# Patient Record
Sex: Male | Born: 1945 | Race: Black or African American | Hispanic: No | Smoking: Former smoker
Health system: Southern US, Community
[De-identification: ages and names within clinical notes are randomized; demographics above are authoritative.]

## PROBLEM LIST (undated history)

## (undated) DIAGNOSIS — G709 Myoneural disorder, unspecified: Secondary | ICD-10-CM

## (undated) DIAGNOSIS — E7800 Pure hypercholesterolemia, unspecified: Secondary | ICD-10-CM

## (undated) DIAGNOSIS — M1990 Unspecified osteoarthritis, unspecified site: Secondary | ICD-10-CM

## (undated) DIAGNOSIS — E119 Type 2 diabetes mellitus without complications: Secondary | ICD-10-CM

## (undated) DIAGNOSIS — I10 Essential (primary) hypertension: Secondary | ICD-10-CM

## (undated) HISTORY — PX: INGUINAL HERNIA REPAIR: SUR1180

## (undated) HISTORY — PX: JOINT REPLACEMENT: SHX530

---

## 1969-03-19 HISTORY — PX: EXCISIONAL HEMORRHOIDECTOMY: SHX1541

## 1997-07-19 HISTORY — PX: TOTAL HIP ARTHROPLASTY: SHX124

## 2000-05-25 ENCOUNTER — Emergency Department (HOSPITAL_COMMUNITY): Admission: EM | Admit: 2000-05-25 | Discharge: 2000-05-25 | Payer: Self-pay | Admitting: Emergency Medicine

## 2000-06-06 ENCOUNTER — Emergency Department (HOSPITAL_COMMUNITY): Admission: EM | Admit: 2000-06-06 | Discharge: 2000-06-06 | Payer: Self-pay | Admitting: Emergency Medicine

## 2002-12-18 ENCOUNTER — Encounter: Admission: RE | Admit: 2002-12-18 | Discharge: 2003-03-18 | Payer: Self-pay | Admitting: Family Medicine

## 2006-03-01 ENCOUNTER — Inpatient Hospital Stay (HOSPITAL_COMMUNITY): Admission: RE | Admit: 2006-03-01 | Discharge: 2006-03-03 | Payer: Self-pay | Admitting: Neurosurgery

## 2006-05-23 ENCOUNTER — Encounter: Admission: RE | Admit: 2006-05-23 | Discharge: 2006-05-23 | Payer: Self-pay | Admitting: Orthopedic Surgery

## 2006-07-19 HISTORY — PX: ANTERIOR CERVICAL DECOMP/DISCECTOMY FUSION: SHX1161

## 2011-11-15 ENCOUNTER — Other Ambulatory Visit: Payer: Self-pay | Admitting: Neurosurgery

## 2011-11-15 DIAGNOSIS — M5000 Cervical disc disorder with myelopathy, unspecified cervical region: Secondary | ICD-10-CM

## 2011-11-18 ENCOUNTER — Ambulatory Visit
Admission: RE | Admit: 2011-11-18 | Discharge: 2011-11-18 | Disposition: A | Discharge status: Home / Self Care | Payer: Medicare Other | Source: Ambulatory Visit | Attending: Neurosurgery | Admitting: Neurosurgery

## 2011-11-18 DIAGNOSIS — M5000 Cervical disc disorder with myelopathy, unspecified cervical region: Secondary | ICD-10-CM

## 2011-11-24 ENCOUNTER — Other Ambulatory Visit: Payer: Self-pay | Admitting: Neurosurgery

## 2011-11-24 DIAGNOSIS — M5000 Cervical disc disorder with myelopathy, unspecified cervical region: Secondary | ICD-10-CM

## 2011-11-25 ENCOUNTER — Other Ambulatory Visit: Payer: Self-pay | Admitting: Neurosurgery

## 2011-11-25 DIAGNOSIS — M5000 Cervical disc disorder with myelopathy, unspecified cervical region: Secondary | ICD-10-CM

## 2011-11-29 ENCOUNTER — Ambulatory Visit
Admission: RE | Admit: 2011-11-29 | Discharge: 2011-11-29 | Disposition: A | Discharge status: Home / Self Care | Payer: Medicare Other | Source: Ambulatory Visit | Attending: Neurosurgery | Admitting: Neurosurgery

## 2011-11-29 DIAGNOSIS — M5000 Cervical disc disorder with myelopathy, unspecified cervical region: Secondary | ICD-10-CM

## 2011-12-06 ENCOUNTER — Other Ambulatory Visit: Payer: Self-pay | Admitting: Neurosurgery

## 2011-12-21 ENCOUNTER — Encounter (HOSPITAL_COMMUNITY): Payer: Self-pay | Admitting: *Deleted

## 2011-12-21 ENCOUNTER — Inpatient Hospital Stay (HOSPITAL_COMMUNITY): Admission: RE | Admit: 2011-12-21 | Payer: Medicare Other | Source: Ambulatory Visit

## 2011-12-21 ENCOUNTER — Encounter (HOSPITAL_COMMUNITY): Payer: Self-pay | Admitting: Pharmacy Technician

## 2011-12-21 NOTE — Pre-Procedure Instructions (Signed)
20 TULLIO CHAUSSE  12/21/2011   Your procedure is scheduled on:  Tuesday, June 18th. :30 Report to [facility] at 5:30 AM.  Call this number if you have problems the morning of surgery: [phone]   Remember:   Do not eat food:After Midnight.  May have clear liquids: up to 4 Hours before arrival. (1:30am)  Clear liquids include soda, tea, black coffee, apple or grape juice, broth.   Take these medicines the morning of surgery with A SIP OF WATER:None   Do not wear jewelry, make-up or nail polish.  Do not wear lotions, powders, or perfumes. You may wear deodorant.  Do not shave 48 hours prior to surgery. Men may shave face and neck.  Do not bring valuables to the hospital.  Contacts, dentures or bridgework may not be worn into surgery.  Leave suitcase in the car. After surgery it may be brought to your room.  For patients admitted to the hospital, checkout time is 11:00 AM the day of discharge.   Patients discharged the day of surgery will not be allowed to drive home.  Name and phone number of your driver: NA  Special Instructions: CHG Shower Use Special Wash: 1/2 bottle night before surgery and 1/2 bottle morning of surgery.   Please read over the following fact sheets that you were given: Pain Booklet, Coughing and Deep Breathing, Blood Transfusion Information, MRSA Information and Surgical Site Infection Prevention

## 2011-12-28 ENCOUNTER — Ambulatory Visit (HOSPITAL_COMMUNITY)
Admission: RE | Admit: 2011-12-28 | Discharge: 2011-12-28 | Disposition: A | Discharge status: Home / Self Care | Payer: Medicare Other | Source: Ambulatory Visit | Attending: Neurosurgery | Admitting: Neurosurgery

## 2011-12-28 ENCOUNTER — Encounter (HOSPITAL_COMMUNITY)
Admission: RE | Admit: 2011-12-28 | Discharge: 2011-12-28 | Disposition: A | Discharge status: Home / Self Care | Payer: Medicare Other | Source: Ambulatory Visit | Attending: Neurosurgery | Admitting: Neurosurgery

## 2011-12-28 DIAGNOSIS — Z01818 Encounter for other preprocedural examination: Secondary | ICD-10-CM | POA: Insufficient documentation

## 2011-12-28 DIAGNOSIS — Z01812 Encounter for preprocedural laboratory examination: Secondary | ICD-10-CM | POA: Insufficient documentation

## 2011-12-28 LAB — TYPE AND SCREEN
ABO/RH(D): A POS
Antibody Screen: NEGATIVE

## 2011-12-28 LAB — SURGICAL PCR SCREEN: Staphylococcus aureus: NEGATIVE

## 2011-12-28 LAB — BASIC METABOLIC PANEL
BUN: 14 mg/dL (ref 6–23)
Calcium: 9.9 mg/dL (ref 8.4–10.5)
Chloride: 103 mEq/L (ref 96–112)
Creatinine, Ser: 0.71 mg/dL (ref 0.50–1.35)
GFR calc Af Amer: >90 mL/min (ref >90)
GFR calc non Af Amer: >90 mL/min (ref >90)

## 2011-12-28 LAB — CBC
HCT: 40.7 % (ref 39.0–52.0)
MCHC: 33.4 g/dL (ref 30.0–36.0)
Platelets: 262 10*3/uL (ref 150–400)
RDW: 14 % (ref 11.5–15.5)

## 2012-01-04 ENCOUNTER — Inpatient Hospital Stay (HOSPITAL_COMMUNITY): Payer: Medicare Other

## 2012-01-04 ENCOUNTER — Inpatient Hospital Stay (HOSPITAL_COMMUNITY): Payer: Medicare Other | Admitting: Certified Registered"

## 2012-01-04 ENCOUNTER — Encounter (HOSPITAL_COMMUNITY)
Admission: RE | Disposition: A | Discharge status: Rehabilitation Facility | Payer: Self-pay | Source: Ambulatory Visit | Attending: Neurosurgery

## 2012-01-04 ENCOUNTER — Inpatient Hospital Stay: Admit: 2012-01-04 | Payer: Self-pay | Admitting: Neurosurgery

## 2012-01-04 ENCOUNTER — Encounter (HOSPITAL_COMMUNITY): Payer: Self-pay | Admitting: *Deleted

## 2012-01-04 ENCOUNTER — Encounter (HOSPITAL_COMMUNITY): Payer: Self-pay | Admitting: Certified Registered"

## 2012-01-04 ENCOUNTER — Inpatient Hospital Stay (HOSPITAL_COMMUNITY)
Admission: RE | Admit: 2012-01-04 | Discharge: 2012-01-12 | DRG: 471 | Disposition: A | Discharge status: Rehabilitation Facility | Payer: Medicare Other | Source: Ambulatory Visit | Attending: Neurosurgery | Admitting: Neurosurgery

## 2012-01-04 DIAGNOSIS — J95821 Acute postprocedural respiratory failure: Secondary | ICD-10-CM | POA: Diagnosis not present

## 2012-01-04 DIAGNOSIS — Z96649 Presence of unspecified artificial hip joint: Secondary | ICD-10-CM

## 2012-01-04 DIAGNOSIS — Z87891 Personal history of nicotine dependence: Secondary | ICD-10-CM

## 2012-01-04 DIAGNOSIS — Y9210 Unspecified residential institution as the place of occurrence of the external cause: Secondary | ICD-10-CM | POA: Diagnosis not present

## 2012-01-04 DIAGNOSIS — E875 Hyperkalemia: Secondary | ICD-10-CM

## 2012-01-04 DIAGNOSIS — Z981 Arthrodesis status: Secondary | ICD-10-CM

## 2012-01-04 DIAGNOSIS — E119 Type 2 diabetes mellitus without complications: Secondary | ICD-10-CM | POA: Diagnosis present

## 2012-01-04 DIAGNOSIS — R0902 Hypoxemia: Secondary | ICD-10-CM

## 2012-01-04 DIAGNOSIS — IMO0002 Reserved for concepts with insufficient information to code with codable children: Secondary | ICD-10-CM | POA: Diagnosis not present

## 2012-01-04 DIAGNOSIS — Y831 Surgical operation with implant of artificial internal device as the cause of abnormal reaction of the patient, or of later complication, without mention of misadventure at the time of the procedure: Secondary | ICD-10-CM | POA: Diagnosis not present

## 2012-01-04 DIAGNOSIS — E785 Hyperlipidemia, unspecified: Secondary | ICD-10-CM | POA: Diagnosis present

## 2012-01-04 DIAGNOSIS — I10 Essential (primary) hypertension: Secondary | ICD-10-CM | POA: Diagnosis present

## 2012-01-04 DIAGNOSIS — Z79899 Other long term (current) drug therapy: Secondary | ICD-10-CM

## 2012-01-04 DIAGNOSIS — Z23 Encounter for immunization: Secondary | ICD-10-CM

## 2012-01-04 DIAGNOSIS — R531 Weakness: Secondary | ICD-10-CM

## 2012-01-04 DIAGNOSIS — M4712 Other spondylosis with myelopathy, cervical region: Principal | ICD-10-CM | POA: Diagnosis present

## 2012-01-04 HISTORY — DX: Myoneural disorder, unspecified: G70.9

## 2012-01-04 HISTORY — DX: Pure hypercholesterolemia, unspecified: E78.00

## 2012-01-04 HISTORY — DX: Essential (primary) hypertension: I10

## 2012-01-04 HISTORY — PX: POSTERIOR FUSION CERVICAL SPINE: SUR628

## 2012-01-04 HISTORY — PX: POSTERIOR CERVICAL LAMINECTOMY: SHX2248

## 2012-01-04 HISTORY — DX: Unspecified osteoarthritis, unspecified site: M19.90

## 2012-01-04 HISTORY — DX: Type 2 diabetes mellitus without complications: E11.9

## 2012-01-04 LAB — GLUCOSE, CAPILLARY
Glucose-Capillary: 131 mg/dL — ABNORMAL HIGH (ref 70–99)
Glucose-Capillary: 137 mg/dL — ABNORMAL HIGH (ref 70–99)

## 2012-01-04 SURGERY — POSTERIOR CERVICAL LAMINECTOMY
Anesthesia: General | Site: Spine Cervical | Wound class: Clean

## 2012-01-04 MED ORDER — LACTATED RINGERS IV SOLN
INTRAVENOUS | Status: DC | PRN
  Administered 2012-01-04 (×4): via INTRAVENOUS

## 2012-01-04 MED ORDER — GLIPIZIDE 5 MG PO TABS
5.0000 mg | ORAL_TABLET | Freq: Two times a day (BID) | ORAL | Status: DC
  Administered 2012-01-04 – 2012-01-09 (×10): 5 mg via ORAL
  Filled 2012-01-04 (×12): qty 1

## 2012-01-04 MED ORDER — SODIUM CHLORIDE 0.9 % IJ SOLN
3.0000 mL | Freq: Two times a day (BID) | INTRAMUSCULAR | Status: DC
  Administered 2012-01-05: 10:00:00 via INTRAVENOUS
  Administered 2012-01-05 – 2012-01-09 (×8): 3 mL via INTRAVENOUS

## 2012-01-04 MED ORDER — SODIUM CHLORIDE 0.9 % IV SOLN
INTRAVENOUS | Status: AC
  Filled 2012-01-04: qty 500

## 2012-01-04 MED ORDER — HYDROMORPHONE HCL PF 1 MG/ML IJ SOLN: 0.2500 mg | INTRAMUSCULAR | Status: DC | PRN

## 2012-01-04 MED ORDER — PROMETHAZINE HCL 25 MG PO TABS: 12.5000 mg | ORAL_TABLET | ORAL | Status: DC | PRN

## 2012-01-04 MED ORDER — ONDANSETRON HCL 4 MG/2ML IJ SOLN
INTRAMUSCULAR | Status: DC | PRN
  Administered 2012-01-04: 4 mg via INTRAVENOUS

## 2012-01-04 MED ORDER — MAGNESIUM HYDROXIDE 400 MG/5ML PO SUSP
30.0000 mL | Freq: Every day | ORAL | Status: DC | PRN
  Administered 2012-01-07: 30 mL via ORAL
  Filled 2012-01-04: qty 30

## 2012-01-04 MED ORDER — METHOCARBAMOL 500 MG PO TABS
500.0000 mg | ORAL_TABLET | Freq: Four times a day (QID) | ORAL | Status: DC | PRN
  Administered 2012-01-06: 500 mg via ORAL
  Filled 2012-01-04 (×2): qty 1

## 2012-01-04 MED ORDER — HEMOSTATIC AGENTS (NO CHARGE) OPTIME
TOPICAL | Status: DC | PRN
  Administered 2012-01-04 (×2): 1 via TOPICAL

## 2012-01-04 MED ORDER — 0.9 % SODIUM CHLORIDE (POUR BTL) OPTIME
TOPICAL | Status: DC | PRN
  Administered 2012-01-04: 1000 mL

## 2012-01-04 MED ORDER — EPHEDRINE SULFATE 50 MG/ML IJ SOLN
INTRAMUSCULAR | Status: DC | PRN
  Administered 2012-01-04 (×3): 10 mg via INTRAVENOUS

## 2012-01-04 MED ORDER — POTASSIUM CHLORIDE IN NACL 20-0.45 MEQ/L-% IV SOLN
INTRAVENOUS | Status: DC
  Administered 2012-01-04 – 2012-01-05 (×2): via INTRAVENOUS
  Filled 2012-01-04 (×14): qty 1000

## 2012-01-04 MED ORDER — CYCLOBENZAPRINE HCL 10 MG PO TABS
10.0000 mg | ORAL_TABLET | Freq: Three times a day (TID) | ORAL | Status: DC | PRN
  Administered 2012-01-11 – 2012-01-12 (×3): 10 mg via ORAL
  Filled 2012-01-04 (×3): qty 1

## 2012-01-04 MED ORDER — SODIUM CHLORIDE 0.9 % IJ SOLN
3.0000 mL | INTRAMUSCULAR | Status: DC | PRN
  Administered 2012-01-09: 3 mL via INTRAVENOUS

## 2012-01-04 MED ORDER — MIDAZOLAM HCL 5 MG/5ML IJ SOLN
INTRAMUSCULAR | Status: DC | PRN
  Administered 2012-01-04: 2 mg via INTRAVENOUS

## 2012-01-04 MED ORDER — LIDOCAINE-EPINEPHRINE 1 %-1:100000 IJ SOLN
INTRAMUSCULAR | Status: DC | PRN
  Administered 2012-01-04: 20 mL

## 2012-01-04 MED ORDER — CEFAZOLIN SODIUM 1-5 GM-% IV SOLN
INTRAVENOUS | Status: AC
  Filled 2012-01-04: qty 50

## 2012-01-04 MED ORDER — SIMVASTATIN 20 MG PO TABS
20.0000 mg | ORAL_TABLET | Freq: Every day | ORAL | Status: DC
  Administered 2012-01-04 – 2012-01-11 (×7): 20 mg via ORAL
  Filled 2012-01-04 (×9): qty 1

## 2012-01-04 MED ORDER — THROMBIN 5000 UNITS EX KIT
PACK | CUTANEOUS | Status: DC | PRN
  Administered 2012-01-04 (×4): 5000 [IU] via TOPICAL

## 2012-01-04 MED ORDER — ONDANSETRON HCL 4 MG/2ML IJ SOLN: 4.0000 mg | Freq: Four times a day (QID) | INTRAMUSCULAR | Status: DC | PRN

## 2012-01-04 MED ORDER — BACITRACIN 50000 UNITS IM SOLR
INTRAMUSCULAR | Status: AC
  Filled 2012-01-04: qty 1

## 2012-01-04 MED ORDER — MORPHINE SULFATE (PF) 1 MG/ML IV SOLN
INTRAVENOUS | Status: DC
  Administered 2012-01-04: 14:00:00 via INTRAVENOUS
  Administered 2012-01-04: 7.5 mg via INTRAVENOUS
  Administered 2012-01-04: 1.7 mg via INTRAVENOUS
  Administered 2012-01-04: 3 mg via INTRAVENOUS
  Administered 2012-01-05: 7.5 mg via INTRAVENOUS

## 2012-01-04 MED ORDER — LIDOCAINE HCL (CARDIAC) 20 MG/ML IV SOLN
INTRAVENOUS | Status: DC | PRN
  Administered 2012-01-04: 80 mg via INTRAVENOUS

## 2012-01-04 MED ORDER — CEFAZOLIN SODIUM 1-5 GM-% IV SOLN
1.0000 g | Freq: Three times a day (TID) | INTRAVENOUS | Status: AC
  Administered 2012-01-04 – 2012-01-05 (×4): 1 g via INTRAVENOUS
  Filled 2012-01-04 (×4): qty 50

## 2012-01-04 MED ORDER — PHENYLEPHRINE HCL 10 MG/ML IJ SOLN
INTRAMUSCULAR | Status: DC | PRN
  Administered 2012-01-04 (×11): 80 ug via INTRAVENOUS

## 2012-01-04 MED ORDER — ROCURONIUM BROMIDE 100 MG/10ML IV SOLN
INTRAVENOUS | Status: DC | PRN
  Administered 2012-01-04: 50 mg via INTRAVENOUS
  Administered 2012-01-04: 10 mg via INTRAVENOUS

## 2012-01-04 MED ORDER — BISACODYL 10 MG RE SUPP: 10.0000 mg | Freq: Every day | RECTAL | Status: DC | PRN

## 2012-01-04 MED ORDER — SODIUM CHLORIDE 0.9 % IR SOLN
Status: DC | PRN
  Administered 2012-01-04: 10:00:00

## 2012-01-04 MED ORDER — INSULIN ASPART 100 UNIT/ML ~~LOC~~ SOLN
0.0000 [IU] | SUBCUTANEOUS | Status: DC
Start: 2012-01-04 — End: 2012-01-05
  Administered 2012-01-04 (×2): 2 [IU] via SUBCUTANEOUS
  Administered 2012-01-05 (×3): 3 [IU] via SUBCUTANEOUS
  Filled 2012-01-04: qty 3
  Filled 2012-01-04: qty 0.15

## 2012-01-04 MED ORDER — BACITRACIN ZINC 500 UNIT/GM EX OINT
TOPICAL_OINTMENT | CUTANEOUS | Status: DC | PRN
  Administered 2012-01-04: 1 via TOPICAL

## 2012-01-04 MED ORDER — LISINOPRIL 10 MG PO TABS
10.0000 mg | ORAL_TABLET | Freq: Every day | ORAL | Status: DC
  Administered 2012-01-04 – 2012-01-09 (×6): 10 mg via ORAL
  Filled 2012-01-04 (×6): qty 1

## 2012-01-04 MED ORDER — DIPHENHYDRAMINE HCL 12.5 MG/5ML PO ELIX: 12.5000 mg | ORAL_SOLUTION | Freq: Four times a day (QID) | ORAL | Status: DC | PRN

## 2012-01-04 MED ORDER — CEFAZOLIN SODIUM 1-5 GM-% IV SOLN
INTRAVENOUS | Status: DC | PRN
  Administered 2012-01-04 (×2): 1 g via INTRAVENOUS

## 2012-01-04 MED ORDER — DIAZEPAM 5 MG PO TABS
5.0000 mg | ORAL_TABLET | Freq: Four times a day (QID) | ORAL | Status: DC | PRN
  Administered 2012-01-05 – 2012-01-09 (×7): 5 mg via ORAL
  Filled 2012-01-04 (×7): qty 1

## 2012-01-04 MED ORDER — LIDOCAINE HCL 4 % MT SOLN
OROMUCOSAL | Status: DC | PRN
  Administered 2012-01-04: 4 mL via TOPICAL

## 2012-01-04 MED ORDER — METHOCARBAMOL 100 MG/ML IJ SOLN
500.0000 mg | Freq: Four times a day (QID) | INTRAVENOUS | Status: DC | PRN
  Filled 2012-01-04: qty 5

## 2012-01-04 MED ORDER — METFORMIN HCL 500 MG PO TABS
1000.0000 mg | ORAL_TABLET | Freq: Two times a day (BID) | ORAL | Status: DC
  Administered 2012-01-04 – 2012-01-09 (×10): 1000 mg via ORAL
  Filled 2012-01-04 (×12): qty 2

## 2012-01-04 MED ORDER — SUFENTANIL CITRATE 50 MCG/ML IV SOLN
INTRAVENOUS | Status: DC | PRN
  Administered 2012-01-04: 15 ug via INTRAVENOUS
  Administered 2012-01-04 (×2): 10 ug via INTRAVENOUS
  Administered 2012-01-04: 5 ug via INTRAVENOUS
  Administered 2012-01-04: 10 ug via INTRAVENOUS

## 2012-01-04 MED ORDER — NEOSTIGMINE METHYLSULFATE 1 MG/ML IJ SOLN
INTRAMUSCULAR | Status: DC | PRN
  Administered 2012-01-04: 5 mg via INTRAVENOUS

## 2012-01-04 MED ORDER — SODIUM CHLORIDE 0.9 % IV SOLN: 250.0000 mL | INTRAVENOUS | Status: DC

## 2012-01-04 MED ORDER — KETOROLAC TROMETHAMINE 30 MG/ML IJ SOLN
30.0000 mg | Freq: Four times a day (QID) | INTRAMUSCULAR | Status: AC
  Administered 2012-01-04 – 2012-01-05 (×4): 30 mg via INTRAVENOUS
  Filled 2012-01-04 (×3): qty 1

## 2012-01-04 MED ORDER — KETOROLAC TROMETHAMINE 30 MG/ML IJ SOLN
INTRAMUSCULAR | Status: AC
  Filled 2012-01-04: qty 1

## 2012-01-04 MED ORDER — ZOLPIDEM TARTRATE 5 MG PO TABS: 5.0000 mg | ORAL_TABLET | Freq: Every evening | ORAL | Status: DC | PRN

## 2012-01-04 MED ORDER — MORPHINE SULFATE (PF) 1 MG/ML IV SOLN
INTRAVENOUS | Status: AC
  Filled 2012-01-04: qty 25

## 2012-01-04 MED ORDER — NALOXONE HCL 0.4 MG/ML IJ SOLN: 0.4000 mg | INTRAMUSCULAR | Status: DC | PRN

## 2012-01-04 MED ORDER — GLYCOPYRROLATE 0.2 MG/ML IJ SOLN
INTRAMUSCULAR | Status: DC | PRN
  Administered 2012-01-04: .8 mg via INTRAVENOUS

## 2012-01-04 MED ORDER — ACETAMINOPHEN 325 MG PO TABS: 650.0000 mg | ORAL_TABLET | ORAL | Status: DC | PRN

## 2012-01-04 MED ORDER — PROPOFOL 10 MG/ML IV EMUL
INTRAVENOUS | Status: DC | PRN
  Administered 2012-01-04: 160 mg via INTRAVENOUS
  Administered 2012-01-04: 40 mg via INTRAVENOUS

## 2012-01-04 MED ORDER — ONDANSETRON HCL 4 MG/2ML IJ SOLN: 4.0000 mg | INTRAMUSCULAR | Status: DC | PRN

## 2012-01-04 MED ORDER — DOCUSATE SODIUM 100 MG PO CAPS
100.0000 mg | ORAL_CAPSULE | Freq: Two times a day (BID) | ORAL | Status: DC
  Administered 2012-01-04 – 2012-01-12 (×13): 100 mg via ORAL
  Filled 2012-01-04 (×14): qty 1

## 2012-01-04 MED ORDER — PROMETHAZINE HCL 25 MG/ML IJ SOLN
12.5000 mg | INTRAMUSCULAR | Status: DC | PRN
  Filled 2012-01-04: qty 1

## 2012-01-04 MED ORDER — ACETAMINOPHEN 650 MG RE SUPP: 650.0000 mg | RECTAL | Status: DC | PRN

## 2012-01-04 MED ORDER — SODIUM CHLORIDE 0.9 % IJ SOLN: 9.0000 mL | INTRAMUSCULAR | Status: DC | PRN

## 2012-01-04 MED ORDER — DIPHENHYDRAMINE HCL 50 MG/ML IJ SOLN: 12.5000 mg | Freq: Four times a day (QID) | INTRAMUSCULAR | Status: DC | PRN

## 2012-01-04 SURGICAL SUPPLY — 79 items
150 MM ROD TEMPLATE ×1 IMPLANT
APL SKNCLS STERI-STRIP NONHPOA (GAUZE/BANDAGES/DRESSINGS) ×1
BAG DECANTER FOR FLEXI CONT (MISCELLANEOUS) ×2 IMPLANT
BENZOIN TINCTURE PRP APPL 2/3 (GAUZE/BANDAGES/DRESSINGS) ×2 IMPLANT
BIT DRILL FIX DEPTH 18MM (DRILL) IMPLANT
BIT DRILL NEURO 2X3.1 SFT TUCH (MISCELLANEOUS) ×1 IMPLANT
BRUSH SCRUB EZ 1% IODOPHOR (MISCELLANEOUS) ×1 IMPLANT
BUR ROUND FLUTED 4 SOFT TCH (BURR) ×1 IMPLANT
CANISTER SUCTION 2500CC (MISCELLANEOUS) ×2 IMPLANT
CLOTH BEACON ORANGE TIMEOUT ST (SAFETY) ×2 IMPLANT
CONT SPEC 4OZ CLIKSEAL STRL BL (MISCELLANEOUS) ×3 IMPLANT
DRAIN SNY WOU 7FLT (WOUND CARE) IMPLANT
DRAPE C-ARM 42X72 X-RAY (DRAPES) ×2 IMPLANT
DRAPE C-ARMOR (DRAPES) ×1 IMPLANT
DRAPE LAPAROTOMY 100X72 PEDS (DRAPES) ×2 IMPLANT
DRAPE MICROSCOPE LEICA (MISCELLANEOUS) IMPLANT
DRAPE ORTHO SPLIT 77X108 STRL (DRAPES)
DRAPE POUCH INSTRU U-SHP 10X18 (DRAPES) ×2 IMPLANT
DRAPE SURG ORHT 6 SPLT 77X108 (DRAPES) IMPLANT
DRESSING TELFA 8X3 (GAUZE/BANDAGES/DRESSINGS) ×1 IMPLANT
DRILL 12MM (DRILL) ×1 IMPLANT
DRILL 16MM (DRILL) ×1 IMPLANT
DRILL FIX DEPTH 18MM (DRILL) ×2
DRILL NEURO 2X3.1 SOFT TOUCH (MISCELLANEOUS) ×2
DURAPREP 26ML APPLICATOR (WOUND CARE) ×1 IMPLANT
DURAPREP 6ML APPLICATOR 50/CS (WOUND CARE) ×1 IMPLANT
ELECT REM PT RETURN 9FT ADLT (ELECTROSURGICAL) ×2
ELECTRODE REM PT RTRN 9FT ADLT (ELECTROSURGICAL) ×1 IMPLANT
EVACUATOR SILICONE 100CC (DRAIN) IMPLANT
GAUZE SPONGE 4X4 16PLY XRAY LF (GAUZE/BANDAGES/DRESSINGS) IMPLANT
GLOVE ECLIPSE 7.5 STRL STRAW (GLOVE) ×4 IMPLANT
GLOVE ECLIPSE 8.5 STRL (GLOVE) ×1 IMPLANT
GLOVE EXAM NITRILE LRG STRL (GLOVE) IMPLANT
GLOVE EXAM NITRILE MD LF STRL (GLOVE) ×1 IMPLANT
GLOVE EXAM NITRILE XL STR (GLOVE) IMPLANT
GLOVE EXAM NITRILE XS STR PU (GLOVE) IMPLANT
GLOVE INDICATOR 7.0 STRL GRN (GLOVE) ×3 IMPLANT
GLOVE INDICATOR 7.5 STRL GRN (GLOVE) ×3 IMPLANT
GLOVE SURG SS PI 6.5 STRL IVOR (GLOVE) ×4 IMPLANT
GLOVE SURG SS PI 7.5 STRL IVOR (GLOVE) ×2 IMPLANT
GOWN BRE IMP SLV AUR LG STRL (GOWN DISPOSABLE) ×3 IMPLANT
GOWN BRE IMP SLV AUR XL STRL (GOWN DISPOSABLE) ×4 IMPLANT
GOWN STRL REIN 2XL LVL4 (GOWN DISPOSABLE) ×2 IMPLANT
KIT BASIN OR (CUSTOM PROCEDURE TRAY) ×2 IMPLANT
KIT ROOM TURNOVER OR (KITS) ×2 IMPLANT
NDL HYPO 18GX1.5 BLUNT FILL (NEEDLE) IMPLANT
NDL SPNL 22GX3.5 QUINCKE BK (NEEDLE) ×1 IMPLANT
NEEDLE HYPO 18GX1.5 BLUNT FILL (NEEDLE) IMPLANT
NEEDLE HYPO 22GX1.5 SAFETY (NEEDLE) ×3 IMPLANT
NEEDLE SPNL 22GX3.5 QUINCKE BK (NEEDLE) ×2 IMPLANT
NS IRRIG 1000ML POUR BTL (IV SOLUTION) ×2 IMPLANT
PACK LAMINECTOMY NEURO (CUSTOM PROCEDURE TRAY) ×2 IMPLANT
PAD ARMBOARD 7.5X6 YLW CONV (MISCELLANEOUS) ×6 IMPLANT
PATTIES SURGICAL .25X.25 (GAUZE/BANDAGES/DRESSINGS) IMPLANT
PATTIES SURGICAL .75X.75 (GAUZE/BANDAGES/DRESSINGS) ×1 IMPLANT
PIN MAYFIELD SKULL DISP (PIN) ×1 IMPLANT
ROD 120MM (Rod) ×4 IMPLANT
ROD SPNL 120X3.3XNS LF CVD (Rod) IMPLANT
RUBBERBAND STERILE (MISCELLANEOUS) IMPLANT
SCREW POLYAXIAL 12MM (Screw) ×7 IMPLANT
SCREW POLYAXIAL 3.5X16MM (Screw) ×1 IMPLANT
SCREW SET (Screw) ×10 IMPLANT
SENSORCAINE 0.25% ×1 IMPLANT
SPONGE GAUZE 4X4 12PLY (GAUZE/BANDAGES/DRESSINGS) ×1 IMPLANT
STRIP CLOSURE SKIN 1/2X4 (GAUZE/BANDAGES/DRESSINGS) ×1 IMPLANT
SUT NURALON 4 0 TR CR/8 (SUTURE) IMPLANT
SUT VIC AB 0 CT1 18XCR BRD8 (SUTURE) ×1 IMPLANT
SUT VIC AB 0 CT1 8-18 (SUTURE) ×4
SUT VIC AB 2-0 CP2 18 (SUTURE) ×2 IMPLANT
SUT VIC AB 3-0 SH 8-18 (SUTURE) ×3 IMPLANT
SYR 20ML ECCENTRIC (SYRINGE) ×2 IMPLANT
SYR 3ML LL SCALE MARK (SYRINGE) IMPLANT
TAP 3.0 ×1 IMPLANT
TAPE CLOTH SURG 4X10 WHT LF (GAUZE/BANDAGES/DRESSINGS) ×1 IMPLANT
THORACIC TAPS 3.0 MM ×1 IMPLANT
TOWEL OR 17X24 6PK STRL BLUE (TOWEL DISPOSABLE) ×2 IMPLANT
TOWEL OR 17X26 10 PK STRL BLUE (TOWEL DISPOSABLE) ×2 IMPLANT
TRAY FOLEY CATH 14FRSI W/METER (CATHETERS) ×1 IMPLANT
WATER STERILE IRR 1000ML POUR (IV SOLUTION) ×2 IMPLANT

## 2012-01-04 NOTE — Progress Notes (Signed)
Patient alert and oriented. PCA in place with CO2 and pulse ox monitor. Ted hose bilaterally with SCDs. Urinary frequency: urine clear yellow, denies painful urination. Call to physician by day shift regarding bleeding from neck incision. Dry red blood noted upon initial assessment. Neck brace remains in place.

## 2012-01-04 NOTE — H&P (Signed)
See H& P.

## 2012-01-04 NOTE — Preoperative (Signed)
Beta Blockers   Reason not to administer Beta Blockers:Not Applicable 

## 2012-01-04 NOTE — Progress Notes (Signed)
Have noticed active bleeding from the surgical site, the pillow cover and bed sheet at his back was socked with blood. Informed Dr.Hirsch and reinforced the dressing by maintaining all aseptic technique. Provide psychological support for the patient, he is relaxed. No other concerns.

## 2012-01-04 NOTE — Anesthesia Procedure Notes (Signed)
Procedure Name: Intubation Date/Time: 01/04/2012 8:30 AM Performed by: Glendora Score A Pre-anesthesia Checklist: Patient identified, Emergency Drugs available, Suction available and Patient being monitored Patient Re-evaluated:Patient Re-evaluated prior to inductionOxygen Delivery Method: Circle system utilized Preoxygenation: Pre-oxygenation with 100% oxygen Intubation Type: IV induction Ventilation: Mask ventilation without difficulty and Oral airway inserted - appropriate to patient size Laryngoscope Size: Hyacinth Meeker and 2 Grade View: Grade I Tube type: Oral Tube size: 7.5 mm Number of attempts: 1 Airway Equipment and Method: Stylet and LTA kit utilized Placement Confirmation: ETT inserted through vocal cords under direct vision,  positive ETCO2 and breath sounds checked- equal and bilateral Secured at: 23 cm Tube secured with: Tape Dental Injury: Teeth and Oropharynx as per pre-operative assessment

## 2012-01-04 NOTE — Interval H&P Note (Signed)
History and Physical Interval Note:  01/04/2012 7:49 AM  [name]  has presented today for surgery, with the diagnosis of Cervical hnp with myelopathy  The various methods of treatment have been discussed with the patient and family. After consideration of risks, benefits and other options for treatment, the patient has consented to  Procedure(s) (LRB): POSTERIOR CERVICAL LAMINECTOMY (N/A) as a surgical intervention .  The patient's history has been reviewed, patient examined, no change in status, stable for surgery.  I have reviewed the patients' chart and labs.  Questions were answered to the patient's satisfaction.     [name] R

## 2012-01-04 NOTE — Anesthesia Preprocedure Evaluation (Addendum)
Anesthesia Evaluation  Patient identified by MRN, date of birth, ID band Patient awake    Reviewed: Allergy & Precautions, H&P , NPO status , Patient's Chart, lab work & pertinent test results, reviewed documented beta blocker date and time   Airway Mallampati: I TM Distance: >3 FB Neck ROM: Full    Dental  (+) Edentulous Upper and Partial Lower   Pulmonary neg pulmonary ROS,  breath sounds clear to auscultation        Cardiovascular hypertension, Pt. on medications Rhythm:Regular Rate:Normal     Neuro/Psych    GI/Hepatic Neg liver ROS,   Endo/Other  Diabetes mellitus-, Well Controlled, Type 2, Oral Hypoglycemic Agents  Renal/GU negative Renal ROS     Musculoskeletal   Abdominal   Peds  Hematology negative hematology ROS (+)   Anesthesia Other Findings   Reproductive/Obstetrics                          Anesthesia Physical Anesthesia Plan  ASA: II  Anesthesia Plan: General   Post-op Pain Management:    Induction: Intravenous  Airway Management Planned: Oral ETT  Additional Equipment:   Intra-op Plan:   Post-operative Plan: Extubation in OR  Informed Consent: I have reviewed the patients History and Physical, chart, labs and discussed the procedure including the risks, benefits and alternatives for the proposed anesthesia with the patient or authorized representative who has indicated his/her understanding and acceptance.   Dental advisory given  Plan Discussed with: CRNA, Anesthesiologist and Surgeon  Anesthesia Plan Comments:         Anesthesia Quick Evaluation

## 2012-01-04 NOTE — Transfer of Care (Signed)
Immediate Anesthesia Transfer of Care Note  Patient: [name]  Procedure(s) Performed: Procedure(s) (LRB): POSTERIOR CERVICAL LAMINECTOMY (N/A)  Patient Location: PACU  Anesthesia Type: General  Level of Consciousness: awake, alert  and patient cooperative  Airway & Oxygen Therapy: Patient Spontanous Breathing and Patient connected to face mask oxygen  Post-op Assessment: Report given to PACU RN  Post vital signs: Reviewed and stable  Complications: No apparent anesthesia complications

## 2012-01-04 NOTE — Progress Notes (Signed)
Call to Dr. Jordan Likes due to copious bleeding from incision site even after reinforcement of dressing. Per his order to take down dressing and reapply a pressure dressing. CBC ordered. Will continue to monitor. [name]

## 2012-01-04 NOTE — Progress Notes (Signed)
Orders not signed by Dr. Blanche East, paged him.

## 2012-01-04 NOTE — Op Note (Signed)
01/04/2012  1:25 PM  PATIENT:  [name]  66 y.o. male  PRE-OPERATIVE DIAGNOSIS:  Cervical stenosis with myelopathy  POST-OPERATIVE DIAGNOSIS:  Cervical stenosis with Myelopathy, Cervical Stenosis  PROCEDURE:  Procedure(s): POSTERIOR CERVICAL LAMINECTOMY C2-T1,inclusive (6 levels) , posterior fusion, C2-T1 (6 levels), segmented screw instumentation, autograft  SURGEON:  Surgeon(s): Clydene Fake, MD Temple Pacini, MD-assist   ANESTHESIA:   general  EBL:  Total I/O In: 3000 [I.V.:3000] Out: 1150 [Urine:700; Blood:450]  BLOOD ADMINISTERED:none  DRAINS: none   SPECIMEN:  No Specimen  DICTATION: Patient with neck pain and progressive arm weakness and progressive myelopathy MRI and CT done 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   Patient brought into operating room general anesthesia induced patient placed in [place] head pins has not prone position with all pressure points padded. The fluoroscopy unit and physician patient prepped draped in a sterile fashion. Slight incision was injected with 20 cc 1% lidocaine with epinephrine an incision was then made in the midline cervical spine from C1 to top T2 incision taken down to the fascia subperiosteal dissection done over the spinous process and lamina we could see C2 through no discharge through T1. Soaking tractors were placed.  Using a Doppler marked amount screws were placed in the C3-7 bilaterally skipping one level and each side but alternating that and we used the awl to orders per 4 we then drilled to drill hole and upward outward to angle the capital then placed lateral mass screw. A partial laminectomy was then done with [place] rongeurs high-speed drill and  Kerrison punches removing the bottom half of the C2 spinous process lamina we then to the top half of the T1 spinous process lamina dissected out laterally and actually did decompression of the central canal spinal cord. Using fluoroscopy and intraoperative landmarks and going for a right tennis CT of pedicle screws and found decorticated the high-speed drill and then and there was used to drill into the pedicle towards the vertebral body using fluoroscopy as a guide we checked the hole with good bony circumference was going down. And a 60 mm screws placed in the C2 pedicle probe the pedicle was explore the right side and we the rods and the right side a good stabilization and we did not place a screw into the left C2 pedicle we then placed screws bilaterally into the T1 the pedicle using fluoroscopy imaging and 2 and intraocular marks finder the pedicle drilled on the pedicle into the vertebral bodies this-screws bilaterally. Roger then sized and placed in the screw heads from C2-T1 on the right C3-T1 on the left locking nuts were placed and these were final tightened. We did stabilization spine. High-speed drill was used to decorticate the lateral masses of C2-T1 on graft bone all the bone removed during the laminectomy which was cleaned from soft tissue chart the small pieces and this bone the autograft bone was to placed posterior laterally for posterior fusion C2-T1. Retractors removed we did hemostasis fascia closed with interrupted sutures subcutaneous tissue closed with 02 over 0 Vicryl interrupted sutures skin closed with staplers dressing was placed patient was placed back in spine position were removed from head pins transferred recovery PLAN OF CARE: Admit to inpatient   PATIENT DISPOSITION:  PACU - hemodynamically stable.

## 2012-01-04 NOTE — Anesthesia Postprocedure Evaluation (Signed)
  Anesthesia Post-op Note  Patient: [name]  Procedure(s) Performed: Procedure(s) (LRB): POSTERIOR CERVICAL LAMINECTOMY (N/A)  Patient Location: PACU  Anesthesia Type: General  Level of Consciousness: awake  Airway and Oxygen Therapy: Patient Spontanous Breathing  Post-op Pain: mild  Post-op Assessment: Post-op Vital signs reviewed  Post-op Vital Signs: Reviewed  Complications: No apparent anesthesia complications

## 2012-01-05 ENCOUNTER — Encounter (HOSPITAL_COMMUNITY): Payer: Self-pay | Admitting: Neurosurgery

## 2012-01-05 LAB — GLUCOSE, CAPILLARY
Glucose-Capillary: 90 mg/dL (ref 70–99)
Glucose-Capillary: 152 mg/dL — ABNORMAL HIGH (ref 70–99)
Glucose-Capillary: 173 mg/dL — ABNORMAL HIGH (ref 70–99)
Glucose-Capillary: 103 mg/dL — ABNORMAL HIGH (ref 70–99)

## 2012-01-05 LAB — CBC
HCT: 36.4 % — ABNORMAL LOW (ref 39.0–52.0)
Hemoglobin: 12.1 g/dL — ABNORMAL LOW (ref 13.0–17.0)
MCH: 29.8 pg (ref 26.0–34.0)
MCHC: 33.2 g/dL (ref 30.0–36.0)
MCV: 89.7 fL (ref 78.0–100.0)
Platelets: 227 10*3/uL (ref 150–400)
RBC: 4.06 MIL/uL — ABNORMAL LOW (ref 4.22–5.81)
RDW: 14.1 % (ref 11.5–15.5)
WBC: 8.2 10*3/uL (ref 4.0–10.5)

## 2012-01-05 MED ORDER — MORPHINE SULFATE 2 MG/ML IJ SOLN
2.0000 mg | INTRAMUSCULAR | Status: DC | PRN
  Administered 2012-01-05 (×2): 4 mg via INTRAVENOUS
  Filled 2012-01-05 (×2): qty 2

## 2012-01-05 MED ORDER — OXYCODONE-ACETAMINOPHEN 5-325 MG PO TABS
1.0000 | ORAL_TABLET | ORAL | Status: DC | PRN
  Administered 2012-01-06 – 2012-01-09 (×14): 2 via ORAL
  Filled 2012-01-05 (×14): qty 2

## 2012-01-05 MED ORDER — HYDROCODONE-ACETAMINOPHEN 10-325 MG PO TABS
1.0000 | ORAL_TABLET | ORAL | Status: DC | PRN
  Administered 2012-01-05 – 2012-01-06 (×5): 1 via ORAL
  Filled 2012-01-05 (×5): qty 1

## 2012-01-05 MED ORDER — PNEUMOCOCCAL VAC POLYVALENT 25 MCG/0.5ML IJ INJ
0.5000 mL | INJECTION | INTRAMUSCULAR | Status: AC
  Administered 2012-01-05: 0.5 mL via INTRAMUSCULAR
  Filled 2012-01-05: qty 0.5

## 2012-01-05 MED ORDER — INSULIN ASPART 100 UNIT/ML ~~LOC~~ SOLN
0.0000 [IU] | Freq: Three times a day (TID) | SUBCUTANEOUS | Status: DC
  Administered 2012-01-06: 3 [IU] via SUBCUTANEOUS
  Administered 2012-01-06: 1 [IU] via SUBCUTANEOUS
  Administered 2012-01-06: 3 [IU] via SUBCUTANEOUS
  Administered 2012-01-07: 13:00:00 via SUBCUTANEOUS
  Administered 2012-01-08: 2 [IU] via SUBCUTANEOUS
  Administered 2012-01-08: 3 [IU] via SUBCUTANEOUS
  Administered 2012-01-08: 2 [IU] via SUBCUTANEOUS
  Administered 2012-01-09: 3 [IU] via SUBCUTANEOUS
  Administered 2012-01-09: 2 [IU] via SUBCUTANEOUS

## 2012-01-05 MED FILL — Insulin Aspart Inj 100 Unit/ML: SUBCUTANEOUS | Qty: 0.03 | Status: AC

## 2012-01-05 MED FILL — Bupivacaine HCl Inj 0.25%: INTRAMUSCULAR | Qty: 50 | Status: AC

## 2012-01-05 NOTE — Care Management Note (Signed)
    Page 1 of 1   01/12/2012     3:37:50 PM   CARE MANAGEMENT NOTE 01/12/2012  Patient:  [name], [name]   Account Number:  [id number]  Date Initiated:  01/05/2012  Documentation initiated by:  Onnie Boer  Subjective/Objective Assessment:   PT WAS ADMITTED FOR SURGERY     Action/Plan:   PROGRESSION OF CARE AND DISCHARGE PLANNING  PT recommending inpatient rehab   Anticipated DC Date:  01/12/2012   Anticipated DC Plan:  IP REHAB FACILITY      DC Planning Services  CM consult      PAC Choice  IP REHAB   Choice offered to / List presented to:             Status of service:  Completed, signed off Medicare Important Message given?   (If response is "NO", the following Medicare IM given date fields will be blank) Date Medicare IM given:   Date Additional Medicare IM given:    Discharge Disposition:  IP REHAB FACILITY  Per UR Regulation:  Reviewed for med. necessity/level of care/duration of stay  If discussed at Long Length of Stay Meetings, dates discussed:    Comments:  01/12/12 Patient discharged to inpatient rehab. Jacquelynn Cree RN, [facility]  01/10/2012 Carlyle Lipa, RN BSN [facility] CCM [id number]--Pt remained on vent postop until this am when he was able to extubate. Anticipate acute rehab to eval pt to determine level of d/c needs.  01/05/12 Onnie Boer, RN, BSN 1007 PT WAS ADMITTED FOR A FUSION.  PTA PT WAS AT HOME ALONE IN A SR. APARTMENT.  WILL F/U ON DC NEEDS AND PT/OT EVAL.

## 2012-01-05 NOTE — Progress Notes (Signed)
Patient [name], 66 year old African American male is enjoying a slow but steady from various physical challenges.  But patient feels encouraged by his progress.  Patient expressed appreciation for Chaplain's provision of pastoral presence, prayer, and conversation.  I will follow-up as needed.

## 2012-01-05 NOTE — Evaluation (Signed)
Physical Therapy Evaluation Patient Details Name: [name] MRN: [id number] DOB: 1946-01-02 Today's Date: 01/05/2012 Time: 8657-8469 PT Time Calculation (min): 37 min  PT Assessment / Plan / Recommendation Clinical Impression  Pt is 66 y/o male admitted for s/p posterior cervical laminectomy c2-T1.  Pt will benefit from acute PT services to improve overall mobiliy, gait pattern, transfers and overall balance.    PT Assessment  Patient needs continued PT services    Follow Up Recommendations  Skilled nursing facility    Barriers to Discharge        lEquipment Recommendations  Defer to next venue    Recommendations for Other Services     Frequency Min 5X/week    Precautions / Restrictions Precautions Precautions: Cervical Precaution Comments: Educated pt on cervical precautions. Required Braces or Orthoses: Cervical Brace Cervical Brace:  (Aspen collar) Restrictions Weight Bearing Restrictions: No   Pertinent Vitals/Pain 7-8/10 neck pain and between shoulder blade      Mobility  Bed Mobility Bed Mobility: Rolling Left;Left Sidelying to Sit;Sitting - Scoot to Edge of Bed Rolling Left: 3: Mod assist;With rail Left Sidelying to Sit: 3: Mod assist;HOB elevated;With rails Sitting - Scoot to Edge of Bed: 2: Max assist;With rail Details for Bed Mobility Assistance: Assist due to decreased bil. UE strength and cervical pain. Transfers Sit to Stand: 1: +2 Total assist;From bed;With upper extremity assist Sit to Stand: Patient Percentage: 60% Stand to Sit: 3: Mod assist;To chair/3-in-1;With armrests;With upper extremity assist Details for Transfer Assistance: Assist to initiate task. Verbal and tactile cueing for sequencing and safe technique. Ambulation/Gait Ambulation/Gait Assistance: 1: +2 Total assist Ambulation/Gait: Patient Percentage: 70% Ambulation Distance (Feet): 10 Feet Assistive device: Rolling walker Ambulation/Gait Assistance Details: +2 (A) to maintain  balance and manage lines.  Max cues for RW placement and step sequence.  (A) to maneuver RW. Gait Pattern: Step-to pattern;Decreased stance time - right;Decreased step length - left;Shuffle;Trunk flexed;Decreased trunk rotation    Exercises     PT Diagnosis: Difficulty walking;Generalized weakness;Acute pain;Abnormality of gait  PT Problem List: Decreased strength;Decreased range of motion;Decreased activity tolerance;Decreased balance;Decreased mobility;Decreased knowledge of use of DME;Pain;Impaired sensation PT Treatment Interventions: DME instruction;Gait training;Stair training;Functional mobility training;Therapeutic activities;Therapeutic exercise;Balance training;Patient/family education   PT Goals Acute Rehab PT Goals PT Goal Formulation: With patient Time For Goal Achievement: 01/19/12 Potential to Achieve Goals: Good Pt will Roll Supine to Left Side: with modified independence PT Goal: Rolling Supine to Left Side - Progress: Goal set today Pt will go Supine/Side to Sit: with modified independence PT Goal: Supine/Side to Sit - Progress: Goal set today Pt will go Sit to Supine/Side: with min assist PT Goal: Sit to Supine/Side - Progress: Goal set today Pt will go Sit to Stand: with min assist PT Goal: Sit to Stand - Progress: Goal set today Pt will go Stand to Sit: with min assist PT Goal: Stand to Sit - Progress: Goal set today Pt will Stand: with supervision;3 - 5 min PT Goal: Stand - Progress: Goal set today Pt will Ambulate: 16 - 50 feet;with min assist;with rolling walker PT Goal: Ambulate - Progress: Goal set today  Visit Information  Last PT Received On: 01/05/12 Assistance Needed: +2 PT/OT Co-Evaluation/Treatment: Yes    Subjective Data  Subjective: "I really need rehab before I go home."   Patient Stated Goal: To go to rehab before going home   Prior Functioning  Home Living Lives With: Alone Available Help at Discharge: Family (son and daughter able to  check  on pt in evenings) Type of Home: Apartment Home Access: Elevator Home Layout: One level Bathroom Shower/Tub: Forensic scientist: Standard Bathroom Accessibility: Yes How Accessible: Accessible via walker Home Adaptive Equipment: Straight cane Prior Function Level of Independence: Independent Driving: Yes Vocation: Unemployed Communication Communication: No difficulties Dominant Hand: Right    Cognition  Overall Cognitive Status: Appears within functional limits for tasks assessed/performed Arousal/Alertness: Awake/alert Orientation Level: Appears intact for tasks assessed Behavior During Session: [facility] for tasks performed    Extremity/Trunk Assessment Right Upper Extremity Assessment RUE ROM/Strength/Tone: Unable to fully assess;Deficits;Due to pain RUE ROM/Strength/Tone Deficits: Grip 3+/5. Elbow flexion 3+/5, elbow extension 3/5.  Shoulder flexion/abduction 3-/5 (unable to fully assess due to pain with shoulder ROM) RUE Sensation: Deficits RUE Sensation Deficits: Some tingling at rest but shooting pain with activity. RUE Coordination: Deficits RUE Coordination Deficits: Increased effort and time. Painful. Left Upper Extremity Assessment LUE ROM/Strength/Tone: Deficits;Unable to fully assess;Due to pain LUE ROM/Strength/Tone Deficits: 3/5 throughout. Shoulder ROM limited by pain. LUE Sensation: Deficits LUE Sensation Deficits: Numbness and tingling, worse than RUE. LUE Coordination: Deficits LUE Coordination Deficits: Increased effort and time. Painful. Right Lower Extremity Assessment RLE ROM/Strength/Tone: Unable to fully assess;Due to pain (pain due to arthritis) RLE ROM/Strength/Tone Deficits: At least 3/5 gross with functional mobility Left Lower Extremity Assessment LLE ROM/Strength/Tone: Deficits LLE ROM/Strength/Tone Deficits: 4+/5 quad; 4/5 hamstring; 3+/5 hip flexors   Balance Balance Balance Assessed: Yes Static Sitting Balance Static  Sitting - Balance Support: Feet supported Static Sitting - Level of Assistance: 5: Stand by assistance  End of Session PT - End of Session Equipment Utilized During Treatment: Gait belt;Cervical collar Activity Tolerance: Patient limited by fatigue;Patient limited by pain Patient left: in chair;with call bell/phone within reach Nurse Communication: Mobility status   Kelie Gainey 01/05/2012, 2:18 PM Jake Shark, PT DPT [phone]

## 2012-01-05 NOTE — Progress Notes (Signed)
Doing well. C/o appropriate incisional soreness.  No Numbness, tingling, weakness No Nausea /vomiting Not OOB yet   Temp:  [97.5 F (36.4 C)-99 F (37.2 C)] 99 F (37.2 C) (06/19 0600) Pulse Rate:  [58-74] 67  (06/19 0600) Resp:  [17-32] 20  (06/19 0600) BP: (121-161)/(77-87) 133/77 mmHg (06/19 0600) SpO2:  [95 %-100 %] 98 % (06/19 0600) FiO2 (%):  [98 %] 98 % (06/19 0513) Weight:  [83.915 kg (185 lb)] 83.915 kg (185 lb) (06/19 0700) Good strength and sensation Incision mod drainage last night -   Plan: Increase activity - dressing changes prn - change to po meds

## 2012-01-05 NOTE — Progress Notes (Signed)
UR COMPLETED  

## 2012-01-05 NOTE — Progress Notes (Signed)
Occupational Therapy Evaluation Patient Details Name: [name] MRN: [id number] DOB: May 08, 1946 Today's Date: 01/05/2012 Time: 8469-6295 OT Time Calculation (min): 38 min  OT Assessment / Plan / Recommendation Clinical Impression  Pt s/p posterior cervical laminectomy C2-T1 thus affecting PLOF.  Pt will benefit from acute OT services to address below problem list in prep for d/c to ST SNF before eventual return home.    OT Assessment  Patient needs continued OT Services    Follow Up Recommendations  Skilled nursing facility    Barriers to Discharge Decreased caregiver support    Equipment Recommendations  Defer to next venue    Recommendations for Other Services    Frequency  Min 2X/week    Precautions / Restrictions Precautions Precautions: Cervical Precaution Comments: Educated pt on cervical precautions. Required Braces or Orthoses: Cervical Brace Cervical Brace:  (Aspen collar) Restrictions Weight Bearing Restrictions: No   Pertinent Vitals/Pain See vitals    ADL  Eating/Feeding: Performed;Set up Where Assessed - Eating/Feeding: Chair Grooming: Performed;Wash/dry hands;Set up Where Assessed - Grooming: Unsupported sitting Upper Body Bathing: Simulated;Moderate assistance Where Assessed - Upper Body Bathing: Unsupported sitting Lower Body Bathing: Simulated;Maximal assistance Where Assessed - Lower Body Bathing: Supported sit to stand Upper Body Dressing: Performed;Moderate assistance Where Assessed - Upper Body Dressing: Unsupported sitting Lower Body Dressing: Performed;Maximal assistance Where Assessed - Lower Body Dressing: Sopported sit to stand Toilet Transfer: Simulated;+2 Total assistance Toilet Transfer: Patient Percentage: 60% Toilet Transfer Method: Sit to Barista: Other (comment) (EOB to recliner) Toileting - Clothing Manipulation and Hygiene: Performed;Minimal assistance Where Assessed - Engineer, mining  and Hygiene: Other (comment) (sitting EOB to use urinal) Equipment Used: Gait belt;Rolling walker (Aspen collar) Transfers/Ambulation Related to ADLs: Mod assist with RW for side steps to right side for balance and to maneuver RW. ADL Comments: Pt frequently drops items due to decreased  UE sensation and strength.     OT Diagnosis: Generalized weakness;Acute pain  OT Problem List: Decreased strength;Decreased range of motion;Decreased activity tolerance;Impaired balance (sitting and/or standing);Decreased coordination;Decreased knowledge of precautions;Decreased knowledge of use of DME or AE;Impaired UE functional use;Impaired sensation;Pain OT Treatment Interventions: Self-care/ADL training;DME and/or AE instruction;Therapeutic activities;Therapeutic exercise;Patient/family education;Balance training   OT Goals Acute Rehab OT Goals OT Goal Formulation: With patient Time For Goal Achievement: 01/12/12 Potential to Achieve Goals: Good ADL Goals Pt Will Perform Upper Body Bathing: with supervision;Sitting, edge of bed;Sitting, chair ADL Goal: Upper Body Bathing - Progress: Goal set today Pt Will Perform Lower Body Bathing: with min assist;Sit to stand from chair;Sit to stand from bed ADL Goal: Lower Body Bathing - Progress: Goal set today Pt Will Transfer to Toilet: with min assist;Ambulation;with DME;Comfort height toilet ADL Goal: Toilet Transfer - Progress: Goal set today Arm Goals Additional Arm Goal #1: Pt will independently perform bil UE HEP to increase strength and ROM in prep for ADLs. Arm Goal: Additional Goal #1 - Progress: Goal set today  Visit Information  Last OT Received On: 01/05/12 Assistance Needed: +2    Subjective Data  Subjective: I need rehab before I go home. Patient Stated Goal: Be able to cook again.   Prior Functioning  Home Living Lives With: Alone Available Help at Discharge: Family (son and daughter able to check on pt in evenings) Type of Home:  Apartment Home Access: Elevator Home Layout: One level Bathroom Shower/Tub: Tub/shower unit;Curtain Firefighter: Standard Bathroom Accessibility: Yes How Accessible: Accessible via walker Home Adaptive Equipment: Straight cane Prior Function Level of Independence:  Independent Driving: Yes Vocation: Unemployed Communication Communication: No difficulties Dominant Hand: Right    Cognition  Overall Cognitive Status: Appears within functional limits for tasks assessed/performed Arousal/Alertness: Awake/alert Orientation Level: Appears intact for tasks assessed Behavior During Session: [facility] for tasks performed    Extremity/Trunk Assessment Right Upper Extremity Assessment RUE ROM/Strength/Tone: Unable to fully assess;Deficits;Due to pain RUE ROM/Strength/Tone Deficits: Grip 3+/5. Elbow flexion 3+/5, elbow extension 3/5.  Shoulder flexion/abduction 3-/5 (unable to fully assess due to pain with shoulder ROM) RUE Sensation: Deficits RUE Sensation Deficits: Some tingling at rest but shooting pain with activity. RUE Coordination: Deficits RUE Coordination Deficits: Increased effort and time. Painful. Left Upper Extremity Assessment LUE ROM/Strength/Tone: Deficits;Unable to fully assess;Due to pain LUE ROM/Strength/Tone Deficits: 3/5 throughout. Shoulder ROM limited by pain. LUE Sensation: Deficits LUE Sensation Deficits: Numbness and tingling, worse than RUE. LUE Coordination: Deficits LUE Coordination Deficits: Increased effort and time. Painful.   Mobility Bed Mobility Bed Mobility: Rolling Left;Left Sidelying to Sit;Sitting - Scoot to Edge of Bed Rolling Left: 3: Mod assist;With rail Left Sidelying to Sit: 3: Mod assist;HOB elevated;With rails Sitting - Scoot to Edge of Bed: 2: Max assist;With rail Details for Bed Mobility Assistance: Assist due to decreased bil. UE strength and cervical pain. Transfers Transfers: Sit to Stand;Stand to Sit Sit to Stand: 1: +2 Total assist;From  bed;With upper extremity assist Sit to Stand: Patient Percentage: 60% Stand to Sit: 3: Mod assist;To chair/3-in-1;With armrests;With upper extremity assist Details for Transfer Assistance: Assist to initiate task. Verbal and tactile cueing for sequencing and safe technique.   Exercise    Balance    End of Session OT - End of Session Equipment Utilized During Treatment: Gait belt (Aspen collar) Activity Tolerance: Patient limited by pain;Patient limited by fatigue Patient left: in chair;with call bell/phone within reach Nurse Communication: Mobility status  01/05/2012 [name] OTR/L Pager [phone] Office [phone]  [name] 01/05/2012, 2:04 PM

## 2012-01-06 LAB — GLUCOSE, CAPILLARY: Glucose-Capillary: 193 mg/dL — ABNORMAL HIGH (ref 70–99)

## 2012-01-06 MED FILL — Insulin Aspart Inj 100 Unit/ML: SUBCUTANEOUS | Qty: 0.03 | Status: AC

## 2012-01-06 NOTE — Progress Notes (Signed)
Occupational Therapy Treatment Patient Details Name: [name] MRN: [id number] DOB: Jul 21, 1945 Today's Date: 01/06/2012 Time: 4098-1191 OT Time Calculation (min): 32 min  OT Assessment / Plan / Recommendation Comments on Treatment Session Pt with increased functional mobility.   RN made aware of significant drainage during session and present at bedside end of session.  Pt would greatly benefit from ST SNF.    Follow Up Recommendations       Barriers to Discharge       Equipment Recommendations  Defer to next venue    Recommendations for Other Services    Frequency     Plan      Precautions / Restrictions Precautions Precautions: Cervical Precaution Comments: Educated pt on cervical precautions. Required Braces or Orthoses: Cervical Brace Restrictions Weight Bearing Restrictions: No   Pertinent Vitals/Pain See vitals    ADL  Grooming: Performed;Teeth care;Set up Where Assessed - Grooming: Supported standing Lower Body Dressing: Performed;Minimal assistance Where Assessed - Lower Body Dressing: Sopported sit to stand Toilet Transfer: Simulated;+2 Total assistance Toilet Transfer: Patient Percentage: 60% Toilet Transfer Method:  (ambulating) Acupuncturist: Other (comment) (recliner) Equipment Used: Rolling walker;Gait belt (aspen collar; R knee brace for comfort) Transfers/Ambulation Related to ADLs: min assist with RW.  Pt veers to right side due to decreased LUE strength to control RW.  Assist to position LUE on RW. ADL Comments: Pt attempting to use LUE to open grooming items but then switching to right hand due to left hand fatigue.  Pt reports decreased pain in RUE and decreased numbness/tingling in bil. UEs.  LUE continues to "flop" when performing gross motor movements.    OT Diagnosis:    OT Problem List:   OT Treatment Interventions:     OT Goals ADL Goals Pt Will Perform Lower Body Dressing: with supervision;Sit to stand from chair;Sit to  stand from bed ADL Goal: Lower Body Dressing - Progress: Goal set today Pt Will Transfer to Toilet: with min assist;Ambulation;with DME;Comfort height toilet ADL Goal: Toilet Transfer - Progress: Progressing toward goals  Visit Information  Last OT Received On: 01/06/12 Assistance Needed: +2    Subjective Data      Prior Functioning       Cognition  Overall Cognitive Status: Appears within functional limits for tasks assessed/performed Arousal/Alertness: Awake/alert Orientation Level: Appears intact for tasks assessed Behavior During Session: [facility] for tasks performed    Mobility Transfers Sit to Stand: 1: +2 Total assist;From bed;With upper extremity assist Sit to Stand: Patient Percentage: 60% Stand to Sit: 2: Max assist Details for Transfer Assistance: (A) to initiate transfer with max cues for hand and LE placement.  (A) to slowly descend to recliner.  PT tends to "flop" into chair.     Exercises    Balance Balance Balance Assessed: Yes Static Sitting Balance Static Sitting - Balance Support: Feet supported Static Sitting - Level of Assistance: 5: Stand by assistance Static Standing Balance Static Standing - Balance Support: Left upper extremity supported Static Standing - Level of Assistance: 5: Stand by assistance;4: Min assist Static Standing - Comment/# of Minutes: ~5 minutes to perform ADL task.  Pt with intermittent min (A) to maintain balance due to posterior lean.  End of Session OT - End of Session Equipment Utilized During Treatment: Gait belt;Other (comment) (aspen collar) Activity Tolerance: Patient tolerated treatment well Patient left: in chair;with call bell/phone within reach;with nursing in room Nurse Communication: Mobility status;Other (comment) (drainage)  01/06/2012 [name] OTR/L Pager [phone] Office  [phone]  [name] 01/06/2012, 8:55 AM

## 2012-01-06 NOTE — Clinical Social Work Placement (Addendum)
    Clinical Social Work Department CLINICAL SOCIAL WORK PLACEMENT NOTE 01/06/2012  Patient:  [name], [name]  Account Number:  [id number] Admit date:  01/04/2012  Clinical Social Worker:  Peggyann Shoals  Date/time:  01/06/2012 10:54 AM  Clinical Social Work is seeking post-discharge placement for this patient at the following level of care:   SKILLED NURSING   (*CSW will update this form in Epic as items are completed)   01/06/2012  Patient/family provided with [facility] Department of Clinical Social Work's list of facilities offering this level of care within the geographic area requested by the patient (or if unable, by the patient's family).  01/06/2012  Patient/family informed of their freedom to choose among providers that offer the needed level of care, that participate in Medicare, Medicaid or managed care program needed by the patient, have an available bed and are willing to accept the patient.  01/06/2012  Patient/family informed of [facility]' ownership interest in [facility], as well as of the fact that they are under no obligation to receive care at this facility.  PASARR submitted to EDS on 01/06/2012 PASARR number received from EDS on 01/06/2012  FL2 transmitted to all facilities in geographic area requested by pt/family on  01/06/2012 FL2 transmitted to all facilities within larger geographic area on   Patient informed that his/her managed care company has contracts with or will negotiate with  certain facilities, including the following:     Patient/family informed of bed offers received: 01/07/2012  Patient chooses bed at [facility] Physician recommends and patient chooses bed at  [facility]  Patient to be transferred to [facility] on  01/02/2012 Patient to be transferred to facility by n/a  The following physician request were entered in Epic:   Additional Comments: Initial plan was for SNF, however it changed to CIR.  Dede Query, MSW, Theresia Majors [phone]

## 2012-01-06 NOTE — Progress Notes (Signed)
Doing well. C/o appropriate incisional soreness.  No Numbness, tingling, weakness No Nausea /vomiting Amb/ voiding well  Temp:  [98.7 F (37.1 C)-102.7 F (39.3 C)] 98.7 F (37.1 C) (06/20 0600) Pulse Rate:  [84-112] 84  (06/20 0600) Resp:  [18-20] 20  (06/20 0600) BP: (109-144)/(69-85) 113/69 mmHg (06/20 0600) SpO2:  [91 %-96 %] 92 % (06/20 0600) Good strength and sensation Incision mod drainage  Plan: Increase activity - await SNF placement

## 2012-01-06 NOTE — Progress Notes (Signed)
Physical Therapy Treatment Patient Details Name: [name] MRN: [id number] DOB: April 03, 1946 Today's Date: 01/06/2012 Time: 7829-5621 PT Time Calculation (min): 31 min  PT Assessment / Plan / Recommendation Comments on Treatment Session  Pt able to increase ambulation and performed standing task ~ 5 minutes.  Noticeable amounts of drainage.  RN aware and in room at end of session with pt in chair.    Follow Up Recommendations  Skilled nursing facility    Barriers to Discharge        Equipment Recommendations  Defer to next venue    Recommendations for Other Services    Frequency Min 5X/week   Plan Discharge plan remains appropriate;Frequency remains appropriate    Precautions / Restrictions Precautions Precautions: Cervical Precaution Comments: Educated pt on cervical precautions. Required Braces or Orthoses: Cervical Brace Restrictions Weight Bearing Restrictions: No   Pertinent Vitals/Pain C/o neck soreness and pain but does not rate    Mobility  Transfers Transfers: Sit to Stand;Stand to Sit Sit to Stand: 1: +2 Total assist;From bed;With upper extremity assist Sit to Stand: Patient Percentage: 60% Stand to Sit: 2: Max assist Details for Transfer Assistance: (A) to initiate transfer with max cues for hand and LE placement.  (A) to slowly descend to recliner.  PT tends to "flop" into chair.   Ambulation/Gait Ambulation/Gait Assistance: 4: Min assist Ambulation Distance (Feet): 20 Feet Assistive device: Rolling walker Ambulation/Gait Assistance Details: (A) to maintain balance with max cues for RW placement, step sequence and upright posture.  (A) to manage RW due veering to the right sided. Gait Pattern: Step-to pattern;Decreased stance time - right;Decreased step length - left;Shuffle;Trunk flexed;Decreased trunk rotation    Exercises     PT Diagnosis:    PT Problem List:   PT Treatment Interventions:     PT Goals Acute Rehab PT Goals PT Goal Formulation: With  patient Time For Goal Achievement: 01/19/12 Potential to Achieve Goals: Good Pt will go Sit to Stand: with min assist PT Goal: Sit to Stand - Progress: Progressing toward goal Pt will go Stand to Sit: with min assist PT Goal: Stand to Sit - Progress: Progressing toward goal Pt will Stand: with supervision;3 - 5 min PT Goal: Stand - Progress: Progressing toward goal Pt will Ambulate: 16 - 50 feet;with min assist;with rolling walker PT Goal: Ambulate - Progress: Progressing toward goal  Visit Information  Last PT Received On: 01/06/12 Assistance Needed: +2 PT/OT Co-Evaluation/Treatment: Yes    Subjective Data  Subjective: "I feel 3x better today."   Cognition  Overall Cognitive Status: Appears within functional limits for tasks assessed/performed Arousal/Alertness: Awake/alert Orientation Level: Appears intact for tasks assessed Behavior During Session: [facility] for tasks performed    Balance  Balance Balance Assessed: Yes Static Sitting Balance Static Sitting - Balance Support: Feet supported Static Sitting - Level of Assistance: 5: Stand by assistance Static Standing Balance Static Standing - Balance Support: Left upper extremity supported Static Standing - Level of Assistance: 5: Stand by assistance;4: Min assist Static Standing - Comment/# of Minutes: ~5 minutes to perform ADL task.  Pt with intermittent min (A) to maintain balance due to posterior lean.  End of Session PT - End of Session Equipment Utilized During Treatment: Gait belt;Cervical collar Activity Tolerance: Patient limited by fatigue Patient left: in chair;with call bell/phone within reach Nurse Communication: Mobility status    Vladislav Axelson 01/06/2012, 8:42 AM Jake Shark, PT DPT [phone]

## 2012-01-06 NOTE — Clinical Social Work Psychosocial (Signed)
     Clinical Social Work Department BRIEF PSYCHOSOCIAL ASSESSMENT 01/06/2012  Patient:  [name], [name]     Account Number:  [id number]     Admit date:  01/04/2012  Clinical Social Worker:  Peggyann Shoals  Date/Time:  01/06/2012 10:46 AM  Referred by:  Physician  Date Referred:  01/05/2012 Referred for  SNF Placement   Other Referral:   Interview type:  Patient Other interview type:    PSYCHOSOCIAL DATA Living Status:  ALONE Admitted from facility:   Level of care:   Primary support name:  Joella Prince Primary support relationship to patient:  CHILD, ADULT Degree of support available:   Pt's son is very supportive. Contact info [phone]. Pt has strong family support.    CURRENT CONCERNS Current Concerns  Post-Acute Placement   Other Concerns:    SOCIAL WORK ASSESSMENT / PLAN CSW met with pt to address consult. PT/OT are recommending SNF at discharge. CSW introduced herself and explained role of social work. CSW also explained the process of SNF. Pt lives alone and shared that he will need rehab prior to returning home. Pt's son, dtr-in-law, and dtr are all very supportive.    CSW will initiate SNF search for [facility] and follow up with bed offers. CSW will continue to follow.   Assessment/plan status:  Psychosocial Support/Ongoing Assessment of Needs Other assessment/ plan:   Information/referral to community resources:   SNF list    PATIENTS/FAMILYS RESPONSE TO PLAN OF CARE: Pt is alert and oriented and very pleasant. Pt is agreeable to SNF placement.

## 2012-01-07 LAB — GLUCOSE, CAPILLARY
Glucose-Capillary: 121 mg/dL — ABNORMAL HIGH (ref 70–99)
Glucose-Capillary: 152 mg/dL — ABNORMAL HIGH (ref 70–99)
Glucose-Capillary: 99 mg/dL (ref 70–99)
Glucose-Capillary: 122 mg/dL — ABNORMAL HIGH (ref 70–99)

## 2012-01-07 NOTE — Progress Notes (Signed)
Physical Therapy Treatment Patient Details Name: [name] MRN: [id number] DOB: Mar 28, 1946 Today's Date: 01/07/2012 Time: 4098-1191 PT Time Calculation (min): 27 min  PT Assessment / Plan / Recommendation Comments on Treatment Session  Patient very motivated to progress with mobilty and use less assistance from others. Patient progressing with ambulation today. No excess drainage noted this session    Follow Up Recommendations  Skilled nursing facility    Barriers to Discharge        Equipment Recommendations  Defer to next venue    Recommendations for Other Services    Frequency Min 5X/week   Plan Discharge plan remains appropriate;Frequency remains appropriate    Precautions / Restrictions Precautions Precautions: Cervical Precaution Comments: Educated pt on cervical precautions. Required Braces or Orthoses: Cervical Brace Cervical Brace: Hard collar Restrictions Weight Bearing Restrictions: No   Pertinent Vitals/Pain 5/10 neck pain    Mobility  Bed Mobility Bed Mobility: Supine to Sit;Sitting - Scoot to Edge of Bed Supine to Sit: 5: Supervision;With rails Sitting - Scoot to Edge of Bed: 5: Supervision Details for Bed Mobility Assistance: Requires increased time to complete task and cues forpositioning Transfers Sit to Stand: 1: +2 Total assist;From bed;With upper extremity assist Sit to Stand: Patient Percentage: 70% Stand to Sit: With armrests;With upper extremity assist;To chair/3-in-1;3: Mod assist Details for Transfer Assistance: A to initiate stand and ensure LE balance as patient with slight buckle. Cues for safe technique with stand to sit and to control descent onto recliner.  Ambulation/Gait Ambulation/Gait Assistance: 4: Min assist Ambulation Distance (Feet): 50 Feet Assistive device: Rolling walker Ambulation/Gait Assistance Details: A withRW positioning and for balance. Cues for posture and LE placement. Due to left UE weakness patient tends to steer  RW towards R side Gait Pattern: Step-to pattern;Narrow base of support;Decreased step length - left;Decreased step length - right    Exercises     PT Diagnosis:    PT Problem List:   PT Treatment Interventions:     PT Goals Acute Rehab PT Goals PT Goal: Supine/Side to Sit - Progress: Progressing toward goal PT Goal: Sit to Stand - Progress: Progressing toward goal PT Goal: Stand to Sit - Progress: Progressing toward goal PT Goal: Ambulate - Progress: Progressing toward goal  Visit Information  Last PT Received On: 01/07/12 Assistance Needed: +2    Subjective Data      Cognition  Overall Cognitive Status: Appears within functional limits for tasks assessed/performed Arousal/Alertness: Awake/alert Orientation Level: Appears intact for tasks assessed Behavior During Session: [facility] for tasks performed    Balance     End of Session PT - End of Session Equipment Utilized During Treatment: Gait belt;Cervical collar Activity Tolerance: Patient tolerated treatment well Patient left: in chair Nurse Communication: Mobility status    Fredrich Birks 01/07/2012, 9:52 AM 01/07/2012 Fredrich Birks PTA [phone] pager [phone] office

## 2012-01-07 NOTE — Progress Notes (Signed)
Occupational Therapy Treatment Patient Details Name: [name] MRN: [id number] DOB: [birth year] Today's Date: 01/07/2012 Time: 7829-5621 OT Time Calculation (min): 22 min  OT Assessment / Plan / Recommendation Comments on Treatment Session Pt with increased activity tolerance during functional mobility.  Plan to address HEP during next session to increase bil UE strength.    Follow Up Recommendations  Skilled nursing facility    Barriers to Discharge       Equipment Recommendations  Defer to next venue    Recommendations for Other Services    Frequency Min 2X/week   Plan Discharge plan remains appropriate    Precautions / Restrictions Precautions Precautions: Cervical Precaution Comments: Educated pt on cervical precautions. Required Braces or Orthoses: Cervical Brace Cervical Brace:  (aspen collar) Restrictions Weight Bearing Restrictions: No   Pertinent Vitals/Pain See vitals    ADL  Toilet Transfer: Simulated;+2 Total assistance Toilet Transfer: Patient Percentage: 70% Toilet Transfer Method:  (ambulating) Acupuncturist:  Nurse, children's) Equipment Used: Gait belt;Rolling walker (aspen collar) Transfers/Ambulation Related to ADLs: min guard- min assist with RW during ambulationg.  Pt with more control of left hand placement on RW today but continues to experience difficulty using left hand to steer RW therefore frequently veers to right side. Right knee brace applied for comfort during functional mobility.  ADL Comments: Pt continues to experience difficulty with LUE gross/fine motor coordination.      OT Diagnosis:    OT Problem List:   OT Treatment Interventions:     OT Goals ADL Goals Pt Will Transfer to Toilet: with min assist;Ambulation;with DME;Comfort height toilet ADL Goal: Toilet Transfer - Progress: Progressing toward goals  Visit Information  Last OT Received On: 01/07/12 Assistance Needed: +2    Subjective Data      Prior Functioning      Cognition  Overall Cognitive Status: Appears within functional limits for tasks assessed/performed Arousal/Alertness: Awake/alert Orientation Level: Appears intact for tasks assessed Behavior During Session: [facility] for tasks performed    Mobility Bed Mobility Bed Mobility: Supine to Sit;Sitting - Scoot to Edge of Bed Supine to Sit: 5: Supervision;With rails;HOB elevated Sitting - Scoot to Edge of Bed: 5: Supervision Details for Bed Mobility Assistance: Required increased time to complete tasks due to bil UE weakness. Transfers Transfers: Stand to Sit;Sit to Stand Sit to Stand: 1: +2 Total assist;With upper extremity assist;From bed Sit to Stand: Patient Percentage: 70% Stand to Sit: 3: Mod assist;To chair/3-in-1;With upper extremity assist;With armrests Details for Transfer Assistance: Assist to initiate transfer.  Pt with left hand placed on RW for sit<>stand but unable to pull up using LUE due to weakness.     Exercises    Balance    End of Session OT - End of Session Equipment Utilized During Treatment: Gait belt;Cervical collar Activity Tolerance: Patient tolerated treatment well Patient left: in chair;with call bell/phone within reach Nurse Communication: Mobility status  01/07/2012 [name] OTR/L Pager [phone] Office [phone] [name] 01/07/2012, 9:42 AM

## 2012-01-07 NOTE — Progress Notes (Signed)
Doing well. C/o appropriate incisional soreness.  No Numbness, tingling, weakness No Nausea /vomiting Amb/ voiding well  Temp:  [98.2 F (36.8 C)-100.1 F (37.8 C)] 99.2 F (37.3 C) (06/21 0514) Pulse Rate:  [70-92] 70  (06/21 0514) Resp:  [18-20] 20  (06/21 0514) BP: (104-133)/(57-77) 126/77 mmHg (06/21 0514) SpO2:  [91 %-96 %] 91 % (06/21 0514) Good strength and sensation Incision less drainage  Plan: Increase activity - await placement

## 2012-01-07 NOTE — Progress Notes (Addendum)
Clinical Social Worker provided pt SNF bed offers at bedside. Pt reports that he plans to contact pt son and asked Clinical Social Worker to follow up in regard to decision. Clinical Social Worker to continue to follow.  Addendum 2:31pm: Clinical Child psychotherapist received phone call from pt son, Leone Putman who was present in pt room at this time. Clinical Social Worker met with pt and pt son in pt room. Clinical Social Worker clarified pt son questions and updated bed offers. Pt and pt son plan to speak about bed offers and make decision. Clinical Social Worker to follow up with pt and pt son in regard to decision for SNF.   Addendum 4:30 pm: Clinical Social Worker continuing to follow. Pt unable to be new admit to SNF over weekend. Clinical Social Worker to facilitate pt discharge needs on Monday.  Jacklynn Lewis, MSW, LCSWA  Clinical Social Work [phone]

## 2012-01-08 LAB — GLUCOSE, CAPILLARY
Glucose-Capillary: 130 mg/dL — ABNORMAL HIGH (ref 70–99)
Glucose-Capillary: 142 mg/dL — ABNORMAL HIGH (ref 70–99)
Glucose-Capillary: 166 mg/dL — ABNORMAL HIGH (ref 70–99)
Glucose-Capillary: 140 mg/dL — ABNORMAL HIGH (ref 70–99)

## 2012-01-08 NOTE — Progress Notes (Signed)
Patient ID: [name], male   DOB: [birth year], 66 y.o.   MRN: [id number] Subjective: Patient reports continued neck pain. LUE a little weaker than pre-op per pt.   Objective: Vital signs in last 24 hours: Temp:  [98.5 F (36.9 C)-100.5 F (38.1 C)] 99 F (37.2 C) (06/22 0528) Pulse Rate:  [77-93] 85  (06/22 0528) Resp:  [18-20] 18  (06/22 0528) BP: (120-157)/(68-88) 157/84 mmHg (06/22 0528) SpO2:  [92 %-96 %] 94 % (06/22 0528)  Intake/Output from previous day: 06/21 0701 - 06/22 0700 In: 240 [P.O.:240] Out: 1550 [Urine:1550] Intake/Output this shift:    Neurologic: Motor: Declined: L:UE with chronic atrophy L hand  Lab Results: Lab Results  Component Value Date   WBC 8.2 01/04/2012   HGB 12.1* 01/04/2012   HCT 36.4* 01/04/2012   MCV 89.7 01/04/2012   PLT 227 01/04/2012   No results found for this basename: INR, PROTIME   BMET Lab Results  Component Value Date   NA 139 12/28/2011   K 4.3 12/28/2011   CL 103 12/28/2011   CO2 26 12/28/2011   GLUCOSE 114* 12/28/2011   BUN 14 12/28/2011   CREATININE 0.71 12/28/2011   CALCIUM 9.9 12/28/2011    Studies/Results: No results found.  Assessment/Plan: Continue therapy. SNF   LOS: 4 days    [name] S 01/08/2012, 9:27 AM

## 2012-01-08 NOTE — Progress Notes (Signed)
Physical Therapy Treatment Patient Details Name: [name] MRN: [id number] DOB: 1946-06-28 Today's Date: 01/08/2012 Time: 1010-1038 PT Time Calculation (min): 28 min  PT Assessment / Plan / Recommendation Comments on Treatment Session  Significantly different ability today, unable to ambulate more than 2 feet whereas yesterday he ambulated 50 ft. More anxious today? Difficult to tell as this is the first I have seen him. RN aware    Follow Up Recommendations  Skilled nursing facility    Barriers to Discharge        Equipment Recommendations  Defer to next venue    Recommendations for Other Services    Frequency     Plan Discharge plan remains appropriate    Precautions / Restrictions Precautions Precautions: Fall;Cervical Cervical Brace: Hard collar       Mobility  Bed Mobility Supine to Sit: 3: Mod assist;HOB elevated Sitting - Scoot to Edge of Bed: 3: Mod assist Details for Bed Mobility Assistance: needing mod facilitation and sequencing cues today for bed mobility, stabilized LUE so that he was able to push through it during scooting (poor control of LUE especially at hand and wrist)  Transfers Sit to Stand: 1: +2 Total assist;From elevated surface;From bed;From chair/3-in-1;With upper extremity assist Sit to Stand: Patient Percentage: 70% Stand to Sit: 1: +2 Total assist;To chair/3-in-1 Stand to Sit: Patient Percentage: 30% Details for Transfer Assistance: facilitation and sequencing cues to bring trunk over BOS as well as follow through to stand and assist with controlling LUE onto RW to support himself; stands with left knee hyperextension and weight shifting left to unweight right knee (audible crepitus from arthritis), anxious about falling; needing to be assisted to control descent to chair Ambulation/Gait Ambulation/Gait Assistance: 1: +2 Total assist Ambulation/Gait: Patient Percentage: 40% Ambulation Distance (Feet): 2 Feet Assistive device: Rolling  walker Ambulation/Gait Assistance Details: very limited ambulation today with two attempts, pt very scared, hyperextending his left knee ( likely weakness), difficulty advancing either leg because of pain and fear of RLE buckling, with even the slightly bit of buckling the patient gets very nervous and loses his control needing to be physically assisted to the chair  crying out "Im falling"  Gait Pattern: Trunk flexed;Left genu recurvatum;Decreased weight shift to right    Exercises General Exercises - Lower Extremity Ankle Circles/Pumps: AROM;Both;10 reps;Seated Quad Sets: AROM;Left;10 reps;Seated Long Arc Quad: AROM;Left;10 reps;Seated Shoulder Exercises Shoulder Flexion: AAROM;Left;10 reps;Supine     PT Goals Acute Rehab PT Goals PT Goal: Supine/Side to Sit - Progress: Progressing toward goal PT Goal: Sit to Stand - Progress: Progressing toward goal PT Goal: Stand to Sit - Progress: Progressing toward goal PT Goal: Stand - Progress: Progressing toward goal PT Goal: Ambulate - Progress: Not progressing  Visit Information  Last PT Received On: 01/08/12 Assistance Needed: +2 (+3 might be helpful for chair follow)    Subjective Data  Subjective: If I could just get rid of this pain I could get moving better.    Cognition  Overall Cognitive Status: Impaired Area of Impairment: Safety/judgement;Awareness of errors;Problem solving Arousal/Alertness: Awake/alert Orientation Level: Appears intact for tasks assessed Behavior During Session: Anxious Safety/Judgement: Decreased safety judgement for tasks assessed Cognition - Other Comments: slow processing, someones slurring his words a bit, difficulty sequencing movement especially with weak LUE, very anxious, almost self limiting in a way    Balance  Static Standing Balance Static Standing - Balance Support: No upper extremity supported Static Standing - Level of Assistance: 5: Stand by assistance Static Standing -  Comment/# of  Minutes: standing to use urinal, no LOB noted  End of Session PT - End of Session Equipment Utilized During Treatment: Gait belt Activity Tolerance: Patient limited by fatigue;Other (comment) (anxiety) Patient left: in chair;with call bell/phone within reach Nurse Communication: Mobility status    [facility] HELEN 01/08/2012, 1:17 PM

## 2012-01-09 ENCOUNTER — Encounter (HOSPITAL_COMMUNITY)
Admission: RE | Disposition: A | Discharge status: Rehabilitation Facility | Payer: Self-pay | Source: Ambulatory Visit | Attending: Neurosurgery

## 2012-01-09 ENCOUNTER — Inpatient Hospital Stay (HOSPITAL_COMMUNITY): Payer: Medicare Other

## 2012-01-09 ENCOUNTER — Encounter (HOSPITAL_COMMUNITY): Payer: Self-pay | Admitting: Certified Registered Nurse Anesthetist

## 2012-01-09 ENCOUNTER — Inpatient Hospital Stay (HOSPITAL_COMMUNITY): Payer: Medicare Other | Admitting: Certified Registered Nurse Anesthetist

## 2012-01-09 DIAGNOSIS — J9600 Acute respiratory failure, unspecified whether with hypoxia or hypercapnia: Secondary | ICD-10-CM

## 2012-01-09 DIAGNOSIS — Z9911 Dependence on respirator [ventilator] status: Secondary | ICD-10-CM

## 2012-01-09 DIAGNOSIS — IMO0002 Reserved for concepts with insufficient information to code with codable children: Secondary | ICD-10-CM

## 2012-01-09 HISTORY — PX: POSTERIOR CERVICAL LAMINECTOMY: SHX2248

## 2012-01-09 LAB — BLOOD GAS, ARTERIAL
Bicarbonate: 29 mEq/L — ABNORMAL HIGH (ref 20.0–24.0)
Patient temperature: 98.6
TCO2: 30.4 mmol/L (ref 0–100)
pH, Arterial: 7.433 (ref 7.350–7.450)

## 2012-01-09 LAB — COMPREHENSIVE METABOLIC PANEL
ALT: 38 U/L (ref 0–53)
Albumin: 2.6 g/dL — ABNORMAL LOW (ref 3.5–5.2)
Alkaline Phosphatase: 64 U/L (ref 39–117)
Chloride: 92 mEq/L — ABNORMAL LOW (ref 96–112)
Glucose, Bld: 226 mg/dL — ABNORMAL HIGH (ref 70–99)
Potassium: 5.1 mEq/L (ref 3.5–5.1)
Sodium: 130 mEq/L — ABNORMAL LOW (ref 135–145)
Total Protein: 6.5 g/dL (ref 6.0–8.3)

## 2012-01-09 LAB — APTT: aPTT: 33 seconds (ref 24–37)

## 2012-01-09 LAB — DIFFERENTIAL
Basophils Relative: 0 % (ref 0–1)
Lymphocytes Relative: 4 % — ABNORMAL LOW (ref 12–46)
Lymphs Abs: 0.4 10*3/uL — ABNORMAL LOW (ref 0.7–4.0)
Monocytes Relative: 2 % — ABNORMAL LOW (ref 3–12)
Neutro Abs: 10.8 10*3/uL — ABNORMAL HIGH (ref 1.7–7.7)
Neutrophils Relative %: 94 % — ABNORMAL HIGH (ref 43–77)

## 2012-01-09 LAB — CBC
Hemoglobin: 11.3 g/dL — ABNORMAL LOW (ref 13.0–17.0)
RBC: 3.75 MIL/uL — ABNORMAL LOW (ref 4.22–5.81)
WBC: 11.5 10*3/uL — ABNORMAL HIGH (ref 4.0–10.5)

## 2012-01-09 LAB — GLUCOSE, CAPILLARY: Glucose-Capillary: 93 mg/dL (ref 70–99)

## 2012-01-09 LAB — PROTIME-INR: INR: 0.97 (ref 0.00–1.49)

## 2012-01-09 LAB — LIPASE, BLOOD: Lipase: 10 U/L — ABNORMAL LOW (ref 11–59)

## 2012-01-09 LAB — CARDIAC PANEL(CRET KIN+CKTOT+MB+TROPI)
Relative Index: 1.6 (ref 0.0–2.5)
Total CK: 212 U/L (ref 7–232)
Troponin I: <0.3 ng/mL (ref <0.30)

## 2012-01-09 LAB — PRO B NATRIURETIC PEPTIDE: Pro B Natriuretic peptide (BNP): 10.8 pg/mL (ref 0–125)

## 2012-01-09 SURGERY — POSTERIOR CERVICAL LAMINECTOMY
Anesthesia: General | Site: Neck | Wound class: Clean

## 2012-01-09 MED ORDER — NEOSTIGMINE METHYLSULFATE 1 MG/ML IJ SOLN
INTRAMUSCULAR | Status: DC | PRN
  Administered 2012-01-09: 4 mg via INTRAVENOUS

## 2012-01-09 MED ORDER — DEXTROSE 50 % IV SOLN: 25.0000 mL | INTRAVENOUS | Status: DC | PRN

## 2012-01-09 MED ORDER — CEFAZOLIN SODIUM 1-5 GM-% IV SOLN
INTRAVENOUS | Status: AC
  Filled 2012-01-09: qty 50

## 2012-01-09 MED ORDER — DEXTROSE-NACL 5-0.45 % IV SOLN: INTRAVENOUS | Status: DC

## 2012-01-09 MED ORDER — MIDAZOLAM HCL 5 MG/5ML IJ SOLN
INTRAMUSCULAR | Status: DC | PRN
  Administered 2012-01-09: 1 mg via INTRAVENOUS

## 2012-01-09 MED ORDER — BACITRACIN 50000 UNITS IM SOLR
INTRAMUSCULAR | Status: AC
  Filled 2012-01-09: qty 1

## 2012-01-09 MED ORDER — GLYCOPYRROLATE 0.2 MG/ML IJ SOLN
INTRAMUSCULAR | Status: DC | PRN
  Administered 2012-01-09: .6 mg via INTRAVENOUS

## 2012-01-09 MED ORDER — SODIUM CHLORIDE 0.9 % IV SOLN: 250.0000 mL | INTRAVENOUS | Status: DC | PRN

## 2012-01-09 MED ORDER — CHLORHEXIDINE GLUCONATE 0.12 % MT SOLN
15.0000 mL | Freq: Two times a day (BID) | OROMUCOSAL | Status: DC
  Administered 2012-01-09 – 2012-01-11 (×4): 15 mL via OROMUCOSAL
  Filled 2012-01-09 (×7): qty 15

## 2012-01-09 MED ORDER — MIDAZOLAM HCL 2 MG/2ML IJ SOLN
2.0000 mg | INTRAMUSCULAR | Status: DC | PRN
  Administered 2012-01-09: 2 mg via INTRAVENOUS
  Filled 2012-01-09: qty 2

## 2012-01-09 MED ORDER — CEFAZOLIN SODIUM 1-5 GM-% IV SOLN
INTRAVENOUS | Status: DC | PRN
  Administered 2012-01-09: 1 g via INTRAVENOUS

## 2012-01-09 MED ORDER — PROMETHAZINE HCL 25 MG/ML IJ SOLN
6.2500 mg | INTRAMUSCULAR | Status: DC | PRN
  Filled 2012-01-09: qty 1

## 2012-01-09 MED ORDER — HYDROMORPHONE HCL PF 1 MG/ML IJ SOLN: 0.2500 mg | INTRAMUSCULAR | Status: DC | PRN

## 2012-01-09 MED ORDER — FENTANYL CITRATE 0.05 MG/ML IJ SOLN
INTRAMUSCULAR | Status: DC | PRN
  Administered 2012-01-09 (×2): 50 ug via INTRAVENOUS

## 2012-01-09 MED ORDER — SODIUM CHLORIDE 0.9 % IV SOLN
INTRAVENOUS | Status: DC
  Filled 2012-01-09: qty 1

## 2012-01-09 MED ORDER — KETOROLAC TROMETHAMINE 30 MG/ML IJ SOLN: 15.0000 mg | Freq: Once | INTRAMUSCULAR | Status: DC | PRN

## 2012-01-09 MED ORDER — SODIUM CHLORIDE 0.9 % IV SOLN: INTRAVENOUS | Status: DC

## 2012-01-09 MED ORDER — MEPERIDINE HCL 25 MG/ML IJ SOLN: 6.2500 mg | INTRAMUSCULAR | Status: DC | PRN

## 2012-01-09 MED ORDER — FAMOTIDINE IN NACL 20-0.9 MG/50ML-% IV SOLN
20.0000 mg | Freq: Two times a day (BID) | INTRAVENOUS | Status: DC
  Filled 2012-01-09: qty 50

## 2012-01-09 MED ORDER — PANTOPRAZOLE SODIUM 40 MG IV SOLR
40.0000 mg | INTRAVENOUS | Status: DC
  Administered 2012-01-10: 40 mg via INTRAVENOUS
  Filled 2012-01-09 (×2): qty 40

## 2012-01-09 MED ORDER — ROCURONIUM BROMIDE 100 MG/10ML IV SOLN
INTRAVENOUS | Status: DC | PRN
  Administered 2012-01-09: 40 mg via INTRAVENOUS

## 2012-01-09 MED ORDER — SODIUM CHLORIDE 0.9 % IV SOLN
INTRAVENOUS | Status: AC
  Filled 2012-01-09: qty 500

## 2012-01-09 MED ORDER — DEXAMETHASONE SODIUM PHOSPHATE 4 MG/ML IJ SOLN
4.0000 mg | Freq: Four times a day (QID) | INTRAMUSCULAR | Status: DC
  Administered 2012-01-09 – 2012-01-11 (×6): 4 mg via INTRAVENOUS
  Filled 2012-01-09 (×10): qty 1

## 2012-01-09 MED ORDER — BIOTENE DRY MOUTH MT LIQD
15.0000 mL | Freq: Four times a day (QID) | OROMUCOSAL | Status: DC
  Administered 2012-01-10 – 2012-01-12 (×10): 15 mL via OROMUCOSAL

## 2012-01-09 MED ORDER — 0.9 % SODIUM CHLORIDE (POUR BTL) OPTIME
TOPICAL | Status: DC | PRN
  Administered 2012-01-09: 1000 mL

## 2012-01-09 MED ORDER — LACTATED RINGERS IV SOLN
INTRAVENOUS | Status: DC | PRN
  Administered 2012-01-09 (×2): via INTRAVENOUS

## 2012-01-09 MED ORDER — LACTATED RINGERS IV SOLN: INTRAVENOUS | Status: DC

## 2012-01-09 MED ORDER — SODIUM CHLORIDE 0.9 % IV SOLN
INTRAVENOUS | Status: DC
  Administered 2012-01-09: 75 mL/h via INTRAVENOUS

## 2012-01-09 MED ORDER — DEXAMETHASONE SODIUM PHOSPHATE 4 MG/ML IJ SOLN: 4.0000 mg | Freq: Four times a day (QID) | INTRAMUSCULAR | Status: DC

## 2012-01-09 MED ORDER — PROPOFOL 10 MG/ML IV EMUL
INTRAVENOUS | Status: DC | PRN
  Administered 2012-01-09: 50 mg via INTRAVENOUS
  Administered 2012-01-09: 150 mg via INTRAVENOUS

## 2012-01-09 MED ORDER — HEMOSTATIC AGENTS (NO CHARGE) OPTIME
TOPICAL | Status: DC | PRN
  Administered 2012-01-09: 1 via TOPICAL

## 2012-01-09 MED ORDER — SODIUM CHLORIDE 0.9 % IR SOLN
Status: DC | PRN
  Administered 2012-01-09: 18:00:00

## 2012-01-09 MED ORDER — LIDOCAINE HCL (CARDIAC) 20 MG/ML IV SOLN
INTRAVENOUS | Status: DC | PRN
  Administered 2012-01-09: 50 mg via INTRAVENOUS
  Administered 2012-01-09: 100 mg via INTRAVENOUS

## 2012-01-09 MED ORDER — INSULIN REGULAR BOLUS VIA INFUSION
0.0000 [IU] | Freq: Three times a day (TID) | INTRAVENOUS | Status: DC
  Filled 2012-01-09: qty 10

## 2012-01-09 MED ORDER — THROMBIN 5000 UNITS EX SOLR
CUTANEOUS | Status: DC | PRN
  Administered 2012-01-09 (×2): 5000 [IU] via TOPICAL

## 2012-01-09 MED ORDER — FENTANYL CITRATE 0.05 MG/ML IJ SOLN
50.0000 ug | INTRAMUSCULAR | Status: DC | PRN
  Administered 2012-01-09 – 2012-01-10 (×3): 100 ug via INTRAVENOUS
  Administered 2012-01-10: 50 ug via INTRAVENOUS
  Administered 2012-01-10: 100 ug via INTRAVENOUS
  Filled 2012-01-09 (×5): qty 2

## 2012-01-09 MED ORDER — ONDANSETRON HCL 4 MG/2ML IJ SOLN
INTRAMUSCULAR | Status: DC | PRN
  Administered 2012-01-09: 4 mg via INTRAVENOUS

## 2012-01-09 MED ORDER — EPHEDRINE SULFATE 50 MG/ML IJ SOLN
INTRAMUSCULAR | Status: DC | PRN
  Administered 2012-01-09 (×3): 5 mg via INTRAVENOUS

## 2012-01-09 MED ORDER — DEXAMETHASONE SODIUM PHOSPHATE 10 MG/ML IJ SOLN
INTRAMUSCULAR | Status: DC | PRN
  Administered 2012-01-09: 6 mg via INTRAVENOUS

## 2012-01-09 SURGICAL SUPPLY — 66 items
ADH SKN CLS APL DERMABOND .7 (GAUZE/BANDAGES/DRESSINGS)
APL SKNCLS STERI-STRIP NONHPOA (GAUZE/BANDAGES/DRESSINGS) ×1
BAG DECANTER FOR FLEXI CONT (MISCELLANEOUS) ×2 IMPLANT
BENZOIN TINCTURE PRP APPL 2/3 (GAUZE/BANDAGES/DRESSINGS) ×1 IMPLANT
BLADE SURG 11 STRL SS (BLADE) ×1 IMPLANT
BLADE SURG ROTATE 9660 (MISCELLANEOUS) IMPLANT
BLADE ULTRA TIP 2M (BLADE) IMPLANT
BRUSH SCRUB EZ PLAIN DRY (MISCELLANEOUS) ×3 IMPLANT
BUR MATCHSTICK NEURO 3.0 LAGG (BURR) ×1 IMPLANT
BUR NEURO DIAMOND 3X3.8 (BURR) IMPLANT
BURR NEURO DIAMOND 3X3.8 (BURR)
CANISTER SUCTION 2500CC (MISCELLANEOUS) ×2 IMPLANT
CLOTH BEACON ORANGE TIMEOUT ST (SAFETY) ×2 IMPLANT
CONT SPEC 4OZ CLIKSEAL STRL BL (MISCELLANEOUS) ×3 IMPLANT
COVER MAYO STAND STRL (DRAPES) IMPLANT
DERMABOND ADVANCED (GAUZE/BANDAGES/DRESSINGS)
DERMABOND ADVANCED .7 DNX12 (GAUZE/BANDAGES/DRESSINGS) ×1 IMPLANT
DRAIN JACKSON PRATT 10MM FLAT (MISCELLANEOUS) ×1 IMPLANT
DRAPE LAPAROTOMY 100X72 PEDS (DRAPES) ×2 IMPLANT
DRAPE MICROSCOPE LEICA (MISCELLANEOUS) ×1 IMPLANT
DRAPE POUCH INSTRU U-SHP 10X18 (DRAPES) ×2 IMPLANT
DRSG EMULSION OIL 3X3 NADH (GAUZE/BANDAGES/DRESSINGS) IMPLANT
ELECT REM PT RETURN 9FT ADLT (ELECTROSURGICAL) ×2
ELECTRODE REM PT RTRN 9FT ADLT (ELECTROSURGICAL) ×1 IMPLANT
EVACUATOR SILICONE 100CC (DRAIN) ×1 IMPLANT
GAUZE SPONGE 4X4 16PLY XRAY LF (GAUZE/BANDAGES/DRESSINGS) IMPLANT
GLOVE BIO SURGEON STRL SZ 6.5 (GLOVE) ×4 IMPLANT
GLOVE BIOGEL PI IND STRL 6.5 (GLOVE) IMPLANT
GLOVE BIOGEL PI IND STRL 8 (GLOVE) ×1 IMPLANT
GLOVE BIOGEL PI INDICATOR 6.5 (GLOVE) ×5
GLOVE BIOGEL PI INDICATOR 8 (GLOVE) ×1
GLOVE ECLIPSE 7.5 STRL STRAW (GLOVE) ×2 IMPLANT
GLOVE EXAM NITRILE LRG STRL (GLOVE) IMPLANT
GLOVE EXAM NITRILE MD LF STRL (GLOVE) ×2 IMPLANT
GLOVE EXAM NITRILE XL STR (GLOVE) IMPLANT
GLOVE EXAM NITRILE XS STR PU (GLOVE) IMPLANT
GOWN BRE IMP SLV AUR LG STRL (GOWN DISPOSABLE) ×3 IMPLANT
GOWN BRE IMP SLV AUR XL STRL (GOWN DISPOSABLE) ×1 IMPLANT
GOWN STRL REIN 2XL LVL4 (GOWN DISPOSABLE) IMPLANT
HEMOSTAT SURGICEL 2X14 (HEMOSTASIS) IMPLANT
KIT BASIN OR (CUSTOM PROCEDURE TRAY) ×2 IMPLANT
KIT ROOM TURNOVER OR (KITS) ×2 IMPLANT
NDL SPNL 22GX3.5 QUINCKE BK (NEEDLE) ×1 IMPLANT
NEEDLE SPNL 22GX3.5 QUINCKE BK (NEEDLE) IMPLANT
NS IRRIG 1000ML POUR BTL (IV SOLUTION) ×2 IMPLANT
PACK LAMINECTOMY NEURO (CUSTOM PROCEDURE TRAY) ×2 IMPLANT
PAD ARMBOARD 7.5X6 YLW CONV (MISCELLANEOUS) ×5 IMPLANT
PATTIES SURGICAL .25X.25 (GAUZE/BANDAGES/DRESSINGS) IMPLANT
RUBBERBAND STERILE (MISCELLANEOUS) ×2 IMPLANT
SPONGE GAUZE 4X4 12PLY (GAUZE/BANDAGES/DRESSINGS) ×1 IMPLANT
SPONGE LAP 4X18 X RAY DECT (DISPOSABLE) IMPLANT
SPONGE SURGIFOAM ABS GEL SZ50 (HEMOSTASIS) ×2 IMPLANT
STAPLER SKIN PROX WIDE 3.9 (STAPLE) ×2 IMPLANT
STRIP CLOSURE SKIN 1/4X4 (GAUZE/BANDAGES/DRESSINGS) ×1 IMPLANT
SUT ETHILON 3 0 FSL (SUTURE) ×1 IMPLANT
SUT VIC AB 0 CT1 18XCR BRD8 (SUTURE) ×1 IMPLANT
SUT VIC AB 0 CT1 8-18 (SUTURE) ×4
SUT VIC AB 2-0 CP2 18 (SUTURE) ×3 IMPLANT
SUT VIC AB 3-0 SH 8-18 (SUTURE) ×2 IMPLANT
SYR 20ML ECCENTRIC (SYRINGE) ×2 IMPLANT
TAPE CLOTH SURG 4X10 WHT LF (GAUZE/BANDAGES/DRESSINGS) ×1 IMPLANT
TOWEL OR 17X24 6PK STRL BLUE (TOWEL DISPOSABLE) ×2 IMPLANT
TOWEL OR 17X26 10 PK STRL BLUE (TOWEL DISPOSABLE) ×2 IMPLANT
TRAY FOLEY CATH 14FRSI W/METER (CATHETERS) ×1 IMPLANT
UNDERPAD 30X30 INCONTINENT (UNDERPADS AND DIAPERS) IMPLANT
WATER STERILE IRR 1000ML POUR (IV SOLUTION) ×2 IMPLANT

## 2012-01-09 NOTE — Anesthesia Postprocedure Evaluation (Signed)
  Anesthesia Post-op Note  Patient: [name]  Procedure(s) Performed: Procedure(s) (LRB): POSTERIOR CERVICAL LAMINECTOMY (N/A)  Patient Location: PACU and NICU  Anesthesia Type: General  Level of Consciousness: sedated  Airway and Oxygen Therapy: Patient remains intubated per anesthesia plan  Post-op Pain: mild  Post-op Assessment: Post-op Vital signs reviewed and Patient's Cardiovascular Status Stable  Post-op Vital Signs: stable  Complications: No apparent anesthesia complications

## 2012-01-09 NOTE — Op Note (Signed)
01/04/2012 - 01/09/2012  7:04 PM  PATIENT:  [name]  66 y.o. male  PRE-OPERATIVE DIAGNOSIS:  Post cervical laminectomy wound fluid collection: Hematoma versus CSF collection versus? with spinal cord compression  POST-OPERATIVE DIAGNOSIS:  Post cervical laminectomy wound and epidural hematoma with spinal cord compression  PROCEDURE:  Procedure(s): POSTERIOR CERVICAL LAMINECTOMY: Exploration of posterior cervical wound and evacuation of wound and epidural hematoma  SURGEON:  Surgeon(s): Hewitt Shorts, MD  ANESTHESIA:   general  EBL:  minimal  COUNT:  Correct per nursing staff  DRAINS: Posterior cervical wound and epidural 10 mm wide flat Jackson-Pratt  SPECIMEN: Hematoma  DICTATION: Patient was brought to the operating room, placed under general endotracheal anesthesia. The 3 pin Mayfield head holder was applied, and the patient was turned to a prone position. Staples along the posterior cervical incision were removed, and then the skin was prepped with Betadine soap and solution (occipital, posterior cervical, upper back). Operative field was draped in a sterile fashion. We opened the midportion of the previous incision, and immediately encountered hematoma. This is aspirated away, and then the fascial sutures were incised, and a self-retaining retractor placed. A large-volume of hematoma drained under moderate pressure. We then exposed down to the dura. There was a portion of solid hematoma just inferior to the C2 lamina, and a larger portion over the lower exposed epidural space. Both these areas of solid hematoma were gently removed, and portions were sent to pathology as specimen. The wound and epidural space were irrigated extensively, first with saline solution, and then with bacitracin solution. Portions of the solid hematoma were adherent to the dural surface, and therefore we removed the significant mass of hematoma, but left bits stuck to the dura. In the end good  decompression of the thecal sac and epidural space was achieved. We then placed a 10 mm wide flat Jackson-Pratt drain in the epidural space and wound, it was brought out through a separate stab incision. It was sutured to the skin with a 3-0 nylon suture. The wound itself was closed in multiple layers. Paraspinal muscles were approximate interrupted undyed 0 Vicryl sutures, deep fascia was closed with interrupted undyed 0 Vicryl sutures. Scarpa's fascia closed with interrupted inverted 2-0 undyed Vicryl sutures. Subcutaneous and subcuticular closed interrupted inverted 2-0 undyed Vicryl sutures. Skin edges of the portion of the incision that had been reopened were closed with surgical staples. The portion of the incision that was not reopened, head benzoin and then Steri-Strips applied. The wound as well as the drain were dressed with sterile gauze and 4 inch Hypafix. Following surgery the patient was turned back to a supine position, the 3 pin Mayfield head holder was removed, and the patient is to be reversed and the anesthetic, extubated, and transferred to the intensive care unit for further care.  PLAN OF CARE: Admit to inpatient   PATIENT DISPOSITION:  ICU - extubated and stable.   Delay start of Pharmacological VTE agent (>24hrs) due to surgical blood loss or risk of bleeding:  yes

## 2012-01-09 NOTE — Progress Notes (Signed)
Permit signed for exploration of posterior wound. Plans to do this evening.

## 2012-01-09 NOTE — Progress Notes (Signed)
Patient ID: [name], male   DOB: [birth year], 66 y.o.   MRN: [id number] I am still concerned about his feeling that he is neurologically worse postoperatively than he was preoperatively. He feels like his left hand is worse, and he had a lot of trouble ambulating with therapy yesterday. States he was ambulating better before the surgery and using a cane. I really have no baseline here, as I never met him before the surgery and this is the first I've seen of him after the surgery, but I feel like an MRI of the cervical spine is appropriate. Continue therapy for now. A finding to be quite weak in his left hand, he does have chronic atrophy there just initially some of the weakness is long-standing.

## 2012-01-09 NOTE — Progress Notes (Signed)
Postoperative Visit Subjective: Patient emerged from anesthesia slowly, initially not following any commands or showing any movement of extremities. However he has gradually become more alert, following commands more consistently, and recovering movement of all 4 extremities. He was transferred to the intensive care unit intubated, placed on a ventilator, with the intention of allowing the anesthetic and muscle relaxants to continue to dissipate. Once he hasn't adequate tidal volumes, he will be able to be extubated.  Objective: Vital signs in last 24 hours: Filed Vitals:   01/08/12 2130 01/09/12 0200 01/09/12 0604 01/09/12 1000  BP: 119/80 104/70 108/67 122/83  Pulse: 92 84 85 81  Temp: 100.1 F (37.8 C) 98.7 F (37.1 C) 98.3 F (36.8 C) 98.8 F (37.1 C)  TempSrc: Oral   Oral  Resp: 18 16 16 18   Height:      Weight:      SpO2: 93% 95% 95% 95%    Intake/Output from previous day: 06/22 0701 - 06/23 0700 In: -  Out: 470 [Urine:470] Intake/Output this shift: Total I/O In: 300 [I.V.:300] Out: 50 [Urine:50]  Physical Exam:  Patient opening eyes spontaneously and to voice. Following commands. Moving all 4 extremities weakly, right greater than left. He is able to flex the biceps bilaterally and grip with the right hand. He is able to flex the hips, so as to raise the knees from the bed, and dorsiflex and plantarflex bilaterally. However he remains quadriparetic.  Studies/Results: Mr Cervical Spine Wo Contrast  01/09/2012  *RADIOLOGY REPORT*  Clinical Data: Postop cervical decompression and posterior fusion. Increasing weakness.  MRI CERVICAL SPINE WITHOUT CONTRAST  Technique:  Multiplanar and multiecho pulse sequences of the cervical spine, to include the craniocervical junction and cervicothoracic junction, were obtained according to standard protocol without intravenous contrast.  Comparison: MRI 11/18/2011  Findings: Preexisting ACDF with plate at A5-4.  There is diffuse cervical  spondylosis.  There has been recent total laminectomy of C3 through C7.  Posterior screw and rod fusion extends from C2-C7 bilaterally.  There is a large fluid collection in the surgical bed extending from C1-C7 and extending down to the dura.     There is marked compression of the cord due to the   fluid collection. There is cord compression extending from C3 through C6.  There is some blood within the fluid but most of  the fluid is   simple fluid indicative of CSF leak.  There was hyperintensity in the cord at C4-5 on the preop study, this remains present.  Cord signal is difficult to evaluate as the cord  is markedly compressed and very thin.  There may be some edema in the cord at the C2 level.  There is multilevel spondylosis throughout the cervical spine which is advanced.  This is present from C2 through T1.  IMPRESSION: Postop laminectomy C3-C7.  Posterior hardware fusion C2-C7.  Large fluid collection is present from C1-C7  compatible with CSF leak containing a small amount of blood.  This is compressing the cord from C3-C7, with severe spinal stenosis.  There is some probable cord edema at the C2 level.  I discussed the findings by telephone with Dr. Yetta Barre on 01/09/2012 at 1430 hours.  Original Report Authenticated By: Camelia Phenes, M.D.    Assessment/Plan: Dr. Jacklynn Bue, the anesthesiologist for his surgery, noted that prior to surgery he could not inspire sufficiently to cough. I suspect that he had some mild respiratory insufficiency due quadriparesis and cervical spinal cord dysfunction prior to surgery, and  that with the anesthetics and muscle relaxants, his respiratory effort is insufficient at this time to extubate and have him successfully breathing on his own.  We therefore elected to transfer the patient to the intensive care unit intubated and he was placed in a ventilator. I've consulted the critical care medicine service to assist is with his medical and ventilator care. I spoke with  Dr. Marchelle Gearing who will assess the patient and assist [place] with his management.  I spoke with the patient's family including his son, daughter, daughter-in-law, and, and other family members. I explained to them the intraoperative findings and his postoperative condition, and our plans for treatment and care including continuing him intubated and supporting him with mechanical ventilation. There questions regarding these matters were answered for them.  Hewitt Shorts, MD 01/09/2012, 8:00 PM

## 2012-01-09 NOTE — Progress Notes (Signed)
NPO, Dr Newell Coral in to see.

## 2012-01-09 NOTE — Anesthesia Preprocedure Evaluation (Addendum)
Anesthesia Evaluation  Patient identified by MRN, date of birth, ID band Patient awake    Reviewed: Allergy & Precautions, H&P , NPO status , Patient's Chart, lab work & pertinent test results, reviewed documented beta blocker date and time   History of Anesthesia Complications Negative for: history of anesthetic complications  Airway Mallampati: II TM Distance: >3 FB Neck ROM: Limited    Dental  (+) Edentulous Upper, Partial Lower and Dental Advisory Given   Pulmonary neg pulmonary ROS,  breath sounds clear to auscultation        Cardiovascular hypertension, Rhythm:Regular Rate:Normal     Neuro/Psych Progressive extremity weakness post-op C2-T1 posterior cervical fusion  Neuromuscular disease    GI/Hepatic   Endo/Other  Diabetes mellitus-  Renal/GU      Musculoskeletal   Abdominal   Peds  Hematology   Anesthesia Other Findings   Reproductive/Obstetrics                         Anesthesia Physical Anesthesia Plan  ASA: II and Emergent  Anesthesia Plan: General   Post-op Pain Management:    Induction: Intravenous  Airway Management Planned: Oral ETT  Additional Equipment:   Intra-op Plan:   Post-operative Plan: Extubation in OR  Informed Consent: I have reviewed the patients History and Physical, chart, labs and discussed the procedure including the risks, benefits and alternatives for the proposed anesthesia with the patient or authorized representative who has indicated his/her understanding and acceptance.     Plan Discussed with: CRNA and Surgeon  Anesthesia Plan Comments:         Anesthesia Quick Evaluation

## 2012-01-09 NOTE — Consult Note (Signed)
Name: [name] MRN: [id number] DOB: 1945-12-16    LOS: 5  PULMONARY / CRITICAL CARE MEDICINE CONSULT NOTE  HPI:  66 year old male patient of Dr. Phoebe Perch with PMH relevant for HTN, DM and dyslipidemia. Admitted on 6/18 for elective posterior C2-T1 laminectomy and fusion for cervical stenosis with myelopathy. Over the last few hours the patient developed progressive weakness and this prompted to order an MRI of the neck. The study showed a large fluid collection in the surgical plane compressing the spinal cord. He was taken emergently to the OR for surgical drainage and decompression. The patient underwent surgery without apparent complications and was transferred to the neurosurgical ICU intubated. At the time of my exam the patient is intubated, awake, alert, able to follow commands. Critical care consult called for Mechanical ventilation management.   Past Medical History  Diagnosis Date  . Neuromuscular disorder   . Hypertension   . High cholesterol   . Type II diabetes mellitus   . Arthritis     "right knee; left hip"   Past Surgical History  Procedure Date  . Anterior cervical decomp/discectomy fusion 2008  . Posterior fusion cervical spine 01/04/12  . Joint replacement     Left Hip Replacement  . Total hip arthroplasty 1999    left  . Excisional hemorrhoidectomy 1970's  . Inguinal hernia repair ~ 1975    left  . Posterior cervical laminectomy 01/04/2012    Procedure: POSTERIOR CERVICAL LAMINECTOMY;  Surgeon: Clydene Fake, MD;  Location: [facility];  Service: Neurosurgery;  Laterality: N/A;  Cervical Two to Thoracic One2  Decompressive Laminectomy, Fusion Cervical Two to Thoracic One with Instrumentation   Prior to Admission medications   Medication Sig Start Date End Date Taking? Authorizing Provider  diclofenac (VOLTAREN) 75 MG EC tablet Take 75 mg by mouth 2 (two) times daily.   Yes Historical Provider, MD  glipiZIDE (GLUCOTROL) 5 MG tablet Take 5 mg by mouth 2 (two)  times daily before a meal.   Yes Historical Provider, MD  lisinopril (PRINIVIL,ZESTRIL) 10 MG tablet Take 10 mg by mouth daily.   Yes Historical Provider, MD  metFORMIN (GLUCOPHAGE) 1000 MG tablet Take 1,000 mg by mouth 2 (two) times daily with a meal.   Yes Historical Provider, MD  pravastatin (PRAVACHOL) 40 MG tablet Take 40 mg by mouth daily.   Yes Historical Provider, MD   Allergies No Known Allergies  Family History Family History  Problem Relation Age of Onset  . Anesthesia problems Neg Hx    Social History  reports that he quit smoking about 23 years ago. His smoking use included Cigarettes. He has a 35 pack-year smoking history. He has never used smokeless tobacco. He reports that he drinks alcohol. He reports that he does not use illicit drugs.  Review Of Systems:  Unable to provide.   Vital Signs: Temp:  [98.3 F (36.8 C)-100.2 F (37.9 C)] 100.2 F (37.9 C) (06/23 2000) Pulse Rate:  [81-103] 91  (06/23 2100) Resp:  [15-18] 15  (06/23 2100) BP: (104-126)/(67-93) 124/82 mmHg (06/23 2123) SpO2:  [95 %-100 %] 100 % (06/23 2100) FiO2 (%):  [60 %-100 %] 60 % (06/23 2147) Weight:  [162 lb 0.6 oz (73.5 kg)] 162 lb 0.6 oz (73.5 kg) (06/23 2000)  Physical Examination: General:  Intubated, mechanically ventilated, no acute distress Neuro:  Awake, following commands, synchronous. Left hemiparesis. Breathing spontaneously. Good cough reflex HEENT:  PERRLA, pink conjunctivae, moist membranes Neck:  Rigid collar  in place  Cardiovascular:  RRR, no M/R/G Lungs:  Coarse breath sounds bilaterally. No wheezing or crackles. Abdomen:  Soft, nontender, nondistended, bowel sounds present Musculoskeletal:  No edema, cyanosis or clubbing. Skin:  No rash    ASSESSMENT AND PLAN: 66 year old male patient of Dr. Phoebe Perch with PMH relevant for HTN, DM and dyslipidemia. Admitted on 6/18 for elective posterior C2-T1 laminectomy and fusion for cervical stenosis with myelopathy. Had progressive  weakness and an MRI of the neck showed a fluid collection at the surgical site compressing the spinar cord. He was taken emergently to surgical evacuation and decompression. Now back to the ICU intubated on mechanical ventilation. Awake, alert, breathing spontaneously. Following commands. Left hemiparesis on exam. Consult called for ventilatory management.   PULMONARY  Lab 01/09/12 2120  PHART 7.433  PCO2ART 44.2  PO2ART 193.0*  HCO3 29.0*  O2SAT 99.2   CXR:  No acute infiltrates ETT:  Adequate position  A:  1) Acute respiratory failure secondary to cervical spinal cord compression by epidural hematoma  P: 1) Mechanical ventilation   Ventilator Settings: Vent Mode:  [-] PRVC FiO2 (%):  [60 %-100 %] 60 % Set Rate:  [15 bmp] 15 bmp Vt Set:  [600 mL] 600 mL PEEP:  [5 cmH20] 5 cmH20 Plateau Pressure:  [16 cmH20] 16 cmH20 2) VAP prevention order set 3) SBT in am  2) Sedation and pain control - Fentanyl PRN - Versed PRN  3) GI prophylaxis - IV protonix  4) DVT prophylaxis - Intermittent compression.  5) DM2 - ICU hyperglycemia protocol    The patient is critically ill with multiple organ systems failure and requires high complexity decision making for assessment and support, frequent evaluation and titration of therapies, application of advanced monitoring technologies and extensive interpretation of multiple databases.   Critical Care Time devoted to patient care services described in this note is: 21 Minutes  Overton Mam, M.D. Pulmonary and Critical Care Medicine [facility] Pager: [phone]  01/09/2012, 10:37 PM

## 2012-01-09 NOTE — Anesthesia Procedure Notes (Signed)
Procedure Name: Intubation Date/Time: 01/09/2012 5:50 PM Performed by: Julianne Rice Z Pre-anesthesia Checklist: Patient identified, Timeout performed, Emergency Drugs available, Suction available and Patient being monitored Patient Re-evaluated:Patient Re-evaluated prior to inductionOxygen Delivery Method: Circle system utilized Preoxygenation: Pre-oxygenation with 100% oxygen Intubation Type: IV induction and Cricoid Pressure applied Ventilation: Mask ventilation without difficulty Grade View: Grade I Tube type: Oral Tube size: 7.5 mm Number of attempts: 1 Airway Equipment and Method: Stylet and Video-laryngoscopy Placement Confirmation: ETT inserted through vocal cords under direct vision,  breath sounds checked- equal and bilateral and positive ETCO2 Secured at: 23 cm Tube secured with: Tape Dental Injury: Teeth and Oropharynx as per pre-operative assessment  Difficulty Due To: Difficulty was unanticipated Comments: Easily intubated with Glidescope as planned.

## 2012-01-09 NOTE — Preoperative (Signed)
Beta Blockers   Reason not to administer Beta Blockers:Not Applicable 

## 2012-01-09 NOTE — Transfer of Care (Signed)
Immediate Anesthesia Transfer of Care Note  Patient: [name]  Procedure(s) Performed: Procedure(s) (LRB): POSTERIOR CERVICAL LAMINECTOMY (N/A)  Patient Location: NICU  Anesthesia Type: General  Level of Consciousness: awake  Airway & Oxygen Therapy: Patient placed on Ventilator (see vital sign flow sheet for setting) and Pt. with TV 150; awake following directions  but clinically weak . Plan overnight post-op ventilation per Dr. Jule Ser;  Post-op Assessment: Post -op Vital signs reviewed and stable and *Left side remains much weaker than right but all 4 extremities weak.**  Post vital signs: Reviewed and stable  Complications: No apparent anesthesia complications

## 2012-01-09 NOTE — Progress Notes (Signed)
eLink Physician-Brief Progress Note Patient Name: DAVIT VASSAR DOB: September 30, 1945 MRN: [id number]  Date of Service  01/09/2012   HPI/Events of Note     eICU Interventions  Protonix for SUP   Intervention Category Intermediate Interventions: Best-practice therapies (e.g. DVT, beta blocker, etc.)  Lori-Ann Lindfors V. 01/09/2012, 11:56 PM

## 2012-01-09 NOTE — Progress Notes (Signed)
Filed Vitals:   01/08/12 2130 01/09/12 0200 01/09/12 0604 01/09/12 1000  BP: 119/80 104/70 108/67 122/83  Pulse: 92 84 85 81  Temp: 100.1 F (37.8 C) 98.7 F (37.1 C) 98.3 F (36.8 C) 98.8 F (37.1 C)  TempSrc: Oral   Oral  Resp: 18 16 16 18   Height:      Weight:      SpO2: 93% 95% 95% 95%    Asked to see patient by Dr. Marikay Alar, whom I took over for at 1500. He had been concerned about this patient because of increased weakness, and obtained an MRI scan of the cervical spine, and was called shortly before I took over, because of a large fluid collection in the surgical plane, compressing the spinal cord.  I came in to see the patient, I reviewed a photocopy of the office neurosurgical consultation from 11/12/2011, spoke with the patient about his condition prior to surgery and his condition and progress since surgery, and examined him. The patient explains that he feels that he is weaker as compared to prior surgery. He explained that he was able to walk into the hospital with the use of a single-point cane, and although he did walk about 50 feet with a rolling walker 2 days ago, he feels that he is weaker now. He also feels that he has less use of his upper extremities. He denies any headache.  We examined his incision (taking down the dressing), the incision itself is healing nicely, there is moderate swelling in the posterior soft tissues of the neck, but no evidence of bruising, erythema, or drainage. However the patient notes that He did have a moderate amount of drainage from the wound over the first few days following surgery.  Motor examination at this time shows the deltoids are 4 minus bilaterally, the left biceps is 4 minus, the right it is 4-4+, the left triceps and intrinsics are 0-1, the right triceps and intrinsics are 4 minus, the grip on the left is 2 and on the right is 4 minus, the left iliopsoas is 4 minus, the right iliopsoas is 4+, the quadriceps and dorsiflexor and  plantar flexor are 5 bilaterally.   Plan: I spoke with the patient at length at his bedside, and subsequently spoke with the patient's son, Joelyn Oms, by cell phone, about his condition, the MRI findings, and a recommendation for exploration of the wound and evacuation of the fluid collection. I've explained that the fluid collection may be a hematoma or a CSF collection or some other type of fluid collection. I further explained that without surgical decompression I do not feel that he is going to recover motor function, but there are certainly risks as well with surgery. Between the light of his recent surgery less than a week ago and his underlying diabetes. These risks include risks of infection, which are increased due to his diabetes as well as recent surgery, the risk of bleeding and possible need for transfusion, the risk of increased neurologic dysfunction including complete paralysis of his upper and lower extremities, possible reaccumulation of this fluid collection and possibly for further surgery, and anesthetic risks of myocardial function, stroke, pneumonia, and death. I've explained that postoperatively will most likely want to monitor him in the intensive care unit, and that we may well lead drains in the surgical plane to evacuate any reaccumulation of the fluid collection.  Understanding all this the patient does want to go ahead with surgery, and we have contacted the  operating room to schedule surgery for late this afternoon/early this evening, the patient's and son's questions were answered for them.  Hewitt Shorts, MD 01/09/2012, 4:11 PM

## 2012-01-09 NOTE — Progress Notes (Signed)
To OR in bed.

## 2012-01-10 DIAGNOSIS — J9589 Other postprocedural complications and disorders of respiratory system, not elsewhere classified: Secondary | ICD-10-CM

## 2012-01-10 DIAGNOSIS — J95821 Acute postprocedural respiratory failure: Secondary | ICD-10-CM | POA: Diagnosis not present

## 2012-01-10 DIAGNOSIS — R0902 Hypoxemia: Secondary | ICD-10-CM

## 2012-01-10 LAB — CORTISOL: Cortisol, Plasma: 12 ug/dL

## 2012-01-10 LAB — GLUCOSE, CAPILLARY
Glucose-Capillary: 229 mg/dL — ABNORMAL HIGH (ref 70–99)
Glucose-Capillary: 223 mg/dL — ABNORMAL HIGH (ref 70–99)

## 2012-01-10 MED ORDER — INSULIN ASPART 100 UNIT/ML ~~LOC~~ SOLN
0.0000 [IU] | SUBCUTANEOUS | Status: DC
  Administered 2012-01-10 (×3): 3 [IU] via SUBCUTANEOUS

## 2012-01-10 MED ORDER — FENTANYL CITRATE 0.05 MG/ML IJ SOLN
12.5000 ug | INTRAMUSCULAR | Status: DC | PRN
  Administered 2012-01-10 – 2012-01-11 (×5): 25 ug via INTRAVENOUS
  Filled 2012-01-10 (×5): qty 2

## 2012-01-10 MED ORDER — INSULIN ASPART 100 UNIT/ML ~~LOC~~ SOLN
0.0000 [IU] | SUBCUTANEOUS | Status: DC
  Administered 2012-01-10: 3 [IU] via SUBCUTANEOUS
  Administered 2012-01-10 (×2): 5 [IU] via SUBCUTANEOUS
  Administered 2012-01-10: 11 [IU] via SUBCUTANEOUS
  Administered 2012-01-11: 3 [IU] via SUBCUTANEOUS
  Administered 2012-01-11: 8 [IU] via SUBCUTANEOUS
  Administered 2012-01-11: 3 [IU] via SUBCUTANEOUS
  Administered 2012-01-11: 8 [IU] via SUBCUTANEOUS
  Administered 2012-01-11: 11 [IU] via SUBCUTANEOUS
  Administered 2012-01-12: 3 [IU] via SUBCUTANEOUS
  Administered 2012-01-12: 5 [IU] via SUBCUTANEOUS
  Administered 2012-01-12 (×2): 2 [IU] via SUBCUTANEOUS

## 2012-01-10 MED ORDER — DEXTROSE-NACL 5-0.9 % IV SOLN
INTRAVENOUS | Status: DC
  Administered 2012-01-10: 09:00:00 via INTRAVENOUS

## 2012-01-10 MED ORDER — DEXTROSE 10 % IV SOLN: INTRAVENOUS | Status: DC | PRN

## 2012-01-10 NOTE — Progress Notes (Signed)
UR completed 

## 2012-01-10 NOTE — Progress Notes (Signed)
INITIAL ADULT NUTRITION ASSESSMENT Date: 01/10/2012   Time: 10:57 AM  Reason for Assessment: Consult  ASSESSMENT: Male 66 y.o.  Dx: cervical laminectomy wound and epidural hematoma with spinal cord compression  Hx:  Past Medical History  Diagnosis Date  . Neuromuscular disorder   . Hypertension   . High cholesterol   . Type II diabetes mellitus   . Arthritis     "right knee; left hip"    Related Meds:     . antiseptic oral rinse  15 mL Mouth Rinse QID  . bacitracin      . ceFAZolin      . chlorhexidine  15 mL Mouth Rinse BID  . dexamethasone  4 mg Intravenous Q6H  . docusate sodium  100 mg Oral BID  . insulin aspart  0-15 Units Subcutaneous Q4H  . simvastatin  20 mg Oral q1800  . sodium chloride      . DISCONTD: dexamethasone  4 mg Intravenous Q6H  . DISCONTD: famotidine (PEPCID) IV  20 mg Intravenous Q12H  . DISCONTD: glipiZIDE  5 mg Oral BID AC  . DISCONTD: insulin aspart  0-15 Units Subcutaneous TID WC  . DISCONTD: insulin aspart  0-3 Units Subcutaneous Q4H  . DISCONTD: insulin regular  0-10 Units Intravenous TID WC  . DISCONTD: lisinopril  10 mg Oral Daily  . DISCONTD: metFORMIN  1,000 mg Oral BID WC  . DISCONTD: pantoprazole (PROTONIX) IV  40 mg Intravenous Q24H  . DISCONTD: sodium chloride  3 mL Intravenous Q12H    Ht: 6' (182.9 cm)  Wt: 162 lb 0.6 oz (73.5 kg)  Ideal Wt: 81 kg % Ideal Wt: 91%  Usual Wt: 185 lb % Usual Wt: 87%  Body mass index is 21.98 kg/(m^2).  Food/Nutrition Related Hx: no triggers per admission nutrition screen  Labs:  CMP     Component Value Date/Time   NA 130* 01/09/2012 2112   K 5.1 01/09/2012 2112   CL 92* 01/09/2012 2112   CO2 28 01/09/2012 2112   GLUCOSE 226* 01/09/2012 2112   BUN 17 01/09/2012 2112   CREATININE 0.60 01/09/2012 2112   CALCIUM 9.5 01/09/2012 2112   PROT 6.5 01/09/2012 2112   ALBUMIN 2.6* 01/09/2012 2112   AST 40* 01/09/2012 2112   ALT 38 01/09/2012 2112   ALKPHOS 64 01/09/2012 2112   BILITOT 0.3 01/09/2012  2112   GFRNONAA >90 01/09/2012 2112   GFRAA >90 01/09/2012 2112     Intake/Output Summary (Last 24 hours) at 01/10/12 1100 Last data filed at 01/10/12 1000  Gross per 24 hour  Intake   2725 ml  Output   3310 ml  Net   -585 ml    CBG (last 3)   Basename 01/09/12 2150 01/09/12 1620 01/09/12 1123  GLUCAP 204* 93 195*    Diet Order: NPO  Supplements/Tube Feeding: N/A  IVF:    dextrose 5 % and 0.9% NaCl Last Rate: 50 mL/hr at 01/10/12 1000  DISCONTD: 0.45 % NaCl with KCl 20 mEq / L Last Rate: 100 mL/hr at 01/05/12 1610  DISCONTD: sodium chloride   DISCONTD: sodium chloride   DISCONTD: sodium chloride Last Rate: 75 mL/hr (01/09/12 2339)  DISCONTD: dextrose   DISCONTD: dextrose 5 % and 0.45% NaCl   DISCONTD: insulin (NOVOLIN-R) infusion   DISCONTD: lactated ringers     Estimated Nutritional Needs:   Kcal: 1900-2100 Protein: 90-100 gm Fluid: 1.8-2.0  Patient s/p posterior cervical laminectomy 6/18; exploration of posterior cervical wound and evacuation of wound  and epidural hematoma 6/23; extubated post-op at 09:15 this AM; RD consulted for assessment and recommendations for tube feeding; prior to surgery patient was consuming 75-100% on a Carbohydrate Modified Calorie diet (on 3000); patient reports weight stability & good appetite prior to admission; patient with surgical incisions to neck; per RN, likely will have swallow evaluation & hopefully PO diet can be advanced soon -- RD to follow for nutrition care plan.  NUTRITION DIAGNOSIS: -Inadequate oral intake (NI-2.1).  Status: Ongoing  RELATED TO: inability to eat, s/p extubation  AS EVIDENCE BY: NPO status  MONITORING/EVALUATION(Goals): Goal: Oral intake vs nutrition support to meet >90% of estimated nutrition needs Monitor: PO diet advancement, nutrition support initiation, weight, labs, I/O's  EDUCATION NEEDS: -No education needs identified at this time  INTERVENTION:  Advance diet as medically  appropriate  RD to follow for nutrition care plan, add interventions/make recommendations accordingly  Dietitian #: [phone]  DOCUMENTATION CODES Per approved criteria  -Not Applicable    Alger Memos 01/10/2012, 10:57 AM

## 2012-01-10 NOTE — Progress Notes (Signed)
6/24- 0910- Pt suctioned and extubated to 4lpm nasal cannula.  Pt sats 100% with rr20.  Pt vocalizing s/p extubation. Pt tolerated procedure well. RN at bedside for procedure.  Vent on stand-by at bedside.  Will wean oxygen as tolerated.   S Seanmichael Salmons rrt, rcp

## 2012-01-10 NOTE — Consult Note (Signed)
Name: [name] MRN: [id number] DOB: 1946/03/10    LOS: 6  PULMONARY / CRITICAL CARE MEDICINE PROGRESS NOTE  HPI:  66 year old male patient of Dr. Phoebe Perch with PMH relevant for HTN, DM and dyslipidemia. Admitted on 6/18 for elective posterior C2-T1 laminectomy and fusion for cervical stenosis with myelopathy. Over the last few hours the patient developed progressive weakness and this prompted to order an MRI of the neck. The study showed a large fluid collection in the surgical plane compressing the spinal cord. He was taken emergently to the OR for surgical drainage and decompression. The patient underwent surgery without apparent complications and was transferred to the neurosurgical ICU intubated. At the time of my exam the patient is intubated, awake, alert, able to follow commands. Critical care consult called for Mechanical ventilation management.     SUBJ: RASS 0. Passed SBT. Extubated and looks good   Vital Signs: Temp:  [97.8 F (36.6 C)-100.2 F (37.9 C)] 98.1 F (36.7 C) (06/24 1600) Pulse Rate:  [64-103] 65  (06/24 1800) Resp:  [13-20] 15  (06/24 1800) BP: (80-149)/(58-93) 113/68 mmHg (06/24 1800) SpO2:  [89 %-100 %] 100 % (06/24 1800) FiO2 (%):  [36 %-100 %] 36 % (06/24 0914) Weight:  [73.5 kg (162 lb 0.6 oz)] 73.5 kg (162 lb 0.6 oz) (06/24 0500)  Physical Examination: General: no acute distress Neuro:  Awake, following commands. Left hemiparesis - improved. HEENT:  PERRLA, pink conjunctivae, moist membranes Neck:  Rigid collar in place  Cardiovascular:  RRR, no M/R/G Lungs:  Coarse breath sounds bilaterally. No wheezing or crackles. Abdomen:  Soft, nontender, nondistended, bowel sounds present Musculoskeletal:  No edema, cyanosis or clubbing. Skin:  No rash    ASSESSMENT AND PLAN: 1) VDRF after repeat C-spine surgery for post operative hematoma  - Extubated and looks good. - Routine post extubation care   2) DM2 -Cont ICU hyperglycemia  protocol    Discussed with Dr Isa Rankin, MD ; [facility] service Mobile [phone].  After 5:30 PM or weekends, call [phone]

## 2012-01-10 NOTE — Evaluation (Signed)
Physical Therapy Evaluation Patient Details Name: [name] MRN: [id number] DOB: 26-Apr-1946 Today's Date: 01/10/2012 Time: 8469-6295 PT Time Calculation (min): 53 min  PT Assessment / Plan / Recommendation Clinical Impression  Pt is 66 y/o male admitted for s/p posterior cervical laminectomy c2-T1.  Pt with progressive bilateral weakness and CT noted fluid compressing on spinal cord.  Removal of fluid 01/09/12.  Pt with noticeable strength differences therefore reevaluation performed.    PT Assessment  Patient needs continued PT services    Follow Up Recommendations  Inpatient Rehab    Barriers to Discharge        lEquipment Recommendations  Defer to next venue    Recommendations for Other Services Rehab consult   Frequency Min 5X/week    Precautions / Restrictions Precautions Precautions: Fall;Cervical Precaution Comments: Educated pt on cervical precautions. Required Braces or Orthoses: Cervical Brace Cervical Brace: Hard collar Restrictions Weight Bearing Restrictions: No   Pertinent Vitals/Pain No c/o pain      Mobility  Bed Mobility Bed Mobility: Supine to Sit;Sitting - Scoot to Edge of Bed Supine to Sit: 3: Mod assist;HOB elevated Sitting - Scoot to Edge of Bed: 2: Max assist Details for Bed Mobility Assistance: Requires assistance to lift shoulders off bed.  Assist to reciprocally scoot hips to EOB.  Pt. attempts to scoot, but unable without assist Transfers Sit to Stand: 1: +2 Total assist;From elevated surface;From bed;From chair/3-in-1;With upper extremity assist Sit to Stand: Patient Percentage: 40% Stand to Sit: 1: +2 Total assist;To chair/3-in-1 Stand to Sit: Patient Percentage: 50% Details for Transfer Assistance: pt. with posterior bias. Pt. initially requires assistance to advance Rt. LE, but after repetition, pt. able to advance without assistance    Exercises     PT Diagnosis: Difficulty walking;Generalized weakness;Acute pain;Abnormality of  gait  PT Problem List: Decreased strength;Decreased range of motion;Decreased activity tolerance;Decreased balance;Decreased mobility;Decreased knowledge of use of DME;Pain;Impaired sensation PT Treatment Interventions: DME instruction;Gait training;Stair training;Functional mobility training;Therapeutic activities;Therapeutic exercise;Balance training;Patient/family education   PT Goals Acute Rehab PT Goals PT Goal Formulation: With patient Time For Goal Achievement: 01/24/12 Potential to Achieve Goals: Good Pt will Roll Supine to Left Side: with modified independence PT Goal: Rolling Supine to Left Side - Progress: Goal set today Pt will go Supine/Side to Sit: with modified independence PT Goal: Supine/Side to Sit - Progress: Goal set today Pt will go Sit to Supine/Side: with min assist PT Goal: Sit to Supine/Side - Progress: Goal set today Pt will go Sit to Stand: with min assist PT Goal: Sit to Stand - Progress: Goal set today Pt will go Stand to Sit: with min assist PT Goal: Stand to Sit - Progress: Goal set today Pt will Stand: 3 - 5 min;with min assist PT Goal: Stand - Progress: Goal set today Pt will Ambulate: 16 - 50 feet;with rolling walker;with +2 total assist (70%) PT Goal: Ambulate - Progress: Goal set today  Visit Information  Last PT Received On: 01/10/12 Assistance Needed: +2 PT/OT Co-Evaluation/Treatment: Yes    Subjective Data  Subjective: "I would like more intense rehab if possible." Patient Stated Goal: To be independent and be able to spend to time with my grand babies."   Prior Functioning  Home Living Lives With: Alone Available Help at Discharge: Family;Available PRN/intermittently Type of Home: Apartment Home Access: Elevator Home Layout: One level Bathroom Shower/Tub: Forensic scientist: Standard Bathroom Accessibility: Yes How Accessible: Accessible via walker Home Adaptive Equipment: Straight cane Prior Function Level of  Independence:  Independent Able to Take Stairs?: Yes Driving: Yes Vocation: Unemployed Communication Communication: No difficulties Dominant Hand: Right    Cognition  Overall Cognitive Status: Appears within functional limits for tasks assessed/performed Arousal/Alertness: Awake/alert Orientation Level: Appears intact for tasks assessed Behavior During Session:  [facility] for tasks performed    Extremity/Trunk Assessment Right Upper Extremity Assessment RUE ROM/Strength/Tone: Deficits RUE ROM/Strength/Tone Deficits: Rt. shoulder 3/5; elbow flex/ext 4/5; forearm 4/5; wrist 4/5; digits 4-/5 grip.  Unable to oppose digit f RUE Sensation: Deficits RUE Sensation Deficits: Pt. reports diminshed light touch fingertips RUE Coordination: Deficits RUE Coordination Deficits: Difficulty manipulating small options Left Upper Extremity Assessment LUE ROM/Strength/Tone: Deficits LUE ROM/Strength/Tone Deficits: shoulder 2/5; elbow flex/ext 3-5; sup/pron 3/5; wrist flex 2+/5; wrist ext 1/5; finger flex 2-/5; finger ext 1/5 LUE Sensation: Deficits LUE Sensation Deficits: diminished light touch LUE Coordination: Deficits LUE Coordination Deficits: unable to use Lt. UE functionally Right Lower Extremity Assessment RLE ROM/Strength/Tone: Deficits RLE ROM/Strength/Tone Deficits: 4+/5 quad, DF RLE Sensation: History of peripheral neuropathy (per pt) RLE Coordination: WFL - gross/fine motor Left Lower Extremity Assessment LLE ROM/Strength/Tone: Deficits LLE ROM/Strength/Tone Deficits: 3+/5 quad, 3+/5 hamstring, 3/5 hip flexors, 4/5 DF LLE Sensation: History of peripheral neuropathy (per pt) LLE Coordination: Deficits Trunk Assessment Trunk Assessment: Other exceptions Trunk Exceptions: pt. extends posteriorly   Balance Balance Balance Assessed: Yes Static Sitting Balance Static Sitting - Balance Support: Feet supported Static Sitting - Level of Assistance: 4: Min assist Static Sitting - Comment/# of  Minutes: 5 Static Standing Balance Static Standing - Balance Support: Right upper extremity supported  End of Session     Ninoska Goswick 01/10/2012, 3:11 PM Jake Shark, PT DPT [phone]

## 2012-01-10 NOTE — Progress Notes (Signed)
PT Cancellation Note  Treatment cancelled today due to medical issues with patient which prohibited therapy.  Pt currently intubated and plans to extubate later today.  Will attempt to see this PM if time allows and pt appropriate.  Thanks!!!  Edon Hoadley 01/10/2012, 8:55 AM Jake Shark, PT DPT [phone]

## 2012-01-10 NOTE — Progress Notes (Signed)
Subjective: Patient intubated - nods apprropiately  Objective: Vital signs in last 24 hours: Temp:  [97.8 F (36.6 C)-100.2 F (37.9 C)] 99.2 F (37.3 C) (06/24 0800) Pulse Rate:  [68-103] 79  (06/24 0814) Resp:  [13-18] 14  (06/24 0814) BP: (80-149)/(58-93) 107/66 mmHg (06/24 0814) SpO2:  [95 %-100 %] 100 % (06/24 0814) FiO2 (%):  [36 %-100 %] 36 % (06/24 0914) Weight:  [73.5 kg (162 lb 0.6 oz)] 73.5 kg (162 lb 0.6 oz) (06/24 0500)  Intake/Output from previous day: 06/23 0701 - 06/24 0700 In: 2525 [I.V.:2525] Out: 2620 [Urine:2470; Drains:120; Blood:30] Intake/Output this shift: Total I/O In: 75 [I.V.:75] Out: 250 [Urine:250]  A,A nods appropriately to questions - moves all 4 , slightly weaker in Right UE compared to Friday, incision - c/d/i  Lab Results:  [facility] 01/09/12 2112  WBC 11.5*  HGB 11.3*  HCT 32.9*  PLT 344   BMET  Basename 01/09/12 2112  NA 130*  K 5.1  CL 92*  CO2 28  GLUCOSE 226*  BUN 17  CREATININE 0.60  CALCIUM 9.5    Studies/Results: Mr Cervical Spine Wo Contrast  01/09/2012  *RADIOLOGY REPORT*  Clinical Data: Postop cervical decompression and posterior fusion. Increasing weakness.  MRI CERVICAL SPINE WITHOUT CONTRAST  Technique:  Multiplanar and multiecho pulse sequences of the cervical spine, to include the craniocervical junction and cervicothoracic junction, were obtained according to standard protocol without intravenous contrast.  Comparison: MRI 11/18/2011  Findings: Preexisting ACDF with plate at H0-8.  There is diffuse cervical spondylosis.  There has been recent total laminectomy of C3 through C7.  Posterior screw and rod fusion extends from C2-C7 bilaterally.  There is a large fluid collection in the surgical bed extending from C1-C7 and extending down to the dura.     There is marked compression of the cord due to the   fluid collection. There is cord compression extending from C3 through C6.  There is some blood within the fluid but  most of  the fluid is   simple fluid indicative of CSF leak.  There was hyperintensity in the cord at C4-5 on the preop study, this remains present.  Cord signal is difficult to evaluate as the cord  is markedly compressed and very thin.  There may be some edema in the cord at the C2 level.  There is multilevel spondylosis throughout the cervical spine which is advanced.  This is present from C2 through T1.  IMPRESSION: Postop laminectomy C3-C7.  Posterior hardware fusion C2-C7.  Large fluid collection is present from C1-C7  compatible with CSF leak containing a small amount of blood.  This is compressing the cord from C3-C7, with severe spinal stenosis.  There is some probable cord edema at the C2 level.  I discussed the findings by telephone with Dr. Yetta Barre on 01/09/2012 at 1430 hours.  Original Report Authenticated By: Camelia Phenes, M.D.   Dg Chest Port 1 View  01/09/2012  *RADIOLOGY REPORT*  Clinical Data: 66 year old male respiratory failure, intubated.  PORTABLE CHEST - 1 VIEW  Comparison: 12/28/2011.  Findings: AP portable semi upright view 2112 hours.  Endotracheal tube tip projects over the tracheal air column between level of clavicles and carina, about 5 cm proximal to the carina.  Lower cervical anterior and posterior fusion hardware partially visible with overlying skin staples.  The patient is rotated to the right. Lower lung volumes.  Grossly stable mediastinal contours.  No pneumothorax, pulmonary edema, pleural effusion or consolidation identified.  Gas in the gastric fundus.  IMPRESSION: 1.  Endotracheal tube in good position, tip between level of clavicles and carina. 2.  Rotated.  Lower lung volumes. 3.  Interval postoperative changes to the cervical spine.  Original Report Authenticated By: Harley Hallmark, M.D.    Assessment/Plan: Appreciate Crit care input - will extubate pt today - resume tx's and increase activity - will consult rehab  LOS: 6 days     Glenroy Crossen R,  MD 01/10/2012, 9:15 AM

## 2012-01-10 NOTE — Evaluation (Signed)
Occupational Therapy Re -Evaluation Patient Details Name: [name] MRN: [id number] DOB: 01-05-1946 Today's Date: 01/10/2012 Time: 3086-5784 OT Time Calculation (min): 47 min  OT Assessment / Plan / Recommendation Clinical Impression  This 66 y.o. male admitted initially for cervical laminectomy C2-T1.  Pt. developed hemotoma with spinal cord compression.  Underwent evacuation 01/09/12.  Pt. extubated 01/10/12 a.m.   Pt. demonstrates bil. UE weakness Lt. greater than Rt; impaired sensation; impaired LE strength, impaired balance.  Feel pt. will benefit from OT to maximize safety and independence with BADLs to allow him to return to supervision level after CIR.   Feel pt. would benefit from CIR.  Pt. reports son and dtr can assist at discharge if needed    OT Assessment  Patient needs continued OT Services    Follow Up Recommendations  Inpatient Rehab;Supervision/Assistance - 24 hour    Barriers to Discharge Decreased caregiver support    Equipment Recommendations  Defer to next venue    Recommendations for Other Services Rehab consult  Frequency  Min 2X/week    Precautions / Restrictions Precautions Precautions: Fall;Cervical Precaution Comments: Educated pt on cervical precautions. Required Braces or Orthoses: Cervical Brace Cervical Brace: Hard collar Restrictions Weight Bearing Restrictions: No       ADL  Eating/Feeding: Simulated;Set up Where Assessed - Eating/Feeding: Chair Grooming: Simulated;Wash/dry hands;Wash/dry face;Teeth care;Brushing hair;Minimal assistance Where Assessed - Grooming: Supported sitting Upper Body Bathing: Simulated;Moderate assistance Where Assessed - Upper Body Bathing: Supported sitting Lower Body Bathing: Simulated;Maximal assistance Where Assessed - Lower Body Bathing: Supported sit to stand Upper Body Dressing: Simulated;Maximal assistance Where Assessed - Upper Body Dressing: Supported sitting Lower Body Dressing: Simulated;+1 Total  assistance Where Assessed - Lower Body Dressing: Sopported sit to stand Toilet Transfer: Simulated;+2 Total assistance Toilet Transfer: Patient Percentage: 40% Statistician Method: Sit to Barista: Materials engineer and Hygiene: Simulated;+1 Total assistance Where Assessed - Glass blower/designer Manipulation and Hygiene: Standing Transfers/Ambulation Related to ADLs: Pt. moved sit to stand with total A +2 (pt. ~40%) and pivoted to recliner with total A +2 (pt. ~50%) ADL Comments: Pt. requires min A for EOB sitting.  Is very motivated    OT Diagnosis: Generalized weakness;Acute pain;Paresis  OT Problem List: Decreased strength;Decreased range of motion;Decreased activity tolerance;Impaired balance (sitting and/or standing);Decreased coordination;Decreased knowledge of use of DME or AE;Decreased knowledge of precautions;Impaired UE functional use;Pain OT Treatment Interventions: Self-care/ADL training;Therapeutic exercise;Neuromuscular education;DME and/or AE instruction;Manual therapy;Splinting;Therapeutic activities;Patient/family education;Balance training   OT Goals Acute Rehab OT Goals OT Goal Formulation: With patient Time For Goal Achievement: 01/24/12 Potential to Achieve Goals: Good ADL Goals Pt Will Perform Eating: with modified independence;Supported;Sitting, chair ADL Goal: Eating - Progress: Goal set today Pt Will Perform Grooming: with min assist;Standing at sink ADL Goal: Grooming - Progress: Goal set today Pt Will Perform Upper Body Bathing: with supervision;Sitting, chair ADL Goal: Upper Body Bathing - Progress: Goal set today Pt Will Perform Lower Body Bathing: with min assist;Sit to stand from chair ADL Goal: Lower Body Bathing - Progress: Goal set today Pt Will Perform Upper Body Dressing: with min assist;Sitting, chair ADL Goal: Upper Body Dressing - Progress: Goal set today Pt Will Perform Lower Body Dressing:  with mod assist;Sit to stand from chair ADL Goal: Lower Body Dressing - Progress: Goal set today Pt Will Transfer to Toilet: with min assist;Ambulation;3-in-1 ADL Goal: Toilet Transfer - Progress: Goal set today Additional ADL Goal #1: Pt. will demonstrate at least 1/2 grade strength improvement  Lt. UE in prep for use during functional activities ADL Goal: Additional Goal #1 - Progress: Goal set today Arm Goals Additional Arm Goal #1: Pt will independently perform bil UE HEP to increase strength and ROM in prep for ADLs. Arm Goal: Additional Goal #1 - Progress: Goal set today  Visit Information  Last OT Received On: 01/10/12 Assistance Needed: +2 PT/OT Co-Evaluation/Treatment: Yes    Subjective Data  Subjective: "I want to be independent" Patient Stated Goal: To reagain 100% of function.  If not 100%, then 85%   Prior Functioning  Home Living Lives With: Alone Available Help at Discharge: Family;Available PRN/intermittently Type of Home: Apartment Home Access: Elevator Home Layout: One level Bathroom Shower/Tub: Forensic scientist: Standard Bathroom Accessibility: Yes How Accessible: Accessible via walker Home Adaptive Equipment: Straight cane Prior Function Level of Independence: Independent Able to Take Stairs?: Yes Driving: Yes Vocation: Unemployed Communication Communication: No difficulties Dominant Hand: Right    Cognition  Overall Cognitive Status: Appears within functional limits for tasks assessed/performed Arousal/Alertness: Awake/alert Orientation Level: Appears intact for tasks assessed Behavior During Session: [facility] for tasks performed    Extremity/Trunk Assessment Right Upper Extremity Assessment RUE ROM/Strength/Tone: Deficits RUE ROM/Strength/Tone Deficits: Rt. shoulder 3/5; elbow flex/ext 4/5; forearm 4/5; wrist 4/5; digits 4-/5 grip.  Unable to oppose digit f RUE Sensation: Deficits RUE Sensation Deficits: Pt. reports diminshed  light touch fingertips RUE Coordination: Deficits RUE Coordination Deficits: Difficulty manipulating small options Left Upper Extremity Assessment LUE ROM/Strength/Tone: Deficits LUE ROM/Strength/Tone Deficits: shoulder 2/5; elbow flex/ext 3-5; sup/pron 3/5; wrist flex 2+/5; wrist ext 1/5; finger flex 2-/5; finger ext 1/5 LUE Sensation: Deficits LUE Sensation Deficits: diminished light touch LUE Coordination: Deficits LUE Coordination Deficits: unable to use Lt. UE functionally Trunk Assessment Trunk Assessment: Other exceptions Trunk Exceptions: pt. extends posteriorly   Mobility Bed Mobility Bed Mobility: Supine to Sit;Sitting - Scoot to Edge of Bed Supine to Sit: 3: Mod assist;HOB elevated Sitting - Scoot to Edge of Bed: 2: Max assist Details for Bed Mobility Assistance: Requires assistance to lift shoulders off bed.  Assist to reciprocally scoot hips to EOB.  Pt. attempts to scoot, but unable without assist Transfers Transfers: Stand to Sit;Sit to Stand Sit to Stand: 1: +2 Total assist;From elevated surface;From bed;From chair/3-in-1;With upper extremity assist Sit to Stand: Patient Percentage: 40% Stand to Sit: 1: +2 Total assist;To chair/3-in-1 Stand to Sit: Patient Percentage: 50% Details for Transfer Assistance: pt. with posterior bias. Pt. initially requires assistance to advance Rt. LE, but after repetition, pt. able to advance without assistance   Exercise    Balance Balance Balance Assessed: Yes Static Sitting Balance Static Sitting - Balance Support: Feet supported Static Sitting - Level of Assistance: 4: Min assist Static Sitting - Comment/# of Minutes: 5 Static Standing Balance Static Standing - Balance Support: Right upper extremity supported  End of Session OT - End of Session Equipment Utilized During Treatment: Cervical collar Activity Tolerance: Patient tolerated treatment well Patient left: in chair;with call bell/phone within reach Nurse Communication:  Mobility status   Magnus Crescenzo, Ursula Alert M 01/10/2012, 3:04 PM

## 2012-01-10 NOTE — Progress Notes (Signed)
Clinical Social Worker continuing to follow for disposition planning. Clinical Social Worker met with pt and pt son at bedside. Clinical Social Worker provided support as pt discussed extubation today and the challenging weekend medically. Pt expressed eagerness for rehab and pt son discussed that bed has been accepted at [facility]. Pt inquired about information in regard to Social Security disability. Clinical Social Worker provided contact information for [employer] to pt and pt son and encourage pt to discuss with social worker at SNF who can assist with process. Clinical Social Worker contacted [facility] who confirmed that pt can admit to SNF when pt medically stable for discharge. Clinical Social Worker to continue to follow and facilitate pt discharge needs to [facility] when pt medically stable for discharge.  Jacklynn Lewis, MSW, LCSWA  Clinical Social Work [phone]

## 2012-01-10 NOTE — Progress Notes (Signed)
Inpatient Diabetes Program Recommendations  AACE/ADA: New Consensus Statement on Inpatient Glycemic Control (2009)  Target Ranges:  Prepandial:   less than 140 mg/dL      Peak postprandial:   less than 180 mg/dL (1-2 hours)      Critically ill patients:  140 - 180 mg/dL    Results for [name], [name] (MRN [id number]) as of 01/10/2012 14:14  Ref. Range 01/10/2012 04:58 01/10/2012 07:57 01/10/2012 11:46  Glucose-Capillary Latest Range: 70-99 mg/dL 725 (H) 366 (H) 440 (H)     Inpatient Diabetes Program Recommendations Insulin - Basal: May want to consider adding low dose Lantus while patient on IV Decadron- Lantus 10-15 units QHS  Note: Will follow. Ambrose Finland RN, MSN, CDE Diabetes Coordinator Inpatient Diabetes Program [phone]

## 2012-01-11 ENCOUNTER — Encounter (HOSPITAL_COMMUNITY): Payer: Self-pay | Admitting: Neurology

## 2012-01-11 DIAGNOSIS — M4712 Other spondylosis with myelopathy, cervical region: Secondary | ICD-10-CM

## 2012-01-11 DIAGNOSIS — R531 Weakness: Secondary | ICD-10-CM

## 2012-01-11 DIAGNOSIS — R5381 Other malaise: Secondary | ICD-10-CM

## 2012-01-11 DIAGNOSIS — R5383 Other fatigue: Secondary | ICD-10-CM

## 2012-01-11 DIAGNOSIS — E875 Hyperkalemia: Secondary | ICD-10-CM

## 2012-01-11 DIAGNOSIS — R0902 Hypoxemia: Secondary | ICD-10-CM

## 2012-01-11 LAB — GLUCOSE, CAPILLARY
Glucose-Capillary: 162 mg/dL — ABNORMAL HIGH (ref 70–99)
Glucose-Capillary: 318 mg/dL — ABNORMAL HIGH (ref 70–99)
Glucose-Capillary: 278 mg/dL — ABNORMAL HIGH (ref 70–99)

## 2012-01-11 MED ORDER — DEXAMETHASONE SODIUM PHOSPHATE 4 MG/ML IJ SOLN
2.0000 mg | Freq: Four times a day (QID) | INTRAMUSCULAR | Status: DC
  Administered 2012-01-11: 2 mg via INTRAVENOUS

## 2012-01-11 MED ORDER — SODIUM CHLORIDE 0.9 % IV SOLN
INTRAVENOUS | Status: DC
  Administered 2012-01-11: 16:00:00 via INTRAVENOUS

## 2012-01-11 MED ORDER — DEXAMETHASONE 2 MG PO TABS
2.0000 mg | ORAL_TABLET | Freq: Three times a day (TID) | ORAL | Status: DC
  Administered 2012-01-11 – 2012-01-12 (×4): 2 mg via ORAL
  Filled 2012-01-11 (×6): qty 1

## 2012-01-11 MED ORDER — OXYCODONE-ACETAMINOPHEN 5-325 MG PO TABS
2.0000 | ORAL_TABLET | ORAL | Status: DC | PRN
  Administered 2012-01-11 – 2012-01-12 (×3): 2 via ORAL
  Filled 2012-01-11 (×3): qty 2

## 2012-01-11 NOTE — Progress Notes (Signed)
Physical Therapy Treatment Patient Details Name: KOREY PRASHAD MRN: [id number] DOB: [birth year] Today's Date: 01/11/2012 Time:  -     PT Assessment / Plan / Recommendation Comments on Treatment Session  Patient progressing with ambulation and less fearful today of buckling. Patient would make a great CIR canidate. Will continue to work on standing balance and increasing ambulation tolerance    Follow Up Recommendations  Inpatient Rehab    Barriers to Discharge        Equipment Recommendations  Defer to next venue    Recommendations for Other Services Rehab consult  Frequency Min 5X/week   Plan Discharge plan remains appropriate    Precautions / Restrictions Precautions Precautions: Fall Required Braces or Orthoses: Cervical Brace Cervical Brace: Hard collar   Pertinent Vitals/Pain     Mobility  Bed Mobility Supine to Sit: 3: Mod assist;With rails Sitting - Scoot to Edge of Bed: 4: Min assist;4: Min guard Details for Bed Mobility Assistance: A required as patient pull up with PTAs hand and then required assistance for trunk control/positioning into sitting. Patient required cues for safe technique Transfers Sit to Stand: 1: +2 Total assist;With upper extremity assist;From bed;From elevated surface Sit to Stand: Patient Percentage: 50% Stand to Sit: 1: +2 Total assist;With upper extremity assist;With armrests;To chair/3-in-1 Stand to Sit: Patient Percentage: 50% Details for Transfer Assistance: Cues for safe technique and A to initiate stand and shift weight anteriorly over BOS. Cues for technique and to control descent to recliner.  Ambulation/Gait Ambulation/Gait Assistance: 1: +2 Total assist Ambulation/Gait: Patient Percentage: 50% Ambulation Distance (Feet): 8 Feet Assistive device: 2 person hand held assist Ambulation/Gait Assistance Details: Patient with hyperextension of L knee. A for weightshifting and to advance LE and maintain balance.  Gait Pattern:  Shuffle;Narrow base of support Gait velocity: decreased    Exercises     PT Diagnosis:    PT Problem List:   PT Treatment Interventions:     PT Goals Acute Rehab PT Goals PT Goal: Rolling Supine to Left Side - Progress: Progressing toward goal PT Goal: Supine/Side to Sit - Progress: Progressing toward goal PT Goal: Sit to Stand - Progress: Progressing toward goal PT Goal: Stand to Sit - Progress: Progressing toward goal PT Goal: Stand - Progress: Progressing toward goal PT Goal: Ambulate - Progress: Progressing toward goal  Visit Information       Subjective Data  Subjective: I feel better than I did yesterday   Cognition  Overall Cognitive Status: Appears within functional limits for tasks assessed/performed Arousal/Alertness: Awake/alert Orientation Level: Appears intact for tasks assessed Behavior During Session: [facility] for tasks performed    Balance  Static Standing Balance Static Standing - Balance Support: Bilateral upper extremity supported Static Standing - Level of Assistance: 3: Mod assist Static Standing - Comment/# of Minutes: x 5 min to shift weight side to side/ anterior/posteriorly then find COG.   End of Session PT - End of Session Equipment Utilized During Treatment: Gait belt Activity Tolerance: Patient tolerated treatment well Patient left: in chair Nurse Communication: Mobility status    Abelina Ketron, Adline Potter 01/11/2012, 1:31 PM  01/11/2012 Fredrich Birks PTA [phone] pager [phone] office

## 2012-01-11 NOTE — PMR Pre-admission (Signed)
PMR Admission Coordinator Pre-Admission Assessment  Patient: [name] is an 66 y.o., male MRN: [id number] DOB: 03/14/1946 Height: 6' (182.9 cm) Weight: 82.6 kg (182 lb 1.6 oz)  Insurance Information HMO:    PPO:      PCP:      IPA:      80/20: yes     OTHER: no hmo PRIMARY: Medicare a and b      Policy#: [id number]      Subscriber: pt CM Name:       Phone#:      Fax#:  Pre-Cert#:       Employer:  Benefits:  Phone #: vision share     Name: 01/12/12 Eff. Date: 08/20/11     Deduct: $1184      Out of Pocket Max: none      Life Max: none CIR: 100%      SNF: 20 full days LBD none Outpatient: 80%     Co-Pay: 20% Home Health: 100%      Co-Pay: none DME: 80%     Co-Pay: 20% Providers: pt choice   Emergency Contact Information Contact Information    Name Relation Home Work Oriska   [phone]     Current Medical History  Patient Admitting Diagnosis:Cervical myelopathy s/p C2-7 laminectomy and fusion   History of Present Illness: [name] is a 67 y.o. right-handed male admitted 01/04/2012 with weakness upper extremities that has steadily worsened. X-rays and imaging revealed cervical stenosis with myelopathy. Underwent posterior cervical laminectomy C2-T1 with posterior fusion C2-T1 01/04/2012 per Dr. Phoebe Perch. Postoperatively patient developed progressive weakness that prompted an MRI of the neck that showed a large fluid collection in the surgical plane compressing the spinal cord. Underwent emergent exploration of posterior cervical wound and evacuation of wound an epidural hematoma 01/09/2012 per Dr. Newell Coral. Patient has been fitted with cervical brace. Postoperative pain management.. Remains on Decadron protocol.    Past Medical History  Past Medical History  Diagnosis Date  . Neuromuscular disorder   . Hypertension   . High cholesterol   . Type II diabetes mellitus   . Arthritis     "right knee; left hip"    Family History  family history is  negative for Anesthesia problems.  Prior Rehab/Hospitalizations: [facility] 2000 after hip replacement  Current Medications  Current facility-administered medications:0.9 %  sodium chloride infusion, 250 mL, Intravenous, PRN, Kalman Shan, MD, Last Rate: 75 mL/hr at 01/10/12 0900, 250 mL at 01/10/12 0900;  0.9 %  sodium chloride infusion, , Intravenous, Continuous, Clydene Fake, MD, Last Rate: 50 mL/hr at 01/11/12 1545;  acetaminophen (TYLENOL) suppository 650 mg, 650 mg, Rectal, Q4H PRN, Clydene Fake, MD acetaminophen (TYLENOL) tablet 650 mg, 650 mg, Oral, Q4H PRN, Clydene Fake, MD;  antiseptic oral rinse (BIOTENE) solution 15 mL, 15 mL, Mouth Rinse, QID, Kalman Shan, MD, 15 mL at 01/12/12 0400;  bisacodyl (DULCOLAX) suppository 10 mg, 10 mg, Rectal, Daily PRN, Clydene Fake, MD;  chlorhexidine (PERIDEX) 0.12 % solution 15 mL, 15 mL, Mouth Rinse, BID, Kalman Shan, MD, 15 mL at 01/11/12 2000 cyclobenzaprine (FLEXERIL) tablet 10 mg, 10 mg, Oral, TID PRN, Clydene Fake, MD, 10 mg at 01/12/12 4401;  dexamethasone (DECADRON) tablet 2 mg, 2 mg, Oral, Q8H, Clydene Fake, MD, 2 mg at 01/12/12 0272;  docusate sodium (COLACE) capsule 100 mg, 100 mg, Oral, BID, Clydene Fake, MD, 100 mg at 01/11/12 2236;  fentaNYL (SUBLIMAZE) injection  12.5-25 mcg, 12.5-25 mcg, Intravenous, Q2H PRN, Merwyn Katos, MD, 25 mcg at 01/11/12 0129 insulin aspart (novoLOG) injection 0-15 Units, 0-15 Units, Subcutaneous, Q4H, Merwyn Katos, MD, 2 Units at 01/12/12 0802;  methocarbamol (ROBAXIN) 500 mg in dextrose 5 % 50 mL IVPB, 500 mg, Intravenous, Q6H PRN, Clydene Fake, MD;  methocarbamol (ROBAXIN) tablet 500 mg, 500 mg, Oral, Q6H PRN, Clydene Fake, MD, 500 mg at 01/06/12 0027 oxyCODONE-acetaminophen (PERCOCET) 5-325 MG per tablet 2 tablet, 2 tablet, Oral, Q4H PRN, Clydene Fake, MD, 2 tablet at 01/12/12 [phone];  promethazine (PHENERGAN) injection 12.5 mg, 12.5 mg, Intravenous, Q4H PRN, Clydene Fake, MD;   promethazine (PHENERGAN) tablet 12.5-25 mg, 12.5-25 mg, Oral, Q4H PRN, Clydene Fake, MD;  simvastatin (ZOCOR) tablet 20 mg, 20 mg, Oral, q1800, Clydene Fake, MD, 20 mg at 01/11/12 1739 DISCONTD: dexamethasone (DECADRON) injection 2 mg, 2 mg, Intravenous, Q6H, Clydene Fake, MD, 2 mg at 01/11/12 1313;  DISCONTD: dexamethasone (DECADRON) injection 4 mg, 4 mg, Intravenous, Q6H, Hessie Diener Bushnell, PHARMD, 4 mg at 01/11/12 9604;  DISCONTD: dextrose 5 %-0.9 % sodium chloride infusion, , Intravenous, Continuous, Merwyn Katos, MD, Last Rate: 50 mL/hr at 01/11/12 0900  Patients Current Diet: Carb Control  Precautions / Restrictions Precautions Precautions: Fall Precaution Comments: Educated pt on cervical precautions. Cervical Brace: Hard collar Restrictions Weight Bearing Restrictions: No   Prior Activity Level Community (5-7x/wk): patient was mod I with cane and lived alone Pharmacist, community / Equipment Home Assistive Devices/Equipment: Eyeglasses;Cane (specify quad or straight);Dentures (specify type);Grab bars in shower;Other (Comment) (straight cane; full upper; "chain for safety alert") Home Adaptive Equipment: Straight cane  Prior Functional Level Prior Function Level of Independence: Independent Able to Take Stairs?: Yes Driving: Yes Vocation: Unemployed Comments: retired Investment banker, operational for a [employer]  Current Functional Level Cognition  Arousal/Alertness: Awake/alert Overall Cognitive Status: Appears within functional limits for tasks assessed/performed Orientation Level: Oriented X4 Safety/Judgement: Decreased safety judgement for tasks assessed Cognition - Other Comments: slow processing, someones slurring his words a bit, difficulty sequencing movement especially with weak LUE, very anxious, almost self limiting in a way    Extremity Assessment (includes Sensation/Coordination)  RUE ROM/Strength/Tone: Deficits RUE ROM/Strength/Tone Deficits: Rt. shoulder 3/5;  elbow flex/ext 4/5; forearm 4/5; wrist 4/5; digits 4-/5 grip.  Unable to oppose digit f RUE Sensation: Deficits RUE Sensation Deficits: Pt. reports diminshed light touch fingertips RUE Coordination: Deficits RUE Coordination Deficits: Difficulty manipulating small options  RLE ROM/Strength/Tone: Deficits RLE ROM/Strength/Tone Deficits: 4+/5 quad, DF RLE Sensation: History of peripheral neuropathy (per pt) RLE Coordination: WFL - gross/fine motor    ADLs  Eating/Feeding: Simulated;Set up Where Assessed - Eating/Feeding: Chair Grooming: Simulated;Wash/dry hands;Wash/dry face;Teeth care;Brushing hair;Minimal assistance Where Assessed - Grooming: Supported sitting Upper Body Bathing: Simulated;Moderate assistance Where Assessed - Upper Body Bathing: Supported sitting Lower Body Bathing: Simulated;Maximal assistance Where Assessed - Lower Body Bathing: Supported sit to stand Upper Body Dressing: Simulated;Maximal assistance Where Assessed - Upper Body Dressing: Supported sitting Lower Body Dressing: Simulated;+1 Total assistance Where Assessed - Lower Body Dressing: Sopported sit to stand Toilet Transfer: Simulated;+2 Total assistance Toilet Transfer: Patient Percentage: 40% Statistician Method: Sit to Barista: Bedside commode Toileting - Clothing Manipulation and Hygiene: Simulated;+1 Total assistance Where Assessed - Engineer, mining and Hygiene: Standing Equipment Used: Gait belt;Rolling walker (aspen collar) Transfers/Ambulation Related to ADLs: Pt. moved sit to stand with total A +2 (pt. ~40%) and pivoted to recliner with total  A +2 (pt. ~50%) ADL Comments: Pt. requires min A for EOB sitting.  Is very motivated    Mobility  Bed Mobility: Supine to Sit;Sitting - Scoot to [place] of Bed Rolling Left: 4: Min guard Left Sidelying to Sit: 3: Mod assist Supine to Sit: 3: Mod assist;With rails Sitting - Scoot to Edge of Bed: 4: Min assist;4: Min  guard    Transfers  Transfers: Sit to Stand;Stand to Sit Sit to Stand: 1: +2 Total assist;With upper extremity assist;From bed;From elevated surface Sit to Stand: Patient Percentage: 50% Stand to Sit: 1: +2 Total assist;With upper extremity assist;With armrests;To chair/3-in-1 Stand to Sit: Patient Percentage: 50%    Ambulation / Gait / Stairs / Wheelchair Mobility  Ambulation/Gait Ambulation/Gait Assistance: 1: +2 Total assist Ambulation/Gait: Patient Percentage: 50% Ambulation Distance (Feet): 8 Feet Assistive device: 2 person hand held assist Ambulation/Gait Assistance Details: Patient with hyperextension of L knee. A for weightshifting and to advance LE and maintain balance.  Gait Pattern: Shuffle;Narrow base of support Gait velocity: decreased    Posture / Balance Static Sitting Balance Static Sitting - Balance Support: Feet supported Static Sitting - Level of Assistance: 4: Min assist Static Sitting - Comment/# of Minutes: 5 Static Standing Balance Static Standing - Balance Support: Bilateral upper extremity supported Static Standing - Level of Assistance: 3: Mod assist Static Standing - Comment/# of Minutes: x 5 min to shift weight side to side/ anterior/posteriorly then find COG.      Previous Home Environment Living Arrangements: Alone Lives With: Alone Available Help at Discharge: Family;Available 24 hours/day Type of Home:  (I living apartment) Home Layout: One level Home Access: Elevator Bathroom Shower/Tub: Forensic scientist: Standard Bathroom Accessibility: Yes How Accessible: Accessible via walker Home Care Services: No Type of Home Care Services: Other (Comment) (senior apartment, "have safety chain I can pull for help")  Discharge Living Setting Plans for Discharge Living Setting: Apartment Type of Home at Discharge: Apartment Discharge Home Layout: One level (senior living independent apartment) Discharge Home Access: Level  entry;Elevator Discharge Bathroom Shower/Tub: Tub/shower unit Discharge Bathroom Toilet: Standard Discharge Bathroom Accessibility: Yes How Accessible: Accessible via walker Do you have any problems obtaining your medications?: No  Social/Family/Support Systems Patient Roles: Other (Comment);Parent (legally seperated for 5 years. Stepmom to his son and daught) Contact Information: Mir Fullilove, son Anticipated Caregiver: son, family members very involved and supportive Anticipated Caregiver's Contact Information: cell [phone] Ability/Limitations of Caregiver: son works Financial risk analyst at [employer]; 24/7 can be arranged Caregiver Availability: 24/7 Discharge Plan Discussed with Primary Caregiver: Yes Is Caregiver In Agreement with Plan?: Yes Does Caregiver/Family have Issues with Lodging/Transportation while Pt is in Rehab?: No  Goals/Additional Needs Patient/Family Goal for Rehab: Mod I PT, supervision to min assist OT Expected length of stay: ELOS 2 weeks Special Service Needs: patient has a "bad " r knee. plan to have knee replacement in future Pt/Family Agrees to Admission and willing to participate: Yes Program Orientation Provided & Reviewed with Pt/Caregiver Including Roles  & Responsibilities: Yes  Patient Condition: This patient's condition remains as documented in the Consult dated 01/12/12, in which the Rehabilitation Physician determined and documented that the patient's condition is appropriate for intensive rehabilitative care in an inpatient rehabilitation facility.  Preadmission Screen Completed By:  Clois Dupes, 01/12/2012 9:55 AM ______________________________________________________________________   Discussed status with Dr. Riley Kill on 01/12/12 at  [phone] and received telephone approval for admission today.  Admission Coordinator:  Clois Dupes, time 6213 Date 01/12/12.

## 2012-01-11 NOTE — Consult Note (Signed)
Physical Medicine and Rehabilitation Consult Reason for Consult: Deconditioning/myelopathy Referring Physician: Dr. Phoebe Perch   HPI: [name] is a 66 y.o. right-handed male admitted 01/04/2012 with weakness upper extremities that has steadily worsened. X-rays and imaging revealed cervical stenosis with myelopathy. Underwent posterior cervical laminectomy C2-T1 with posterior fusion C2-T1 01/04/2012 per Dr. Phoebe Perch. Postoperatively patient developed progressive weakness that prompted an MRI of the neck that showed a large fluid collection in the surgical plane compressing the spinal cord. Underwent emergent exploration of posterior cervical wound and evacuation of wound an epidural hematoma 01/09/2012 per Dr. Newell Coral. Patient has been fitted with cervical brace. Postoperative pain management..  Remains on Decadron protocol. Physical and occupational therapy evaluations completed with recommendations for physical medicine rehabilitation consult to consider inpatient rehabilitation services   Review of Systems  HENT: Positive for neck pain.   Neurological: Positive for weakness.  All other systems reviewed and are negative.   Past Medical History  Diagnosis Date  . Neuromuscular disorder   . Hypertension   . High cholesterol   . Type II diabetes mellitus   . Arthritis     "right knee; left hip"   Past Surgical History  Procedure Date  . Anterior cervical decomp/discectomy fusion 2008  . Posterior fusion cervical spine 01/04/12  . Joint replacement     Left Hip Replacement  . Total hip arthroplasty 1999    left  . Excisional hemorrhoidectomy 1970's  . Inguinal hernia repair ~ 1975    left  . Posterior cervical laminectomy 01/04/2012    Procedure: POSTERIOR CERVICAL LAMINECTOMY;  Surgeon: Clydene Fake, MD;  Location: [facility];  Service: Neurosurgery;  Laterality: N/A;  Cervical Two to Thoracic One2  Decompressive Laminectomy, Fusion Cervical Two to Thoracic One with  Instrumentation   Family History  Problem Relation Age of Onset  . Anesthesia problems Neg Hx    Social History:  reports that he has been smoking Cigarettes.  He has a 35 pack-year smoking history. He has quit using smokeless tobacco. He reports that he drinks alcohol. He reports that he does not use illicit drugs. Allergies: No Known Allergies Medications Prior to Admission  Medication Sig Dispense Refill  . diclofenac (VOLTAREN) 75 MG EC tablet Take 75 mg by mouth 2 (two) times daily.      Marland Kitchen glipiZIDE (GLUCOTROL) 5 MG tablet Take 5 mg by mouth 2 (two) times daily before a meal.      . lisinopril (PRINIVIL,ZESTRIL) 10 MG tablet Take 10 mg by mouth daily.      . metFORMIN (GLUCOPHAGE) 1000 MG tablet Take 1,000 mg by mouth 2 (two) times daily with a meal.      . pravastatin (PRAVACHOL) 40 MG tablet Take 40 mg by mouth daily.        Home: Home Living Lives With: Alone Available Help at Discharge: Family;Available PRN/intermittently Type of Home: Apartment Home Access: Elevator Home Layout: One level Bathroom Shower/Tub: Forensic scientist: Standard Bathroom Accessibility: Yes How Accessible: Accessible via walker Home Adaptive Equipment: Straight cane  Functional History: Prior Function Able to Take Stairs?: Yes Driving: Yes Vocation: Unemployed Functional Status:  Mobility: Bed Mobility Bed Mobility: Supine to Sit;Sitting - Scoot to Edge of Bed Rolling Left: 3: Mod assist;With rail Left Sidelying to Sit: 3: Mod assist;HOB elevated;With rails Supine to Sit: 3: Mod assist;HOB elevated Sitting - Scoot to Edge of Bed: 2: Max assist Transfers Transfers: Sit to Stand;Stand to Sit Sit to Stand: 1: +2 Total assist;From  elevated surface;From bed;From chair/3-in-1;With upper extremity assist Sit to Stand: Patient Percentage: 40% Stand to Sit: 1: +2 Total assist;To chair/3-in-1 Stand to Sit: Patient Percentage: 50% Ambulation/Gait Ambulation/Gait Assistance:  1: +2 Total assist Ambulation/Gait: Patient Percentage: 40% Ambulation Distance (Feet): 2 Feet Assistive device: Rolling walker Ambulation/Gait Assistance Details: very limited ambulation today with two attempts, pt very scared, hyperextending his left knee ( likely weakness), difficulty advancing either leg because of pain and fear of RLE buckling, with even the slightly bit of buckling the patient gets very nervous and loses his control needing to be physically assisted to the chair  crying out "Im falling"  Gait Pattern: Trunk flexed;Left genu recurvatum;Decreased weight shift to right    ADL: ADL Eating/Feeding: Simulated;Set up Where Assessed - Eating/Feeding: Chair Grooming: Simulated;Wash/dry hands;Wash/dry face;Teeth care;Brushing hair;Minimal assistance Where Assessed - Grooming: Supported sitting Upper Body Bathing: Simulated;Moderate assistance Where Assessed - Upper Body Bathing: Supported sitting Lower Body Bathing: Simulated;Maximal assistance Where Assessed - Lower Body Bathing: Supported sit to stand Upper Body Dressing: Simulated;Maximal assistance Where Assessed - Upper Body Dressing: Supported sitting Lower Body Dressing: Simulated;+1 Total assistance Where Assessed - Lower Body Dressing: Sopported sit to stand Toilet Transfer: Simulated;+2 Total assistance Toilet Transfer Method: Sit to Barista: Bedside commode Equipment Used: Gait belt;Rolling walker (aspen collar) Transfers/Ambulation Related to ADLs: Pt. moved sit to stand with total A +2 (pt. ~40%) and pivoted to recliner with total A +2 (pt. ~50%) ADL Comments: Pt. requires min A for EOB sitting.  Is very motivated  Cognition: Cognition Arousal/Alertness: Awake/alert Orientation Level: Oriented X4 Cognition Overall Cognitive Status: Appears within functional limits for tasks assessed/performed Area of Impairment: Safety/judgement;Awareness of errors;Problem  solving Arousal/Alertness: Awake/alert Orientation Level: Appears intact for tasks assessed Behavior During Session: [facility] for tasks performed Safety/Judgement: Decreased safety judgement for tasks assessed Cognition - Other Comments: slow processing, someones slurring his words a bit, difficulty sequencing movement especially with weak LUE, very anxious, almost self limiting in a way  Blood pressure 102/56, pulse 53, temperature 97.6 F (36.4 C), temperature source Oral, resp. rate 14, height 6' (1.829 m), weight 78.7 kg (173 lb 8 oz), SpO2 100.00%. Physical Exam  Vitals reviewed. Constitutional: He is oriented to person, place, and time. He appears well-developed.  HENT:  Head: Normocephalic and atraumatic.  Right Ear: External ear normal.  Left Ear: External ear normal.  Nose: Nose normal.  Mouth/Throat: Oropharynx is clear and moist.  Eyes: Conjunctivae and EOM are normal. Pupils are equal, round, and reactive to light.  Cardiovascular: Normal rate and regular rhythm.   No murmur heard. Pulmonary/Chest: Effort normal and breath sounds normal. No respiratory distress.  Abdominal: Soft. Bowel sounds are normal. He exhibits no distension. There is no tenderness.  Musculoskeletal: He exhibits no edema.  Neurological: He is alert and oriented to person, place, and time.       Subtle sensory changes in the left upper extremity. Left upper extremity grossly 2/5 on manual muscle testing. Left lower extremity grossly 2-3/5. Right upper extremity 4/5 as with right lower extremity today. Reflexes were 1+ throughout. Cognitively he is intact.  Skin:       Surgical site clean and dry with cervical collar intact  Psychiatric: He has a normal mood and affect. His behavior is normal. Judgment and thought content normal.    Results for orders placed during the hospital encounter of 01/04/12 (from the past 24 hour(s))  GLUCOSE, CAPILLARY     Status: Abnormal   Collection  Time   01/10/12 11:46 AM       Component Value Range   Glucose-Capillary 223 (*) 70 - 99 mg/dL  GLUCOSE, CAPILLARY     Status: Abnormal   Collection Time   01/10/12  3:53 PM      Component Value Range   Glucose-Capillary 233 (*) 70 - 99 mg/dL  GLUCOSE, CAPILLARY     Status: Abnormal   Collection Time   01/10/12  8:12 PM      Component Value Range   Glucose-Capillary 328 (*) 70 - 99 mg/dL  GLUCOSE, CAPILLARY     Status: Abnormal   Collection Time   01/10/12 11:42 PM      Component Value Range   Glucose-Capillary 199 (*) 70 - 99 mg/dL  GLUCOSE, CAPILLARY     Status: Abnormal   Collection Time   01/11/12  4:10 AM      Component Value Range   Glucose-Capillary 162 (*) 70 - 99 mg/dL  GLUCOSE, CAPILLARY     Status: Abnormal   Collection Time   01/11/12  7:42 AM      Component Value Range   Glucose-Capillary 176 (*) 70 - 99 mg/dL   Mr Cervical Spine Wo Contrast  01/09/2012  *RADIOLOGY REPORT*  Clinical Data: Postop cervical decompression and posterior fusion. Increasing weakness.  MRI CERVICAL SPINE WITHOUT CONTRAST  Technique:  Multiplanar and multiecho pulse sequences of the cervical spine, to include the craniocervical junction and cervicothoracic junction, were obtained according to standard protocol without intravenous contrast.  Comparison: MRI 11/18/2011  Findings: Preexisting ACDF with plate at Z6-1.  There is diffuse cervical spondylosis.  There has been recent total laminectomy of C3 through C7.  Posterior screw and rod fusion extends from C2-C7 bilaterally.  There is a large fluid collection in the surgical bed extending from C1-C7 and extending down to the dura.     There is marked compression of the cord due to the   fluid collection. There is cord compression extending from C3 through C6.  There is some blood within the fluid but most of  the fluid is   simple fluid indicative of CSF leak.  There was hyperintensity in the cord at C4-5 on the preop study, this remains present.  Cord signal is difficult to evaluate  as the cord  is markedly compressed and very thin.  There may be some edema in the cord at the C2 level.  There is multilevel spondylosis throughout the cervical spine which is advanced.  This is present from C2 through T1.  IMPRESSION: Postop laminectomy C3-C7.  Posterior hardware fusion C2-C7.  Large fluid collection is present from C1-C7  compatible with CSF leak containing a small amount of blood.  This is compressing the cord from C3-C7, with severe spinal stenosis.  There is some probable cord edema at the C2 level.  I discussed the findings by telephone with Dr. Yetta Barre on 01/09/2012 at 1430 hours.  Original Report Authenticated By: Camelia Phenes, M.D.   Dg Chest Port 1 View  01/09/2012  *RADIOLOGY REPORT*  Clinical Data: 66 year old male respiratory failure, intubated.  PORTABLE CHEST - 1 VIEW  Comparison: 12/28/2011.  Findings: AP portable semi upright view 2112 hours.  Endotracheal tube tip projects over the tracheal air column between level of clavicles and carina, about 5 cm proximal to the carina.  Lower cervical anterior and posterior fusion hardware partially visible with overlying skin staples.  The patient is rotated to the right. Lower lung volumes.  Grossly stable mediastinal contours.  No pneumothorax, pulmonary edema, pleural effusion or consolidation identified.  Gas in the gastric fundus.  IMPRESSION: 1.  Endotracheal tube in good position, tip between level of clavicles and carina. 2.  Rotated.  Lower lung volumes. 3.  Interval postoperative changes to the cervical spine.  Original Report Authenticated By: Harley Hallmark, M.D.    Assessment/Plan: Diagnosis: Cervical myelopathy status post C2-C7 laminectomy and fusion 1. Does the need for close, 24 hr/day medical supervision in concert with the patient's rehab needs make it unreasonable for this patient to be served in a less intensive setting? Yes 2. Co-Morbidities requiring supervision/potential complications: Hypertension, diabetes,  postoperative hematoma 3. Due to bladder management, bowel management, safety, skin/wound care, disease management, medication administration, pain management and patient education, does the patient require 24 hr/day rehab nursing? Yes 4. Does the patient require coordinated care of a physician, rehab nurse, PT (1-2 hrs/day, 5 days/week) and OT (1-2 hrs/day, 5 days/week) to address physical and functional deficits in the context of the above medical diagnosis(es)? Yes Addressing deficits in the following areas: balance, endurance, locomotion, strength, transferring, bowel/bladder control, bathing, dressing, feeding, grooming and toileting 5. Can the patient actively participate in an intensive therapy program of at least 3 hrs of therapy per day at least 5 days per week? Yes 6. The potential for patient to make measurable gains while on inpatient rehab is excellent 7. Anticipated functional outcomes upon discharge from inpatient rehab are modified independent with PT, modified independent to minimal assistance with OT, . 8. Estimated rehab length of stay to reach the above functional goals is: 2 weeks 9. Does the patient have adequate social supports to accommodate these discharge functional goals? Yes and Potentially 10. Anticipated D/C setting: Home 11. Anticipated post D/C treatments: HH therapy 12. Overall Rehab/Functional Prognosis: excellent  RECOMMENDATIONS: This patient's condition is appropriate for continued rehabilitative care in the following setting: CIR Patient has agreed to participate in recommended program. Yes Note that insurance prior authorization may be required for reimbursement for recommended care.  Comment: Rehabilitation nurse to followup on social supports.   Ranelle Oyster M.D. 01/11/2012

## 2012-01-11 NOTE — Progress Notes (Signed)
Doing well. C/o appropriate incisional soreness.   No Nausea /vomiting Up OOB yest., swallowing well  Temp:  [97.4 F (36.3 C)-99.2 F (37.3 C)] 97.6 F (36.4 C) (06/25 0736) Pulse Rate:  [52-90] 61  (06/25 0700) Resp:  [11-20] 13  (06/25 0700) BP: (100-149)/(57-84) 123/61 mmHg (06/25 0700) SpO2:  [89 %-100 %] 98 % (06/25 0700) FiO2 (%):  [36 %-40 %] 36 % (06/24 0914) Weight:  [78.7 kg (173 lb 8 oz)] 78.7 kg (173 lb 8 oz) (06/25 0600) Neuro  - stable Incision CDI - drain  - output decreasing  Plan: Transfer to floor - cont therapies, d/c drain in am - consult rehab

## 2012-01-11 NOTE — Consult Note (Signed)
Name: [name] MRN: [id number] DOB: 1946-01-15    LOS: 7  PULMONARY / CRITICAL CARE MEDICINE PROGRESS NOTE  HPI:  66 year old male patient of Dr. Phoebe Perch with PMH relevant for HTN, DM and dyslipidemia. Admitted on 6/18 for elective posterior C2-T1 laminectomy and fusion for cervical stenosis with myelopathy. Over the last few hours the patient developed progressive weakness and this prompted to order an MRI of the neck. The study showed a large fluid collection in the surgical plane compressing the spinal cord. He was taken emergently to the OR for surgical drainage and decompression. The patient underwent surgery without apparent complications and was transferred to the neurosurgical ICU intubated. At the time of my exam the patient is intubated, awake, alert, able to follow commands. Critical care consult called for Mechanical ventilation management.   Vital Signs: Temp:  [97.4 F (36.3 C)-98.1 F (36.7 C)] 97.6 F (36.4 C) (06/25 0736) Pulse Rate:  [52-77] 53  (06/25 0900) Resp:  [11-19] 14  (06/25 0900) BP: (100-160)/(56-84) 102/56 mmHg (06/25 0900) SpO2:  [89 %-100 %] 100 % (06/25 0900) Weight:  [78.7 kg (173 lb 8 oz)] 78.7 kg (173 lb 8 oz) (06/25 0600)  Physical Examination: General: no acute distress Neuro:  Awake, following commands. Left hemiparesis - improved. HEENT:  PERRLA, pink conjunctivae, moist membranes Neck:  Rigid collar in place  Cardiovascular:  RRR, no M/R/G Lungs:  Coarse breath sounds bilaterally. No wheezing or crackles. Abdomen:  Soft, nontender, nondistended, bowel sounds present Musculoskeletal:  No edema, cyanosis or clubbing. Skin:  No rash  BMET    Component Value Date/Time   NA 130* 01/09/2012 2112   K 5.1 01/09/2012 2112   CL 92* 01/09/2012 2112   CO2 28 01/09/2012 2112   GLUCOSE 226* 01/09/2012 2112   BUN 17 01/09/2012 2112   CREATININE 0.60 01/09/2012 2112   CALCIUM 9.5 01/09/2012 2112   GFRNONAA >90 01/09/2012 2112   GFRAA >90 01/09/2012 2112     CBC    Component Value Date/Time   WBC 11.5* 01/09/2012 2112   RBC 3.75* 01/09/2012 2112   HGB 11.3* 01/09/2012 2112   HCT 32.9* 01/09/2012 2112   PLT 344 01/09/2012 2112   MCV 87.7 01/09/2012 2112   MCH 30.1 01/09/2012 2112   MCHC 34.3 01/09/2012 2112   RDW 13.4 01/09/2012 2112   LYMPHSABS 0.4* 01/09/2012 2112   MONOABS 0.2 01/09/2012 2112   EOSABS 0.0 01/09/2012 2112   BASOSABS 0.0 01/09/2012 2112    ASSESSMENT AND PLAN: 1) VDRF after repeat C-spine surgery for post operative hematoma - Extubated and looks good. - Routine post extubation care.  2) DM2 - Change to regular insulin sliding scale.  3) Hyperkalemia: will recheck BMET again in AM.  4) Diet, PT/OT and rehab consult, titrate O2 down and OOB to chair as tolerated all ordered and patient has done well.  PCCM will sign off, please call back if needed.  Alyson Reedy, M.D. [facility] Pulmonary/Critical Care Medicine. Pager: [phone]. After hours pager: [phone].

## 2012-01-11 NOTE — Progress Notes (Signed)
I met with patient, son, and nephew at bedside. Discussed inpt rehab venue and they are in agreement to admission rather than SNF rehab. Bed is available tomorrow and I can admit. I will follow up in the morning. Please call me with any questions. [phone].

## 2012-01-11 NOTE — Progress Notes (Signed)
Clinical Social Worker continuing to follow for disposition planning. Clinical Social Worker noted PT/OT re-evaluation and recommendation for inpatient rehab. Clinical Social Worker discussed with pt at bedside and clarified pt questions in regard to inpatient rehab. Clinical Social Worker contacted pt son to update in regard inpatient rehab evaluation. Discussed that pt chosen bed at [facility] can still be available as a secondary option for discharge plan. Patient transferred from unit 3100 to unit 3000 today. Clinical Social Worker provided report to unit 3000 Clinical Social Worker, Dede Query who will continue to follow and assist with pt discharge needs as appropriate.  Jacklynn Lewis, MSW, LCSWA  Clinical Social Work [phone]

## 2012-01-12 ENCOUNTER — Inpatient Hospital Stay (HOSPITAL_COMMUNITY)
Admission: RE | Admit: 2012-01-12 | Discharge: 2012-01-28 | DRG: 945 | Disposition: A | Discharge status: Home Health | Payer: Medicare Other | Source: Ambulatory Visit | Attending: Physical Medicine & Rehabilitation | Admitting: Physical Medicine & Rehabilitation

## 2012-01-12 ENCOUNTER — Encounter (HOSPITAL_COMMUNITY): Payer: Self-pay | Admitting: *Deleted

## 2012-01-12 DIAGNOSIS — K592 Neurogenic bowel, not elsewhere classified: Secondary | ICD-10-CM | POA: Diagnosis present

## 2012-01-12 DIAGNOSIS — Z981 Arthrodesis status: Secondary | ICD-10-CM

## 2012-01-12 DIAGNOSIS — Z5189 Encounter for other specified aftercare: Secondary | ICD-10-CM

## 2012-01-12 DIAGNOSIS — M1710 Unilateral primary osteoarthritis, unspecified knee: Secondary | ICD-10-CM | POA: Diagnosis present

## 2012-01-12 DIAGNOSIS — Y9210 Unspecified residential institution as the place of occurrence of the external cause: Secondary | ICD-10-CM | POA: Diagnosis present

## 2012-01-12 DIAGNOSIS — G959 Disease of spinal cord, unspecified: Secondary | ICD-10-CM

## 2012-01-12 DIAGNOSIS — M4712 Other spondylosis with myelopathy, cervical region: Secondary | ICD-10-CM

## 2012-01-12 DIAGNOSIS — I10 Essential (primary) hypertension: Secondary | ICD-10-CM | POA: Diagnosis present

## 2012-01-12 DIAGNOSIS — IMO0002 Reserved for concepts with insufficient information to code with codable children: Secondary | ICD-10-CM | POA: Diagnosis present

## 2012-01-12 DIAGNOSIS — E785 Hyperlipidemia, unspecified: Secondary | ICD-10-CM | POA: Diagnosis present

## 2012-01-12 DIAGNOSIS — E119 Type 2 diabetes mellitus without complications: Secondary | ICD-10-CM | POA: Diagnosis present

## 2012-01-12 DIAGNOSIS — Y832 Surgical operation with anastomosis, bypass or graft as the cause of abnormal reaction of the patient, or of later complication, without mention of misadventure at the time of the procedure: Secondary | ICD-10-CM | POA: Diagnosis present

## 2012-01-12 DIAGNOSIS — N319 Neuromuscular dysfunction of bladder, unspecified: Secondary | ICD-10-CM

## 2012-01-12 LAB — GLUCOSE, CAPILLARY
Glucose-Capillary: 207 mg/dL — ABNORMAL HIGH (ref 70–99)
Glucose-Capillary: 271 mg/dL — ABNORMAL HIGH (ref 70–99)
Glucose-Capillary: 289 mg/dL — ABNORMAL HIGH (ref 70–99)

## 2012-01-12 MED ORDER — SORBITOL 70 % SOLN
30.0000 mL | Freq: Every day | Status: DC | PRN
  Administered 2012-01-12 – 2012-01-17 (×2): 30 mL via ORAL
  Filled 2012-01-12 (×3): qty 30

## 2012-01-12 MED ORDER — DEXAMETHASONE 2 MG PO TABS
2.0000 mg | ORAL_TABLET | Freq: Three times a day (TID) | ORAL | Status: DC
  Administered 2012-01-12 – 2012-01-17 (×14): 2 mg via ORAL
  Filled 2012-01-12 (×18): qty 1

## 2012-01-12 MED ORDER — ONDANSETRON HCL 4 MG/2ML IJ SOLN: 4.0000 mg | Freq: Four times a day (QID) | INTRAMUSCULAR | Status: DC | PRN

## 2012-01-12 MED ORDER — INSULIN ASPART 100 UNIT/ML ~~LOC~~ SOLN
0.0000 [IU] | SUBCUTANEOUS | Status: DC
  Administered 2012-01-12: 11 [IU] via SUBCUTANEOUS
  Administered 2012-01-12: 8 [IU] via SUBCUTANEOUS
  Administered 2012-01-13 (×3): 3 [IU] via SUBCUTANEOUS
  Administered 2012-01-13: 5 [IU] via SUBCUTANEOUS
  Administered 2012-01-13: 3 [IU] via SUBCUTANEOUS
  Administered 2012-01-13: 5 [IU] via SUBCUTANEOUS
  Administered 2012-01-13 – 2012-01-14 (×2): 3 [IU] via SUBCUTANEOUS
  Administered 2012-01-14: 15 [IU] via SUBCUTANEOUS
  Administered 2012-01-14: 3 [IU] via SUBCUTANEOUS
  Administered 2012-01-14: 5 [IU] via SUBCUTANEOUS
  Administered 2012-01-14: 2 [IU] via SUBCUTANEOUS
  Administered 2012-01-15: 5 [IU] via SUBCUTANEOUS
  Administered 2012-01-15: 4 [IU] via SUBCUTANEOUS
  Administered 2012-01-15: 5 [IU] via SUBCUTANEOUS
  Administered 2012-01-15: 2 [IU] via SUBCUTANEOUS
  Administered 2012-01-15 (×2): 3 [IU] via SUBCUTANEOUS
  Administered 2012-01-16 (×2): 5 [IU] via SUBCUTANEOUS
  Administered 2012-01-16 (×3): 3 [IU] via SUBCUTANEOUS
  Administered 2012-01-16: 5 [IU] via SUBCUTANEOUS
  Administered 2012-01-17: 3 [IU] via SUBCUTANEOUS
  Administered 2012-01-17 (×3): 5 [IU] via SUBCUTANEOUS
  Administered 2012-01-17: 8 [IU] via SUBCUTANEOUS
  Administered 2012-01-17 – 2012-01-18 (×2): 3 [IU] via SUBCUTANEOUS
  Administered 2012-01-18: 2 [IU] via SUBCUTANEOUS
  Administered 2012-01-18: 3 [IU] via SUBCUTANEOUS
  Administered 2012-01-18: 5 [IU] via SUBCUTANEOUS
  Administered 2012-01-18: 3 [IU] via SUBCUTANEOUS
  Administered 2012-01-19: 5 [IU] via SUBCUTANEOUS
  Administered 2012-01-19: 2 [IU] via SUBCUTANEOUS
  Administered 2012-01-19: 8 [IU] via SUBCUTANEOUS
  Administered 2012-01-19 – 2012-01-20 (×2): 3 [IU] via SUBCUTANEOUS
  Administered 2012-01-20: 2 [IU] via SUBCUTANEOUS
  Administered 2012-01-20 – 2012-01-21 (×4): 3 [IU] via SUBCUTANEOUS
  Administered 2012-01-21: 5 [IU] via SUBCUTANEOUS
  Administered 2012-01-22 (×2): 2 [IU] via SUBCUTANEOUS

## 2012-01-12 MED ORDER — OXYCODONE-ACETAMINOPHEN 5-325 MG PO TABS
2.0000 | ORAL_TABLET | ORAL | Status: DC | PRN
  Administered 2012-01-12 – 2012-01-28 (×54): 2 via ORAL
  Filled 2012-01-12 (×54): qty 2

## 2012-01-12 MED ORDER — BIOTENE DRY MOUTH MT LIQD
15.0000 mL | Freq: Four times a day (QID) | OROMUCOSAL | Status: DC
  Administered 2012-01-12 – 2012-01-27 (×37): 15 mL via OROMUCOSAL

## 2012-01-12 MED ORDER — POLYETHYLENE GLYCOL 3350 17 G PO PACK
17.0000 g | PACK | Freq: Every day | ORAL | Status: DC | PRN
  Administered 2012-01-17: 17 g via ORAL
  Filled 2012-01-12: qty 1

## 2012-01-12 MED ORDER — ACETAMINOPHEN 325 MG PO TABS: 325.0000 mg | ORAL_TABLET | ORAL | Status: DC | PRN

## 2012-01-12 MED ORDER — METHOCARBAMOL 500 MG PO TABS
500.0000 mg | ORAL_TABLET | Freq: Four times a day (QID) | ORAL | Status: DC | PRN
  Administered 2012-01-17 – 2012-01-28 (×17): 500 mg via ORAL
  Filled 2012-01-12 (×17): qty 1

## 2012-01-12 MED ORDER — GLIPIZIDE 5 MG PO TABS
5.0000 mg | ORAL_TABLET | Freq: Two times a day (BID) | ORAL | Status: DC
  Administered 2012-01-12 – 2012-01-15 (×7): 5 mg via ORAL
  Filled 2012-01-12 (×11): qty 1

## 2012-01-12 MED ORDER — SIMVASTATIN 20 MG PO TABS
20.0000 mg | ORAL_TABLET | Freq: Every day | ORAL | Status: DC
  Administered 2012-01-12 – 2012-01-27 (×16): 20 mg via ORAL
  Filled 2012-01-12 (×17): qty 1

## 2012-01-12 MED ORDER — ONDANSETRON HCL 4 MG PO TABS: 4.0000 mg | ORAL_TABLET | Freq: Four times a day (QID) | ORAL | Status: DC | PRN

## 2012-01-12 NOTE — H&P (Signed)
Physical Medicine and Rehabilitation Admission H&P  No chief complaint on file.  :  HPI: [name] is a 66 y.o. right-handed male admitted 01/04/2012 with weakness upper extremities that has steadily worsened. X-rays and imaging revealed cervical stenosis with myelopathy. Underwent posterior cervical laminectomy C2-T1 with posterior fusion C2-T1 01/04/2012 per Dr. Phoebe Perch. Postoperatively patient developed progressive weakness that prompted an MRI of the neck that showed a large fluid collection in the surgical plane compressing the spinal cord. Underwent emergent exploration of posterior cervical wound and evacuation of wound an epidural hematoma 01/09/2012 per Dr. Newell Coral. Patient has been fitted with cervical brace. Postoperative pain management.. Remains on Decadron protocol. Physical and occupational therapy evaluations completed with recommendations for physical medicine rehabilitation consult to consider inpatient rehabilitation services. Patient was felt to be a candidate for inpatient rehabilitation services and was admitted for comprehensive rehabilitation program  Review of Systems  HENT: Positive for neck pain.  Neurological: Positive for weakness.  All other systems reviewed and are negative  Past Medical History   Diagnosis  Date   .  Neuromuscular disorder    .  Hypertension    .  High cholesterol    .  Type II diabetes mellitus    .  Arthritis      "right knee; left hip"    Past Surgical History   Procedure  Date   .  Anterior cervical decomp/discectomy fusion  2008   .  Posterior fusion cervical spine  01/04/12   .  Joint replacement      Left Hip Replacement   .  Total hip arthroplasty  1999     left   .  Excisional hemorrhoidectomy  1970's   .  Inguinal hernia repair  ~ 1975     left   .  Posterior cervical laminectomy  01/04/2012     Procedure: POSTERIOR CERVICAL LAMINECTOMY; Surgeon: Clydene Fake, MD; Location: [facility]; Service: Neurosurgery; Laterality:  N/A; Cervical Two to Thoracic One2 Decompressive Laminectomy, Fusion Cervical Two to Thoracic One with Instrumentation    Family History   Problem  Relation  Age of Onset   .  Anesthesia problems  Neg Hx     Social History: reports that he has been smoking Cigarettes. He has a 35 pack-year smoking history. He has quit using smokeless tobacco. He reports that he drinks alcohol. He reports that he does not use illicit drugs.  Allergies: No Known Allergies  Medications Prior to Admission   Medication  Sig  Dispense  Refill   .  diclofenac (VOLTAREN) 75 MG EC tablet  Take 75 mg by mouth 2 (two) times daily.     Marland Kitchen  glipiZIDE (GLUCOTROL) 5 MG tablet  Take 5 mg by mouth 2 (two) times daily before a meal.     .  lisinopril (PRINIVIL,ZESTRIL) 10 MG tablet  Take 10 mg by mouth daily.     .  metFORMIN (GLUCOPHAGE) 1000 MG tablet  Take 1,000 mg by mouth 2 (two) times daily with a meal.     .  pravastatin (PRAVACHOL) 40 MG tablet  Take 40 mg by mouth daily.      Home:  Home Living  Lives With: Alone  Available Help at Discharge: Family;Available 24 hours/day  Type of Home: (I living apartment)  Home Access: Elevator  Home Layout: One level  Bathroom Shower/Tub: Sports administrator: Standard  Bathroom Accessibility: Yes  How Accessible: Accessible via walker  Home Adaptive Equipment:  Straight cane  Functional History:  Prior Function  Able to Take Stairs?: Yes  Driving: Yes  Vocation: Unemployed  Comments: retired Investment banker, operational for a [employer]  Functional Status:  Mobility:  Bed Mobility  Bed Mobility: Supine to Sit;Sitting - Scoot to [place] of Bed  Rolling Left: 4: Min guard  Left Sidelying to Sit: 3: Mod assist  Supine to Sit: 3: Mod assist;With rails  Sitting - Scoot to Edge of Bed: 4: Min assist;4: Min guard  Transfers  Transfers: Sit to Stand;Stand to Sit  Sit to Stand: 1: +2 Total assist;With upper extremity assist;From bed;From elevated surface  Sit to Stand:  Patient Percentage: 50%  Stand to Sit: 1: +2 Total assist;With upper extremity assist;With armrests;To chair/3-in-1  Stand to Sit: Patient Percentage: 50%  Ambulation/Gait  Ambulation/Gait Assistance: 1: +2 Total assist  Ambulation/Gait: Patient Percentage: 50%  Ambulation Distance (Feet): 8 Feet  Assistive device: 2 person hand held assist  Ambulation/Gait Assistance Details: Patient with hyperextension of L knee. A for weightshifting and to advance LE and maintain balance.  Gait Pattern: Shuffle;Narrow base of support  Gait velocity: decreased   ADL:  ADL  Eating/Feeding: Simulated;Set up  Where Assessed - Eating/Feeding: Chair  Grooming: Simulated;Wash/dry hands;Wash/dry face;Teeth care;Brushing hair;Minimal assistance  Where Assessed - Grooming: Supported sitting  Upper Body Bathing: Simulated;Moderate assistance  Where Assessed - Upper Body Bathing: Supported sitting  Lower Body Bathing: Simulated;Maximal assistance  Where Assessed - Lower Body Bathing: Supported sit to stand  Upper Body Dressing: Simulated;Maximal assistance  Where Assessed - Upper Body Dressing: Supported sitting  Lower Body Dressing: Simulated;+1 Total assistance  Where Assessed - Lower Body Dressing: Sopported sit to stand  Toilet Transfer: Simulated;+2 Total assistance  Toilet Transfer Method: Sit to Production manager: Bedside commode  Equipment Used: Gait belt;Rolling walker (aspen collar)  Transfers/Ambulation Related to ADLs: Pt. moved sit to stand with total A +2 (pt. ~40%) and pivoted to recliner with total A +2 (pt. ~50%)  ADL Comments: Pt. requires min A for EOB sitting. Is very motivated  Cognition:  Cognition  Arousal/Alertness: Awake/alert  Orientation Level: Oriented X4  Cognition  Overall Cognitive Status: Appears within functional limits for tasks assessed/performed  Area of Impairment: Safety/judgement;Awareness of errors;Problem solving  Arousal/Alertness: Awake/alert    Orientation Level: Appears intact for tasks assessed  Behavior During Session: [facility] for tasks performed  Safety/Judgement: Decreased safety judgement for tasks assessed  Cognition - Other Comments: slow processing, someones slurring his words a bit, difficulty sequencing movement especially with weak LUE, very anxious, almost self limiting in a way    Blood pressure 107/67, pulse 62, temperature 97.7 F (36.5 C), temperature source Oral, resp. rate 20, height 6' (1.829 m), weight 78.7 kg (173 lb 8 oz), SpO2 98.00%.  Physical Exam  Vitals reviewed.  Constitutional: He is oriented to person, place, and time. He appears well-developed.  HENT:  Head: Normocephalic and atraumatic. Oromucosa pink and moist. Dentition fair.  Right Ear: External ear normal.  Left Ear: External ear normal.  Nose: Nose normal.  Mouth/Throat: Oropharynx is clear and moist.  Eyes: Conjunctivae and EOM are normal. Pupils are equal, round, and reactive to light.  Cardiovascular: Normal rate and regular rhythm. No murmur heard.  Pulmonary/Chest: Effort normal and breath sounds normal. No respiratory distress.  Abdominal: Soft. Bowel sounds are normal. He exhibits no distension. There is no tenderness.  Musculoskeletal: He exhibits no edema.  Neurological: He is alert and oriented to person, place, and time.  Subtle sensory changes in the left upper extremity. Left upper extremity grossly 2+ to 3 deltoid, 2+ biceps, 2+ to 3 triceps, 0 wrist extension, 1/5 finger flexion, 0/5 hand intrinsics. on manual muscle testing. Left lower extremity grossly 2-3/5. Right upper extremity 4/5 as with right lower extremity today. Reflexes were 1+ throughout. Cognitively he is intact.  Skin:  Surgical site clean and dry with cervical collar intact  Psychiatric: He has a normal mood and affect. His behavior is normal. Judgment and thought content normal  Results for orders placed during the hospital encounter of 01/04/12 (from the past 48  hour(s))   GLUCOSE, CAPILLARY Status: Abnormal    Collection Time    01/10/12 7:57 AM   Component  Value  Range  Comment    Glucose-Capillary  229 (*)  70 - 99 mg/dL    GLUCOSE, CAPILLARY Status: Abnormal    Collection Time    01/10/12 11:46 AM   Component  Value  Range  Comment    Glucose-Capillary  223 (*)  70 - 99 mg/dL    GLUCOSE, CAPILLARY Status: Abnormal    Collection Time    01/10/12 3:53 PM   Component  Value  Range  Comment    Glucose-Capillary  233 (*)  70 - 99 mg/dL    GLUCOSE, CAPILLARY Status: Abnormal    Collection Time    01/10/12 8:12 PM   Component  Value  Range  Comment    Glucose-Capillary  328 (*)  70 - 99 mg/dL    GLUCOSE, CAPILLARY Status: Abnormal    Collection Time    01/10/12 11:42 PM   Component  Value  Range  Comment    Glucose-Capillary  199 (*)  70 - 99 mg/dL    GLUCOSE, CAPILLARY Status: Abnormal    Collection Time    01/11/12 4:10 AM   Component  Value  Range  Comment    Glucose-Capillary  162 (*)  70 - 99 mg/dL    GLUCOSE, CAPILLARY Status: Abnormal    Collection Time    01/11/12 7:42 AM   Component  Value  Range  Comment    Glucose-Capillary  176 (*)  70 - 99 mg/dL    GLUCOSE, CAPILLARY Status: Abnormal    Collection Time    01/11/12 11:38 AM   Component  Value  Range  Comment    Glucose-Capillary  318 (*)  70 - 99 mg/dL    GLUCOSE, CAPILLARY Status: Abnormal    Collection Time    01/11/12 4:15 PM   Component  Value  Range  Comment    Glucose-Capillary  278 (*)  70 - 99 mg/dL    GLUCOSE, CAPILLARY Status: Abnormal    Collection Time    01/11/12 8:11 PM   Component  Value  Range  Comment    Glucose-Capillary  289 (*)  70 - 99 mg/dL     Comment 1  Documented in Chart      Comment 2  Notify RN     GLUCOSE, CAPILLARY Status: Abnormal    Collection Time    01/12/12 12:16 AM   Component  Value  Range  Comment    Glucose-Capillary  207 (*)  70 - 99 mg/dL     Comment 1  Documented in Chart      Comment 2  Notify RN      No results found.    Post Admission Physician Evaluation:  1. Functional deficits secondary to cervical stenosis with myelopathy status  post C2-T1 fusion. Patient with postoperative complication of hematoma requiring reexploration.. 2. Patient is admitted to receive collaborative, interdisciplinary care between the physiatrist, rehab nursing staff, and therapy team. 3. Patient's level of medical complexity and substantial therapy needs in context of that medical necessity cannot be provided at a lesser intensity of care such as a SNF. 4. Patient has experienced substantial functional loss from his/her baseline which was documented above under the "Functional History" and "Functional Status" headings. Judging by the patient's diagnosis, physical exam, and functional history, the patient has potential for functional progress which will result in measurable gains while on inpatient rehab. These gains will be of substantial and practical use upon discharge in facilitating mobility and self-care at the household level. 5. Physiatrist will provide 24 hour management of medical needs as well as oversight of the therapy plan/treatment and provide guidance as appropriate regarding the interaction of the two. 6. 24 hour rehab nursing will assist with bladder management, bowel management, safety, skin/wound care, disease management, medication administration, pain management and patient education and help integrate therapy concepts, techniques,education, etc. 7. PT will assess and treat for: Functional mobility, neuromuscular reeducation, pain management, safety, adaptive equipment, balance. Goals are: Modified independent to supervision. 8. OT will assess and treat for: Upper extremity strength, range of motion, safety, neuromuscular reeducation, ADLs, functional mobility. Goals are: Modified independent to minimal assistance. 9. SLP will assess and treat for: Not applicable 10. Case Management and Social Worker will assess and treat  for psychological issues and discharge planning. 11. Team conference will be held weekly to assess progress toward goals and to determine barriers to discharge. 12. Patient will receive at least 3 hours of therapy per day at least 5 days per week. 13. ELOS: 2 -3 weeks Prognosis: excellent Medical Problem List and Plan:  1. Cervical myelopathy. Status post C2-C7 laminectomy and fusion 01/04/2012 with postoperative epidural hematoma evacuation 01/09/2012  2. DVT Prophylaxis/Anticoagulation: SCDs. Monitor for any signs of DVT  3.. Pain Management: Decadron taper as advised, Robaxin and Percocet as needed. Monitor with increased mobility  4. Neuropsych: This patient is capable of making decisions on his/her own behalf.  5. Hypertension. Presently on no antihypertensive medication. Patient on lisinopril 10 mg daily prior to admission. Will monitor with increased mobility  6. Diabetes mellitus. Blood sugars mid 200s and monitored while on Decadron. Will resume Glucotrol 5 mg twice a day as prior to admission. Patient also on Glucophage 1000 mg twice a day prior to admission. Check blood sugars a.c. and at bedtime resume oral agents as directed  7. Hyperlipidemia. Zocor 8. Neurogenic bowel: augment bowel regimen. Ranelle Oyster M.D. 01/12/2012

## 2012-01-12 NOTE — Discharge Summary (Signed)
Physician Discharge Summary  Patient ID: [name] MRN: [id number] DOB/AGE: April 18, 1946 66 y.o.  Admit date: 01/04/2012 Discharge date: 01/12/2012  Admission Diagnoses:Cervical stenosis with myelopathy   Discharge Diagnoses: Cervical stenosis with myelopathy  Active Problems:  Respiratory failure, post-operative  Hyperkalemia  Hypoxemia  Weakness   Discharged Condition: fair  Hospital Course: pt admitted day of surgery - surgery listed below - post -op  Worked with therapies - pt thought he was a litle improved - but then started getting weaker - MRI showed hematoma at surgical bed - pt underwent surgery to evacuate hematoma - post op - pt slowly impriving - rehab consulted and pt will be d/c to inpt rehab  Consults: rehabilitation medicine  Significant Diagnostic Studies: radiology: MRI: c-spine  Treatments: surgery: 1.POSTERIOR CERVICAL LAMINECTOMY C2-T1,inclusive (6 levels) , posterior fusion, C2-T1 (6 levels), segmented screw instumentation, autograft  2. POSTERIOR CERVICAL LAMINECTOMY: Exploration of posterior cervical wound and evacuation of wound and epidural hematoma      Discharge Exam: Blood pressure 107/67, pulse 62, temperature 97.7 F (36.5 C), temperature source Oral, resp. rate 20, height 6' (1.829 m), weight 82.6 kg (182 lb 1.6 oz), SpO2 98.00%. weakness in bilat UE Left worse than right distal worse than prox - grip/wrist on Left is 3-/5 instrength , gait - sl spastic , incision - C/D/I   Disposition: inpt rehab   Medication List  As of 01/12/2012  7:57 AM   STOP taking these medications         diclofenac 75 MG EC tablet         ASK your doctor about these medications         glipiZIDE 5 MG tablet   Commonly known as: GLUCOTROL   Take 5 mg by mouth 2 (two) times daily before a meal.      lisinopril 10 MG tablet   Commonly known as: PRINIVIL,ZESTRIL   Take 10 mg by mouth daily.      metFORMIN 1000 MG tablet   Commonly known as: GLUCOPHAGE     Take 1,000 mg by mouth 2 (two) times daily with a meal.      pravastatin 40 MG tablet   Commonly known as: PRAVACHOL   Take 40 mg by mouth daily.             SignedClydene Fake, MD 01/12/2012, 7:57 AM

## 2012-01-12 NOTE — Progress Notes (Signed)
Report given to Rehab unit RN Milly Jakob, son at bedside

## 2012-01-12 NOTE — Progress Notes (Addendum)
Results for [name], [name] (MRN [id number]) as of 01/12/2012 13:15  Ref. Range 01/10/2012 23:42 01/11/2012 04:10 01/11/2012 07:42 01/11/2012 11:38 01/11/2012 16:15 01/11/2012 20:11  Glucose-Capillary Latest Range: 70-99 mg/dL 409 (H) 811 (H) 914 (H) 318 (H) 278 (H) 289 (H)    Results for [name], [name] (MRN [id number]) as of 01/12/2012 13:15  Ref. Range 01/12/2012 00:16 01/12/2012 07:49 01/12/2012 12:15  Glucose-Capillary Latest Range: 70-99 mg/dL 086 (H) 578 (H) 469 (H)    Recommend the following: 1. Change Novolog SSI to tid ac + HS (currently ordered Q4 hours and patient eating PO diet) 2. Add meal coverage- Novolog 3 units tid with meals (while patient on PO steroids)  Will follow. Ambrose Finland RN, MSN, CDE Diabetes Coordinator Inpatient Diabetes Program [phone]

## 2012-01-12 NOTE — Progress Notes (Signed)
Patient transferred from 3000, admitted to inpatient rehab at 1500 with son at bedside. Report received from Andrea,RN.  Provided orientation packet and oriented patient and family  to rehab.  Patient viewed safety video.  Call bell within reach.

## 2012-01-12 NOTE — Plan of Care (Addendum)
Overall Plan of Care [facility]) Patient Details Name: [name] MRN: [id number] DOB: [birth year]  Diagnosis:  Cervical stenosis with myelopathy  Primary Diagnosis:    Myelopathy Co-morbidities: neurogenic bowel, bladder, pain.  Functional Problem List  Patient demonstrates impairments in the following areas: Balance, Bladder, Bowel, Endurance, Medication Management, Motor, Pain, Safety, Sensory  and Skin Integrity  Basic ADL's: eating, grooming, bathing, dressing and toileting Advanced ADL's: simple meal preparation and light housekeeping  Transfers:  bed mobility, bed to chair, toilet, tub/shower, car and furniture Locomotion:  ambulation, wheelchair mobility and stairs  Additional Impairments:  Leisure Awareness  Anticipated Outcomes Item Anticipated Outcome  Eating/Swallowing  Mod I for self feeding  Basic self-care  Supervision -> min assist  Tolieting  Supervision   Bowel/Bladder  Supervision with bowel and bladder.  Transfers  Supervision  Locomotion  Mod I w/c; Supervision to Min A gait  Communication    Cognition    Pain   2 or less on scale 0-10  Safety/Judgment    Other     Therapy Plan:   OT Frequency: 1-2 X/day, 60-90 minutes PT Frequency: 1-2x/day, 60-90 minutes     Team Interventions: Item RN PT OT SLP SW TR Other  Self Care/Advanced ADL Retraining  x x      Neuromuscular Re-Education  x x      Therapeutic Activities  x x   x   UE/LE Strength Training/ROM  x x   x   UE/LE Coordination Activities  x x   x   Visual/Perceptual Remediation/Compensation         DME/Adaptive Equipment Instruction  x x   x   Therapeutic Exercise  x x   x   Balance/Vestibular Training  x x   x   Patient/Family Education x x x   x   Cognitive Remediation/Compensation         Functional Mobility Training  x x   x   Ambulation/Gait Training  x x      Stair Training  x       Wheelchair Propulsion/Positioning  x x      Functional Electrical Stimulation  x         Community Reintegration  x x   x   Dysphagia/Aspiration Film/video editor         Bladder Management x        Bowel Management x        Disease Management/Prevention  x       Pain Management x x x      Medication Management x        Skin Care/Wound Management x x x      Splinting/Orthotics  x x      Discharge Planning  x x   x   Psychosocial Support  x x   x                      Team Discharge Planning: Destination:  Home Projected Follow-up:  PT, OT and Home Health Projected Equipment Needs:  Bedside Commode, Tub Bench and Wheelchair, other AD TBD Patient/family involved in discharge planning:  Yes  MD ELOS: 2-3 weeks Medical Rehab Prognosis:  Excellent Assessment: Pt admitted for CIR therapies.  The team will be focusing on NMR, strength, balance, adaptive equipment, bowel and bladder control, pain, safety, and functional mobility, with mod I to supervision goals at a wheelchair  level/ambulatory level.

## 2012-01-12 NOTE — Progress Notes (Signed)
Patient to be transferred to inpt rehab today. Please call [phone] with questions.

## 2012-01-12 NOTE — Progress Notes (Signed)
Physical Therapy Treatment Patient Details Name: [name] MRN: [id number] DOB: 07/25/1945 Today's Date: 01/12/2012 Time: 2956-2130 PT Time Calculation (min): 26 min  PT Assessment / Plan / Recommendation Comments on Treatment Session  Pt was able to increase ambulation distance and standing balance time.  Pt required less assistance and able to tolerate increased mobility without increased fatigue.  Patient remains highly motivated for therapy.      Follow Up Recommendations  Inpatient Rehab    Barriers to Discharge        Equipment Recommendations  Defer to next venue    Recommendations for Other Services Rehab consult  Frequency Min 5X/week   Plan Discharge plan remains appropriate    Precautions / Restrictions Precautions Precautions: Fall Required Braces or Orthoses: Cervical Brace Cervical Brace: Hard collar Restrictions Weight Bearing Restrictions: No   Pertinent Vitals/Pain 2/10 cervical pain    Mobility  Bed Mobility Rolling Left: 4: Min guard Left Sidelying to Sit: 3: Mod assist Sitting - Scoot to Edge of Bed: 4: Min guard Details for Bed Mobility Assistance: A required by patient to use PTA's hand to pull up, and to obtain upright position/control for trunk.  Pt required VC for safe technique. Transfers Sit to Stand: 1: +2 Total assist;With upper extremity assist;From bed;From chair/3-in-1 Sit to Stand: Patient Percentage: 60% Stand to Sit: 3: Mod assist;With upper extremity assist;To chair/3-in-1;To bed Details for Transfer Assistance: Cues needed to scoot forward from sitting before initiating stand, and cueing for proper placement of UE and LE.  Cues and A needed to control descent into recliner.   Ambulation/Gait Ambulation/Gait Assistance: 1: +2 Total assist Ambulation/Gait: Patient Percentage: 60% Ambulation Distance (Feet): 16 Feet Assistive device: 2 person hand held assist Ambulation/Gait Assistance Details: Pt required VC and tactile cues to  weight shift and to advance LE.  Pt needed A to maintain balance.  Pt required additional time due to pain in R knee and to advance LE.  Pt had one seated 5 min rest break. Gait Pattern: Step-to pattern;Shuffle Gait velocity: decreased Stairs: No    Exercises     PT Diagnosis:    PT Problem List:   PT Treatment Interventions:     PT Goals Acute Rehab PT Goals PT Goal: Supine/Side to Sit - Progress: Progressing toward goal PT Goal: Sit to Stand - Progress: Progressing toward goal PT Goal: Stand to Sit - Progress: Progressing toward goal PT Goal: Stand - Progress: Progressing toward goal PT Goal: Ambulate - Progress: Progressing toward goal  Visit Information  Last PT Received On: 01/12/12 Assistance Needed: +2    Subjective Data      Cognition       Balance  Static Sitting Balance Static Sitting - Balance Support: Feet supported Static Sitting - Level of Assistance: 4: Min assist Static Sitting - Comment/# of Minutes: 5 Static Standing Balance Static Standing - Balance Support: Bilateral upper extremity supported Static Standing - Level of Assistance: 1: +2 Total assist Static Standing - Comment/# of Minutes: Pt req VC to shift weight anteriorly over BOS and maintain upright posture.  Pt. improved in finding standing balance as session progressed.  Mod A needed for periodic LOB / 5 min.  End of Session PT - End of Session Equipment Utilized During Treatment: Gait belt;Cervical collar Activity Tolerance: Patient tolerated treatment well Patient left: in chair;with call bell/phone within reach;with family/visitor present Nurse Communication: Mobility status   GP     [name] 01/12/2012, 11:50 AM

## 2012-01-12 NOTE — Progress Notes (Signed)
Attempted to call report to rehab unit, rehab RN to call when room ready

## 2012-01-12 NOTE — Progress Notes (Signed)
Seen and agreed 01/12/2012 Anson Peddie Elizabeth PTA [phone] pager [phone] office    

## 2012-01-12 NOTE — Clinical Social Work Note (Addendum)
Pt is ready for discharge today to CIR. Pt and pt's son are agreeable to discharge plan. CSW contacted [facility] to update facility regarding discharge plan. CSW is signing off as no further needs identified.   Dede Query, MSW, [facility] [phone]  Addendum: Pt was discussed at Long Length of Stay Meeting.  Dede Query, MSW, [facility]

## 2012-01-13 ENCOUNTER — Encounter (HOSPITAL_COMMUNITY): Payer: Self-pay | Admitting: Neurosurgery

## 2012-01-13 DIAGNOSIS — N319 Neuromuscular dysfunction of bladder, unspecified: Secondary | ICD-10-CM

## 2012-01-13 DIAGNOSIS — Z5189 Encounter for other specified aftercare: Secondary | ICD-10-CM

## 2012-01-13 DIAGNOSIS — M4712 Other spondylosis with myelopathy, cervical region: Secondary | ICD-10-CM

## 2012-01-13 DIAGNOSIS — K592 Neurogenic bowel, not elsewhere classified: Secondary | ICD-10-CM

## 2012-01-13 LAB — COMPREHENSIVE METABOLIC PANEL
Albumin: 2.6 g/dL — ABNORMAL LOW (ref 3.5–5.2)
BUN: 20 mg/dL (ref 6–23)
Chloride: 99 mEq/L (ref 96–112)
Creatinine, Ser: 0.62 mg/dL (ref 0.50–1.35)
GFR calc Af Amer: >90 mL/min (ref >90)
GFR calc non Af Amer: >90 mL/min (ref >90)
Glucose, Bld: 157 mg/dL — ABNORMAL HIGH (ref 70–99)
Total Bilirubin: 0.2 mg/dL — ABNORMAL LOW (ref 0.3–1.2)

## 2012-01-13 LAB — DIFFERENTIAL
Basophils Absolute: 0 10*3/uL (ref 0.0–0.1)
Basophils Relative: 0 % (ref 0–1)
Neutro Abs: 7.4 10*3/uL (ref 1.7–7.7)
Neutrophils Relative %: 71 % (ref 43–77)

## 2012-01-13 LAB — GLUCOSE, CAPILLARY
Glucose-Capillary: 165 mg/dL — ABNORMAL HIGH (ref 70–99)
Glucose-Capillary: 171 mg/dL — ABNORMAL HIGH (ref 70–99)
Glucose-Capillary: 183 mg/dL — ABNORMAL HIGH (ref 70–99)
Glucose-Capillary: 208 mg/dL — ABNORMAL HIGH (ref 70–99)

## 2012-01-13 LAB — CBC
HCT: 34.8 % — ABNORMAL LOW (ref 39.0–52.0)
MCV: 88.5 fL (ref 78.0–100.0)
RDW: 13.3 % (ref 11.5–15.5)
WBC: 10.3 10*3/uL (ref 4.0–10.5)

## 2012-01-13 MED ORDER — BISACODYL 10 MG RE SUPP
10.0000 mg | Freq: Every day | RECTAL | Status: DC | PRN
  Administered 2012-01-13: 10 mg via RECTAL
  Filled 2012-01-13: qty 1

## 2012-01-13 NOTE — Progress Notes (Signed)
Physical Therapy Session Note  Patient Details  Name: [name] MRN: [id number] Date of Birth: 09/10/1945  Today's Date: 01/13/2012 Time: 1300-1346 Time Calculation (min): 46 min  Short Term Goals: Week 1:  PT Short Term Goal 1 (Week 1): Pt will gait x 20' with Mod A PT Short Term Goal 2 (Week 1): Pt will maintain dynamic standing balance with Min A PT Short Term Goal 3 (Week 1): Pt will transfer bed<>chair with Mod A  Skilled Therapeutic Interventions/Progress Updates:    Sit <> stands currently max assist. Pt with significant posterior pelvic tilt in sitting which inhibits independence with sit <> stand, facilitated rocking into anterior pelvic tilt and performed stretch with sheet. Tried ambulation with trial of RW with Lt. Walker splint vs quad cane. Pt prefers RW with splint and gait much safer (mod assist with RW, max assist with cane). Pt has Lt. Knee hyperextension that is poorly controlled. Noted limited Lt. Ankle dorsiflexion, performed short stretch however pt will benefit from further stretching or adding stretching into individual HEP.   Therapy Documentation Precautions:  Precautions Precautions: Fall Precaution Comments: Educated pt on cervical precautions. Required Braces or Orthoses: Cervical Brace Cervical Brace: Hard collar Restrictions Weight Bearing Restrictions: No Other Position/Activity Restrictions: patient has personal knee brace he likes to wear for comfort Vital Signs: Therapy Vitals Temp: 99.1 F (37.3 C) Temp src: Oral Pulse Rate: 98  BP: 124/83 mmHg Patient Position, if appropriate: Sitting Oxygen Therapy SpO2: 97 % O2 Device: None (Room air) Pain: Pain Assessment Pain Score:   7 Pain Type: Surgical pain Pain Location: Neck Pain Descriptors: Aching Pain Frequency: Intermittent Pain Onset: Gradual Locomotion : Ambulation Ambulation/Gait Assistance: 2: Max assist   See FIM for current functional status  Therapy/Group: Individual  Therapy  Wilhemina Bonito 01/13/2012, 6:22 PM

## 2012-01-13 NOTE — Plan of Care (Signed)
Problem: RH PAIN MANAGEMENT Goal: RH STG PAIN MANAGED AT OR BELOW PT'S PAIN GOAL 2 or less on scale 0-10  Outcome: Progressing Medication effective. Infrequent request for prn pain medication

## 2012-01-13 NOTE — Progress Notes (Signed)
Patient information reviewed and entered into [facility] system by Janeisha Ryle, RN, CRRN, PPS Coordinator.  Information including medical coding and functional independence measure will be reviewed and updated through discharge.     Per nursing patient was given "Data Collection Information Summary for Patients in Inpatient Rehabilitation Facilities with attached "Privacy Act Statement-Health Care Records" upon admission.   

## 2012-01-13 NOTE — Evaluation (Signed)
Physical Therapy Assessment and Plan  Patient Details  Name: [name] MRN: [id number] Date of Birth: 12/16/1945  PT Diagnosis: Abnormal posture, Abnormality of gait, Difficulty walking, Impaired sensation, Muscle weakness (L>R, UE>LE) and Pain in posterior cervical spine Rehab Potential: Good ELOS: 2-3 weeks   Today's Date: 01/13/2012 Time: 8:45-9:45  Time Calculation (min): 60 min  Problem List:  Patient Active Problem List  Diagnosis  . Respiratory failure, post-operative  . Hyperkalemia  . Hypoxemia  . Weakness  . Myelopathy    Past Medical History:  Past Medical History  Diagnosis Date  . Neuromuscular disorder   . Hypertension   . High cholesterol   . Type II diabetes mellitus   . Arthritis     "right knee; left hip"   Past Surgical History:  Past Surgical History  Procedure Date  . Anterior cervical decomp/discectomy fusion 2008  . Posterior fusion cervical spine 01/04/12  . Joint replacement     Left Hip Replacement  . Total hip arthroplasty 1999    left  . Excisional hemorrhoidectomy 1970's  . Inguinal hernia repair ~ 1975    left  . Posterior cervical laminectomy 01/04/2012    Procedure: POSTERIOR CERVICAL LAMINECTOMY;  Surgeon: Clydene Fake, MD;  Location: [facility];  Service: Neurosurgery;  Laterality: N/A;  Cervical Two to Thoracic One2  Decompressive Laminectomy, Fusion Cervical Two to Thoracic One with Instrumentation  . Posterior cervical laminectomy 01/09/2012    Procedure: POSTERIOR CERVICAL LAMINECTOMY;  Surgeon: Hewitt Shorts, MD;  Location: [facility];  Service: Neurosurgery;  Laterality: N/A;  Evacuation of Epidural Hematoma    Assessment & Plan Clinical Impression: Patient is a 66 y.o. year old male with recent admission to the hospital on 01/04/2012 with weakness upper extremities that has steadily worsened. X-rays and imaging revealed cervical stenosis with myelopathy. Underwent posterior cervical laminectomy C2-T1 with posterior  fusion C2-T1 01/04/2012 per Dr. Phoebe Perch. Postoperatively patient developed progressive weakness that prompted an MRI of the neck that showed a large fluid collection in the surgical plane compressing the spinal cord. Underwent emergent exploration of posterior cervical wound and evacuation of wound an epidural hematoma 01/09/2012 per Dr. Newell Coral. Patient has been fitted with cervical hard collar brace. Postoperative pain management. Remains on Decadron protocol. Patient transferred to CIR on 01/12/2012 .   Patient currently requires mod assist (max with gait) with mobility secondary to muscle weakness, decreased cardiorespiratoy endurance, impaired timing and sequencing and unbalanced muscle activation and decreased standing balance, decreased postural control and decreased balance strategies.  Prior to hospitalization, patient was independent with mobility and lived with Alone in a Independent living facility (apartment) home.  Pt then used an elevator to access his single-level home.  Patient will benefit from skilled PT intervention to maximize safe functional mobility, minimize fall risk and decrease caregiver burden for planned discharge home with 24 hour assist.  Anticipate patient will benefit from follow up [facility] at discharge.  PT - End of Session Activity Tolerance: Decreased this session Endurance Deficit: Yes PT Assessment Rehab Potential: Good Barriers to Discharge: Decreased caregiver support PT Plan PT Frequency: 1-2 X/day, 60-90 minutes Estimated Length of Stay: 2-3 weeks PT Treatment/Interventions: Ambulation/gait training;Balance/vestibular training;Community reintegration;Discharge planning;Disease management/prevention;DME/adaptive equipment instruction;Functional mobility training;Neuromuscular re-education;Pain management;Patient/family education;Psychosocial support;Skin care/wound management;Splinting/orthotics;Therapeutic Activities;Stair training;Therapeutic Exercise;UE/LE  Strength taining/ROM;UE/LE Coordination activities;Wheelchair propulsion/positioning PT Recommendation Follow Up Recommendations: Home health PT Equipment Recommended: Wheelchair (measurements) (AD for gait TBD)  PT Evaluation Precautions/Restrictions Precautions Precautions: Fall Precaution Comments: Educated pt on  cervical precautions. Required Braces or Orthoses: Cervical Brace Cervical Brace: Hard collar Restrictions Weight Bearing Restrictions: No Other Position/Activity Restrictions: patient has personal knee brace he likes to wear for comfort General @[id number] [phone]) Vital Signs   Pain Pain Assessment Pain Assessment: 0-10 Pain Score:   6 Pain Type: Surgical pain Pain Location: Neck Pain Descriptors: Aching Pain Onset: Gradual Pain Intervention(s): RN made aware Home Living/Prior Functioning Home Living Lives With: Alone Available Help at Discharge: Family;Available 24 hours/day (son, daughter, aunts) Type of Home: Independent living facility (apartment) Home Access: Elevator Home Layout: One level Bathroom Shower/Tub: Tub/shower unit;Curtain Firefighter: Standard Bathroom Accessibility: Yes How Accessible: Accessible via walker Home Adaptive Equipment: Straight cane Prior Function Level of Independence: Independent with basic ADLs;Independent with homemaking with ambulation;Independent with gait;Independent with transfers;Requires assistive device for independence Able to Take Stairs?: Yes (reports he could take 2 before feeling unsafe) Driving: Yes Vocation: Retired Gaffer: Retail buyer Comments: seems to participate in activities set up at independent living center Vision/Perception  Vision - History Baseline Vision: Wears glasses all the time Patient Visual Report: No change from baseline Vision - Assessment Eye Alignment: Within Functional Limits Perception Perception: Within Functional Limits Praxis Praxis: Intact    Cognition Overall Cognitive Status: Appears within functional limits for tasks assessed Arousal/Alertness: Awake/alert Orientation Level: Oriented X4 Memory: Appears intact Awareness: Appears intact Problem Solving: Appears intact Safety/Judgment: Appears intact Sensation Sensation Light Touch: Impaired by gross assessment (Diminished LT throughout Bilateral UE (L>R) and LLE) Proprioception: Appears Intact Additional Comments: complaints of numbness in L hand Coordination Gross Motor Movements are Fluid and Coordinated: No Fine Motor Movements are Fluid and Coordinated: No Coordination and Movement Description: decreased gross and fine motor movements of LUE Motor  Motor Motor: Abnormal postural alignment and control (decreased motor strength and sensation L>R)  Mobility   Locomotion  Gait Gait: Yes Gait Pattern: Impaired Gait Pattern: Step-to pattern;Decreased step length - right;Decreased step length - left;Decreased stride length;Decreased hip/knee flexion - right;Decreased hip/knee flexion - left;Decreased dorsiflexion - left;Decreased dorsiflexion - right;Decreased weight shift to right;Right flexed knee in stance;Shuffle;Lateral hip instability;Decreased trunk rotation;Trunk flexed;Narrow base of support Gait velocity: decreased Stairs / Additional Locomotion Stairs: No Wheelchair Mobility Wheelchair Mobility: Yes Wheelchair Propulsion: Both upper extremities (steering with Bilateral LE) Wheelchair Parts Management: Needs assistance Distance: 30'  Trunk/Postural Assessment  Cervical Assessment Cervical Assessment: Exceptions to [facility] (posterior incision; assessment limited d/t hard collar) Thoracic Assessment Thoracic Assessment: Within Functional Limits Lumbar Assessment Lumbar Assessment: Within Functional Limits Postural Control Postural Control: Deficits on evaluation  Balance Balance Balance Assessed: Yes Static Sitting Balance Static Sitting - Balance  Support: Feet supported;Right upper extremity supported Static Sitting - Level of Assistance: 5: Stand by assistance Dynamic Sitting Balance Dynamic Sitting - Level of Assistance: 4: Min Oncologist Standing - Balance Support: Bilateral upper extremity supported Static Standing - Level of Assistance: 4: Min assist Dynamic Standing Balance Dynamic Standing - Level of Assistance: 2: Max assist Extremity Assessment  RUE Assessment RUE Assessment: Exceptions to [facility] RUE AROM (degrees) Overall AROM Right Upper Extremity: Within functional limits for tasks performed RUE Strength RUE Overall Strength: Within Functional Limits for tasks performed Right Shoulder Flexion: 3/5 Right Shoulder ABduction: 3/5 Right Elbow Flexion: 4/5 Right Elbow Extension: 4/5 Right Forearm Pronation: 5/5 Right Forearm Supination: 5/5 Right Wrist Flexion: 5/5 Right Wrist Extension: 5/5 LUE Assessment LUE Assessment: Exceptions to WFL LUE AROM (degrees) Overall AROM Left Upper Extremity: Deficits LUE Strength LUE Overall Strength: Deficits  Left Shoulder Flexion: 2+/5 Left Shoulder ABduction: 2/5 Left Elbow Flexion: 2-/5 Left Elbow Extension: 2-/5 Left Forearm Pronation: 2-/5 Left Forearm Supination: 2-/5 Gross Grasp: Impaired RLE Assessment RLE Assessment: Within Functional Limits RLE Strength RLE Overall Strength Comments: Hip flex/abd/add 4/5; knee flex/ext and PF/DF 4-/5 LLE Assessment LLE Assessment: Exceptions to [facility] LLE Strength LLE Overall Strength Comments: Hip flex/abd/add 4-/5; knee ext 4/5; PF/DF and knee flex 3+/5  Skilled Therapy Session #1: Time: 8:45-9:45 (60 min)  Individual therapy session.  Pt  Reports 7/10 pain along posterior cervical incision-received pain medication at beginning of therapy. Tx focused on bed mobility, transfers wc<mat>, stand pivot transfers with +2 Assist, and gait with with Max A (HHA with 2 helpers).  Pt self-propelled w/c for UE  muscular strengthening, endurance, and mobility.  Pt demonstrates ability to compensate for LUE weakness by self-propelling w/c using bilateral UE (hooking L thumb on rim spokes to propel with LUE) and using bilateral LE to steer.  Educated pt on w/c and cushion selection as well as proper transfer techniques and rehab process.  Pt left sitting in w/c with call bell in reach.   See FIM for current functional status Refer to Care Plan for Long Term Goals  Recommendations for other services: None  Discharge Criteria: Patient will be discharged from PT if patient refuses treatment 3 consecutive times without medical reason, if treatment goals not met, if there is a change in medical status, if patient makes no progress towards goals or if patient is discharged from hospital.  The above assessment, treatment plan, treatment alternatives and goals were discussed and mutually agreed upon: by patient  Deirdre Pippins 01/13/2012, 10:07 AM

## 2012-01-13 NOTE — Evaluation (Signed)
Reviewed and in agreement with treatment provided.  

## 2012-01-13 NOTE — Progress Notes (Signed)
Last bowel movement 01/13/12 x 2. Large, soft, bowel movement after suppository given. Extra large bowel movement after soap sud enema.

## 2012-01-13 NOTE — Progress Notes (Signed)
Off-going nurse and patient stated patient last bowel movement was 6/18. Patient was given sorbitol, and stated wanted to see if that would work before taking anything else. Patient states is passing flatus. Received a verbal order from Dan A.,PA at 0545 am for suppository to be given this am. Will continue to monitor patient.

## 2012-01-13 NOTE — Evaluation (Signed)
Occupational Therapy Assessment and Plan & Session Note  Patient Details  Name: [name] MRN: [id number] Date of Birth: [birth year]  OT Diagnosis: acute pain, hemiplegia affecting non-dominant side and muscle weakness (generalized) Rehab Potential: Rehab Potential: Good ELOS: 2-3 weeks   Today's Date: 01/13/2012  ASSESSMENT AND PLAN  Problem List:  Patient Active Problem List  Diagnosis  . Respiratory failure, post-operative  . Hyperkalemia  . Hypoxemia  . Weakness  . Myelopathy    Past Medical History:  Past Medical History  Diagnosis Date  . Neuromuscular disorder   . Hypertension   . High cholesterol   . Type II diabetes mellitus   . Arthritis     "right knee; left hip"   Past Surgical History:  Past Surgical History  Procedure Date  . Anterior cervical decomp/discectomy fusion 2008  . Posterior fusion cervical spine 01/04/12  . Joint replacement     Left Hip Replacement  . Total hip arthroplasty 1999    left  . Excisional hemorrhoidectomy 1970's  . Inguinal hernia repair ~ 1975    left  . Posterior cervical laminectomy 01/04/2012    Procedure: POSTERIOR CERVICAL LAMINECTOMY;  Surgeon: Clydene Fake, MD;  Location: [facility];  Service: Neurosurgery;  Laterality: N/A;  Cervical Two to Thoracic One2  Decompressive Laminectomy, Fusion Cervical Two to Thoracic One with Instrumentation  . Posterior cervical laminectomy 01/09/2012    Procedure: POSTERIOR CERVICAL LAMINECTOMY;  Surgeon: Hewitt Shorts, MD;  Location: [facility];  Service: Neurosurgery;  Laterality: N/A;  Evacuation of Epidural Hematoma    Assessment & Plan Clinical Impression: [name] is a 66 y.o. right-handed male admitted 01/04/2012 with weakness upper extremities that has steadily worsened. X-rays and imaging revealed cervical stenosis with myelopathy. Underwent posterior cervical laminectomy C2-T1 with posterior fusion C2-T1 01/04/2012 per Dr. Phoebe Perch. Postoperatively patient  developed progressive weakness that prompted an MRI of the neck that showed a large fluid collection in the surgical plane compressing the spinal cord. Underwent emergent exploration of posterior cervical wound and evacuation of wound an epidural hematoma 01/09/2012 per Dr. Newell Coral. Patient has been fitted with cervical brace. Postoperative pain management.. Remains on Decadron protocol.  Patient transferred to CIR on 01/12/2012 .    Patient currently requires total with basic self-care skills secondary to muscle weakness, muscle joint tightness and muscle paralysis and decreased sitting balance, decreased standing balance, decreased postural control, hemiplegia and decreased balance strategies.  Prior to hospitalization, patient could complete ADLs & IADLs at a mod I level, using straight cane for functional mobility.   Patient will benefit from skilled intervention to increase independence with basic self-care skills prior to discharge home with help from family members (per patient report).  Anticipate patient will require 24 hour supervision and follow up home health.  OT - End of Session Activity Tolerance: Tolerates 10 - 20 min activity with multiple rests Endurance Deficit: Yes OT Assessment Rehab Potential: Good Barriers to Discharge: Decreased caregiver support Barriers to Discharge Comments: patient currently lives alone and states his daughter, son, and maybe some other family members will assist at D/C OT Plan OT Frequency: 1-2 X/day, 60-90 minutes Estimated Length of Stay: 2-3 weeks OT Treatment/Interventions: Balance/vestibular training;Community reintegration;Discharge planning;DME/adaptive equipment instruction;Functional mobility training;Neuromuscular re-education;Patient/family education;Psychosocial support;Self Care/advanced ADL retraining;Pain management;Skin care/wound managment;Splinting/orthotics;Therapeutic Activities;Therapeutic Exercise;UE/LE Strength taining/ROM;UE/LE  Coordination activities;Wheelchair propulsion/positioning OT Recommendation Follow Up Recommendations: Home health OT Equipment Recommended: Wheelchair (measurements) (AD for gait TBD)  Precautions/Restrictions  Precautions Precautions: Fall Precaution Comments:  Educated pt on cervical precautions. Required Braces or Orthoses: Cervical Brace Cervical Brace: Hard collar Restrictions Weight Bearing Restrictions: No Other Position/Activity Restrictions: patient has personal knee brace he likes to wear for comfort  General Chart Reviewed: Yes Family/Caregiver Present: No  Pain Pain Assessment Pain Assessment: 0-10 Pain Score:   6 Pain Type: Surgical pain Pain Location: Neck Pain Descriptors: Aching Pain Onset: Gradual Pain Intervention(s): RN made aware  Home Living/Prior Functioning Home Living Lives With: Alone Available Help at Discharge: Family;Available 24 hours/day (son, daughter, aunts) Type of Home: Independent living facility (apartment) Home Access: Elevator Home Layout: One level Bathroom Shower/Tub: Tub/shower unit;Curtain Firefighter: Standard Bathroom Accessibility: Yes How Accessible: Accessible via walker Home Adaptive Equipment: Straight cane IADL History Homemaking Responsibilities: Yes Meal Prep Responsibility: Primary Laundry Responsibility: Primary Cleaning Responsibility: Primary Current License: Yes Occupation: Retired Type of Occupation: Chef Prior Function Level of Independence: Independent with basic ADLs;Independent with homemaking with ambulation;Independent with gait;Independent with transfers;Requires assistive device for independence Able to Take Stairs?: Yes (reports he could take 2 before feeling unsafe) Driving: Yes Vocation: Retired Gaffer: Retail buyer Comments: seems to participate in activities set up at independent living center  ADL - See FIM  Vision/Perception  Vision - History Baseline Vision:  Wears glasses all the time Patient Visual Report: No change from baseline Vision - Assessment Eye Alignment: Within Functional Limits Perception Perception: Within Functional Limits Praxis Praxis: Intact   Cognition Overall Cognitive Status: Appears within functional limits for tasks assessed Arousal/Alertness: Awake/alert Orientation Level: Oriented X4 Memory: Appears intact Awareness: Appears intact Problem Solving: Appears intact Safety/Judgment: Appears intact  Sensation Sensation Light Touch: Impaired by gross assessment (Diminished LT throughout Bilateral UE (L>R) and LLE) Proprioception: Appears Intact Additional Comments: complaints of numbness in L hand Coordination Gross Motor Movements are Fluid and Coordinated: No Fine Motor Movements are Fluid and Coordinated: No Coordination and Movement Description: decreased gross and fine motor movements of LUE  Motor  Motor Motor: Abnormal postural alignment and control (decreased motor strength and sensation L>R)  Trunk/Postural Assessment  Cervical Assessment Cervical Assessment: Exceptions to [facility] (posterior incision; assessment limited d/t hard collar) Thoracic Assessment Thoracic Assessment: Within Functional Limits Lumbar Assessment Lumbar Assessment: Within Functional Limits Postural Control Postural Control: Deficits on evaluation   Balance Balance Balance Assessed: Yes Static Sitting Balance Static Sitting - Balance Support: Feet supported;Right upper extremity supported Static Sitting - Level of Assistance: 5: Stand by assistance Dynamic Sitting Balance Dynamic Sitting - Level of Assistance: 4: Min Oncologist Standing - Balance Support: Bilateral upper extremity supported Static Standing - Level of Assistance: 4: Min assist Dynamic Standing Balance Dynamic Standing - Level of Assistance: 2: Max assist  Extremity/Trunk Assessment RUE Assessment RUE Assessment: Exceptions to  [facility] RUE AROM (degrees) Overall AROM Right Upper Extremity: Within functional limits for tasks performed RUE Strength RUE Overall Strength: Within Functional Limits for tasks performed Right Shoulder Flexion: 3/5 Right Shoulder ABduction: 3/5 Right Elbow Flexion: 4/5 Right Elbow Extension: 4/5 Right Forearm Pronation: 5/5 Right Forearm Supination: 5/5 Right Wrist Flexion: 5/5 Right Wrist Extension: 5/5 LUE Assessment LUE Assessment: Exceptions to WFL LUE AROM (degrees) Overall AROM Left Upper Extremity: Deficits LUE Strength LUE Overall Strength: Deficits Left Shoulder Flexion: 2+/5 Left Shoulder ABduction: 2/5 Left Elbow Flexion: 2-/5 Left Elbow Extension: 2-/5 Left Forearm Pronation: 2-/5 Left Forearm Supination: 2-/5 Gross Grasp: Impaired Wrist flexion & extension: 0/5  See FIM for current functional status  Refer to Care Plan for Long  Term Goals  Recommendations for other services: None  Discharge Criteria: Patient will be discharged from OT if patient refuses treatment 3 consecutive times without medical reason, if treatment goals not met, if there is a change in medical status, if patient makes no progress towards goals or if patient is discharged from hospital.  The above assessment, treatment plan, treatment alternatives and goals were discussed and mutually agreed upon: by patient  SESSION NOTE  0730-0830 - 60 Minutes Individual Therapy Patient with 6/10 complaints of pain in neck; RN aware Initial 1:1 occupational therapy evaluation completed. Focused skilled intervention on bed mobility, static sitting balance/tolerance, edge of bed -> BSC transfer with total assist X2 using rolling walker, toileting, UB/LB dressing, and overall activity tolerance/endurance. Patient left seated on BSC beside bed per request with breakfast tray, call bell, & phone within reach.   [name] 01/13/2012, 10:29 AM

## 2012-01-13 NOTE — Plan of Care (Signed)
Problem: RH SAFETY Goal: RH STG ADHERE TO SAFETY PRECAUTIONS W/ASSISTANCE/DEVICE STG Adhere to Safety Precautions With Supervision Outcome: Progressing No unsafe behavior

## 2012-01-13 NOTE — Progress Notes (Signed)
Occupational Therapy Session Note  Patient Details  Name: [name] MRN: [id number] Date of Birth: 1945-09-14  Today's Date: 01/13/2012 Time: 1130-1200 Time Calculation (min): 30 min Pt denies pain  Short Term Goals: Week 1:  OT Short Term Goal 1 (Week 1): Patient will perform UB dressing with min assist OT Short Term Goal 2 (Week 1): Patient will perform LB dressing with moderate assistance OT Short Term Goal 3 (Week 1): A HEP will be implemented with patient and education will be started regarding the program OT Short Term Goal 4 (Week 1): Patient will perform toilet transfers with min assist using DME prn  Skilled Therapeutic Interventions/Progress Updates:  Pt engaged in standing activities with focus on standing balance and RUE use for functional activities.  Pt exhibits trace wrist flexion and extension in left wrist and unable to functionally use LUE for activities.  Discussed home arrangement and discharge plans.  Discussed OT goals during stay in rehab.  Pt pleased with supervision goals.  Pt states he is still working on arranging 24 hour supervision.  Therapy Documentation Precautions:  Precautions Precautions: Fall Precaution Comments: Educated pt on cervical precautions. Required Braces or Orthoses: Cervical Brace Cervical Brace: Hard collar Restrictions Weight Bearing Restrictions: No Other Position/Activity Restrictions: patient has personal knee brace he likes to wear for comfort  See FIM for current functional status  Therapy/Group: Individual Therapy  Rich Brave 01/13/2012, 12:02 PM

## 2012-01-13 NOTE — Progress Notes (Signed)
Patient ID: [name], male   DOB: September 18, 1945, 66 y.o.   MRN: [id number] Subjective/Complaints: Constipated with no results with suppository. Able to sleep. No change in strength yet. Pain under control Other ros items reviewed and negative.   Objective: Vital Signs: Blood pressure 112/70, pulse 56, temperature 98.7 F (37.1 C), temperature source Oral, resp. rate 18, height 6' (1.829 m), SpO2 97.00%. No results found.  Basename 01/13/12 0635  WBC 10.3  HGB 11.4*  HCT 34.8*  PLT 484*   No results found for this basename: NA:2,K:2,CL:2,CO2:2,GLUCOSE:2,BUN:2,CREATININE:2,CALCIUM:2 in the last 72 hours CBG (last 3)   Basename 01/13/12 0359 01/13/12 0016 01/12/12 2016  GLUCAP 183* 171* 271*    Wt Readings from Last 3 Encounters:  01/12/12 82.6 kg (182 lb 1.6 oz)  01/12/12 82.6 kg (182 lb 1.6 oz)  01/12/12 82.6 kg (182 lb 1.6 oz)    Physical Exam:  General appearance: alert, cooperative, appears stated age and no distress Head: Normocephalic, without obvious abnormality, atraumatic Eyes: conjunctivae/corneas clear. PERRL, EOM's intact. Fundi benign. Ears: normal TM's and external ear canals both ears Nose: Nares normal. Septum midline. Mucosa normal. No drainage or sinus tenderness. Throat: lips, mucosa, and tongue normal; teeth and gums normal Neck: no adenopathy, no carotid bruit, no JVD, supple, symmetrical, trachea midline and thyroid not enlarged, symmetric, no tenderness/mass/nodules Back: symmetric, no curvature. ROM normal. No CVA tenderness. Resp: clear to auscultation bilaterally Cardio: regular rate and rhythm, S1, S2 normal, no murmur, click, rub or gallop GI: soft, non-tender; bowel sounds normal; no masses,  no organomegaly Extremities: extremities normal, atraumatic, no cyanosis or edema Pulses: 2+ and symmetric Skin: Skin color, texture, turgor normal. No rashes or lesions Neurologic: Left UE 1-2+ out of 5.  LLE 2 to 2+/5  Incision/Wound:  Clean and  intact   Assessment/Plan: 1. Functional deficits secondary to cervical stenosis with myelopathy and radiculopathy which require 3+ hours per day of interdisciplinary therapy in a comprehensive inpatient rehab setting. Physiatrist is providing close team supervision and 24 hour management of active medical problems listed below. Physiatrist and rehab team continue to assess barriers to discharge/monitor patient progress toward functional and medical goals. FIM:                   Comprehension Comprehension: 5-Follows basic conversation/direction: With extra time/assistive device  Expression Expression Mode: Verbal Expression: 5-Expresses basic needs/ideas: With extra time/assistive device  Social Interaction Social Interaction: 7-Interacts appropriately with others - No medications needed.  Problem Solving Problem Solving: 7-Solves complex problems: Recognizes & self-corrects  Memory Memory: 7-Complete Independence: No helper  1. Cervical myelopathy. Status post C2-C7 laminectomy and fusion 01/04/2012 with postoperative epidural hematoma evacuation 01/09/2012  2. DVT Prophylaxis/Anticoagulation: SCDs. Monitor for any signs of DVT  3.. Pain Management: Decadron taper as advised, Robaxin and Percocet as needed. Monitor with increased mobility  4. Neuropsych: This patient is capable of making decisions on his/her own behalf.  5. Hypertension. Presently on no antihypertensive medication. Patient on lisinopril 10 mg daily prior to admission. Will monitor with increased mobility  6. Diabetes mellitus. Blood sugars mid 200s and monitored while on Decadron. Will resume Glucotrol 5 mg twice a day as prior to admission. Patient also on Glucophage 1000 mg twice a day prior to admission. Check blood sugars a.c. and at bedtime resume oral agents as directed. Sugars elevated so far.  7. Hyperlipidemia. Zocor  8. Neurogenic bowel: augment bowel regimen. enema  LOS (Days) 1 A FACE TO  FACE EVALUATION  WAS PERFORMED  [name] T 01/13/2012, 7:34 AM

## 2012-01-14 DIAGNOSIS — K592 Neurogenic bowel, not elsewhere classified: Secondary | ICD-10-CM

## 2012-01-14 DIAGNOSIS — Z5189 Encounter for other specified aftercare: Secondary | ICD-10-CM

## 2012-01-14 DIAGNOSIS — M4712 Other spondylosis with myelopathy, cervical region: Secondary | ICD-10-CM

## 2012-01-14 DIAGNOSIS — N319 Neuromuscular dysfunction of bladder, unspecified: Secondary | ICD-10-CM

## 2012-01-14 LAB — GLUCOSE, CAPILLARY
Glucose-Capillary: 201 mg/dL — ABNORMAL HIGH (ref 70–99)
Glucose-Capillary: 357 mg/dL — ABNORMAL HIGH (ref 70–99)

## 2012-01-14 MED ORDER — DICLOFENAC SODIUM 1 % TD GEL
1.0000 "application " | Freq: Three times a day (TID) | TRANSDERMAL | Status: AC
  Administered 2012-01-14 – 2012-01-17 (×8): 1 via TOPICAL
  Filled 2012-01-14: qty 100

## 2012-01-14 NOTE — Progress Notes (Signed)
Inpatient Diabetes Program Recommendations  AACE/ADA: New Consensus Statement on Inpatient Glycemic Control (2009)  Target Ranges:  Prepandial:   less than 140 mg/dL      Peak postprandial:   less than 180 mg/dL (1-2 hours)      Critically ill patients:  140 - 180 mg/dL   Reason for Visit: Hyperglycemia   Results for ARNETT, DUDDY (MRN [id number]) as of 01/14/2012 13:33  Ref. Range 01/13/2012 20:52 01/13/2012 23:47 01/14/2012 03:52 01/14/2012 07:56 01/14/2012 12:04  Glucose-Capillary Latest Range: 70-99 mg/dL 573 (H) 220 (H) 254 (H) 139 (H) 158 (H)    Note: Recommend adding meal coverage insulin - Novolog 3 units tidwc if pt eats > 50% meal.  Will follow.

## 2012-01-14 NOTE — Progress Notes (Signed)
Reviewed and in agreement with treatment provided.  

## 2012-01-14 NOTE — Progress Notes (Addendum)
Per State Regulation 482.30 This chart was reviewed for medical necessity with respect to the patient's Admission/Duration of stay. Meryl Dare                 Nurse Care Manager            Next Review Date: 01/17/12

## 2012-01-14 NOTE — Progress Notes (Addendum)
Inpatient Rehabilitation Center Individual Statement of Services  Patient Name:  [name]  Date:  01/14/2012  Welcome to the Inpatient Rehabilitation Center.  Our goal is to provide you with an individualized program based on your diagnosis and situation, designed to meet your specific needs.  With this comprehensive rehabilitation program, you will be expected to participate in at least 3 hours of rehabilitation therapies Monday-Friday, with modified therapy programming on the weekends.  Your rehabilitation program will include the following services:  Physical Therapy (PT), Occupational Therapy (OT), 24 hour per day rehabilitation nursing, Therapeutic Recreaction (TR), Case Management (RN and Child psychotherapist), Rehabilitation Medicine, Nutrition Services and Pharmacy Services  Weekly team conferences will be held on Tuesdays to discuss your progress.  Your RN Case Designer, television/film set will talk with you frequently to get your input and to update you on team discussions.  Team conferences with you and your family in attendance may also be held.  Expected length of stay: 2-3 weeks  Overall anticipated outcome:Supervision - minimum assistance  Depending on your progress and recovery, your program may change.  Your RN Case Estate agent will coordinate services and will keep you informed of any changes.  Your RN Sports coach and SW names and contact numbers are listed  below.  The following services may also be recommended but are not provided by the Inpatient Rehabilitation Center:   Driving Evaluations  Home Health Rehabiltiation Services  Outpatient Rehabilitatation [facility]  Vocational Rehabilitation   Arrangements will be made to provide these services after discharge if needed.  Arrangements include referral to agencies that provide these services.  Your insurance has been verified to be:  Medicare Your primary doctor is:  Dr Knox Royalty  Pertinent information  will be shared with your doctor and your insurance company.  Case Manager: Lutricia Horsfall, [facility] [phone]  Social Worker:  Dossie Der, [place] [phone]  Information discussed with pt and with son by phone and copy given to patient by: Meryl Dare, 01/14/2012

## 2012-01-14 NOTE — Progress Notes (Signed)
Occupational Therapy Session Note  Patient Details  Name: [name] MRN: [id number] Date of Birth: 1945-07-21  Today's Date: 01/14/2012 Session 1 Time: 0800-0845 Time Calculation (min):  Short Term Goals: Week 1:  OT Short Term Goal 1 (Week 1): Patient will perform UB dressing with min assist OT Short Term Goal 2 (Week 1): Patient will perform LB dressing with moderate assistance OT Short Term Goal 3 (Week 1): A HEP will be implemented with patient and education will be started regarding the program OT Short Term Goal 4 (Week 1): Patient will perform toilet transfers with min assist using DME prn  Skilled Therapeutic Interventions/Progress Updates:    Pt engaged in ADL retraining w/c level at sink including bathing and dressing with sit to stand from w/c.  Pt requires assistance bathing RUE secondary to decreased left wrist and hand function.  Pt requires min verbal cues for UE/LE positioning in preparation for standing and for leaning forward to unweight buttocks with sit<>stand.  AE introduced to assist with LB dressing.  Focus on activity tolerance, sit<>stand, standing balance, safety awareness, and compensatory strategies.  Therapy Documentation Precautions:  Precautions Precautions: Fall Precaution Comments: Educated pt on cervical precautions. Required Braces or Orthoses: Cervical Brace Cervical Brace: Hard collar Restrictions Weight Bearing Restrictions: No Other Position/Activity Restrictions: patient has personal knee brace he likes to wear for comfort   Pain: Pain Assessment Pain Assessment: No/denies pain Pain Score: 0-No pain  See FIM for current functional status  Therapy/Group: Individual Therapy  Session 2 Time: 1130-1200 Pt denies pain Individual Therapy Clinical Specialist participated in neuro reeducation to assess left scapula, left shoulder girdle, and LUE function.  Pt exhibits trace wrist flexion/extension and trace index finger flexion.  Pt  exhibits scapula abduction/adduction.  Pt participated in active weight bearing through LUE with wrist supported, resistive exercises for wrist and fingers.  Discussed with patient plan to continue with strengthening and possible compensatory strategies.  Pt pleased with progress and plan.  [name] [facility] 01/14/2012, 12:10 PM

## 2012-01-14 NOTE — Progress Notes (Signed)
Physical Therapy Session Note  Patient Details  Name: [name] MRN: [id number] Date of Birth: 1945-07-20  Today's Date: 01/14/2012 Time: 8:45-9:45 (60 min)  Short Term Goals: Week 1:  PT Short Term Goal 1 (Week 1): Pt will gait x 20' with Mod A PT Short Term Goal 2 (Week 1): Pt will maintain dynamic standing balance with Min A PT Short Term Goal 3 (Week 1): Pt will transfer bed<>chair with Mod A  Skilled Therapeutic Interventions/Progress Updates:  Pt self-propelled w/c ~140' to gym with 1 rest break for improved endurance, independence with mobility, and UE strengthening.  Pt tends to hook thumb on rim spokes with ulnar deviation and wrist flexion to help propel L wheel.  Discussed possibly investing in gloves for skin protection.  Pt practiced scooting L and R at [facility] with support at L wrist and elbow to encourage WB and increase functional strength of LUE. Tx focused on LE stretching of DF, hamstrings, and hip ER in order to improved ROM.  Pt has limited PROM of DF bilaterally due to tight gastroc/soleus which improved with stretching session.  Introduced general supine LE strengthening exercises to patient as he was interested in continuing to strengthen LEs while in room at night.  Pt perform 1 x 10 reps of ankle circles, heel slides, SAQ, SLR, and hip ABd bilaterally.  Provided handout for HEP during rehab stay at this time.  Gait x 36' in RW with mod A for weight shifting and balance.  Pt tends to abduct and IR L shoulder in order to propel RW forwards due to limited LUE strength and presents with L shoulder depression due to decreased scapular muscle support.  Provided support at pt's elbow during gait and discussed possible treatment options with OT to best facilitate patient and prevent further injury.  Recommend continuing to use L hand splint with RW with facilitation and support at pt's elbow in order to encourage functional return but pt may also benefit from L platform on RW.     Therapy Documentation Precautions:  Precautions Precautions: Fall Precaution Comments: Educated pt on cervical precautions. Required Braces or Orthoses: Cervical Brace Cervical Brace: Hard collar Restrictions Weight Bearing Restrictions: No Other Position/Activity Restrictions: patient has personal knee brace he likes to wear for comfort  Pain: No c/o pain during session- pt premedicated.  Pt reported he has had L shoulder pain over the last few days however. Discussed tx options with pt and reasons for why this may be in relation to decreased muscle support with scapular weakness.  See FIM for current functional status  Therapy/Group: Individual Therapy  Deirdre Pippins 01/14/2012, 12:25 PM

## 2012-01-14 NOTE — Progress Notes (Signed)
Physical Therapy Session Note  Patient Details  Name: [name] MRN: [id number] Date of Birth: [birth year]  Today's Date: 01/14/2012 Time: 0102-7253 Time Calculation (min): 47 min  Short Term Goals: Week 1:  PT Short Term Goal 1 (Week 1): Pt will gait x 20' with Mod A PT Short Term Goal 2 (Week 1): Pt will maintain dynamic standing balance with Min A PT Short Term Goal 3 (Week 1): Pt will transfer bed<>chair with Mod A  Therapy Documentation Precautions:  Precautions Precautions: Fall Precaution Comments: Educated pt on cervical precautions. Required Braces or Orthoses: Cervical Brace Cervical Brace: Hard collar Restrictions Weight Bearing Restrictions: No Other Position/Activity Restrictions: patient has personal knee brace he likes to wear for comfort Pain: Pain Assessment Pain Assessment: No/denies pain Pain Score: 0-No pain Other Treatments: Treatments Therapeutic Activity: Patient with increased gastroc tightness/limited DF ROM; spoke with PA about ordering bilat PRAFO's for night use to maintain ankle DF ROM.  Patient demonstrating w/c mobility with bilat UE but catching thumb in spokes and significant wrist flexion and ulnar deviation; concerned about damage to thumb and wrist advised patient on use of bilat LE and RUE propulsion in controlled environment x 150 with mod A on mild uphill grade secondary to slick shoes.  Secondary to limited DF ROM assessed patient with shoes on for need for heel wedge; patient's dress shoes that he wears most often have a slight heel which compensates for lack of DF in standing; may need heel lift in flat shoes.  Sit <> stand training from elevated mat with focus on active anterior pelvic tilts with full anterior lean to bring COG over BOS for more efficient sit > stand and for eccentric control with stand > sit with mod A secondary to patient mainly pushing up with LLE secondary to significant R knee OA/instability.  See FIM for current  functional status  Therapy/Group: Individual Therapy  Edman Circle [facility] 01/14/2012, 12:21 PM

## 2012-01-15 DIAGNOSIS — N319 Neuromuscular dysfunction of bladder, unspecified: Secondary | ICD-10-CM

## 2012-01-15 DIAGNOSIS — K592 Neurogenic bowel, not elsewhere classified: Secondary | ICD-10-CM

## 2012-01-15 DIAGNOSIS — M4712 Other spondylosis with myelopathy, cervical region: Secondary | ICD-10-CM

## 2012-01-15 DIAGNOSIS — Z5189 Encounter for other specified aftercare: Secondary | ICD-10-CM

## 2012-01-15 LAB — GLUCOSE, CAPILLARY
Glucose-Capillary: 170 mg/dL — ABNORMAL HIGH (ref 70–99)
Glucose-Capillary: 218 mg/dL — ABNORMAL HIGH (ref 70–99)
Glucose-Capillary: 227 mg/dL — ABNORMAL HIGH (ref 70–99)

## 2012-01-15 MED ORDER — SENNOSIDES-DOCUSATE SODIUM 8.6-50 MG PO TABS
2.0000 | ORAL_TABLET | Freq: Every day | ORAL | Status: DC
  Administered 2012-01-15 – 2012-01-23 (×8): 2 via ORAL
  Filled 2012-01-15 (×10): qty 2

## 2012-01-15 NOTE — Progress Notes (Signed)
Physical Therapy Session Note  Patient Details  Name: [name] MRN: [id number] Date of Birth: Jun 24, 1946  Today's Date: 01/15/2012 Time: 1000-1100 Time Calculation (min): 60 min  Short Term Goals: Week 1:  PT Short Term Goal 1 (Week 1): Pt will gait x 20' with Mod A PT Short Term Goal 2 (Week 1): Pt will maintain dynamic standing balance with Min A PT Short Term Goal 3 (Week 1): Pt will transfer bed<>chair with Mod A  Skilled Therapeutic Interventions/Progress Updates:   Patient participated in bilat LE strengthening and endurance/coordination training group with focus on bilat UE and LE strength, coordination and endurance on Nustep at level 6 x 10 minutes with ACE wrap to stabilize LUE and performed seated bilat LE strengthening exercises without back support with verbal cues to maintain anterior pelvic tilt and upright trunk during exercises for core activation: 2-4 sets x 10 reps R and LLE hip flexion, knee extension, knee flexion, resisted clamshells, ADD ball squeezes  Therapy Documentation Precautions:  Precautions Precautions: Fall Precaution Comments: Educated pt on cervical precautions. Required Braces or Orthoses: Cervical Brace Cervical Brace: Hard collar Restrictions Weight Bearing Restrictions: No Other Position/Activity Restrictions: patient has personal knee brace he likes to wear for comfort Pain: Pain Assessment Pain Assessment: No/denies pain Pain Score: 0-No pain  See FIM for current functional status  Therapy/Group: Group Therapy  Edman Circle [place] 01/15/2012, 11:20 AM

## 2012-01-15 NOTE — Progress Notes (Signed)
Occupational Therapy Session Note  Patient Details  Name: ZIERE DOCKEN MRN: [id number] Date of Birth: 1945/11/15  Today's Date: 01/15/2012 Time:1300-1330  Time Calculation: 30 minutes  Skilled Therapeutic Interventions/Progress Updates:  PM session:  Left scapular stability via ROM and ADD/ABD and PNF diagnonals 1 and 2; also motor control L UE triceps and wrist extension/ flexion     Therapy Documentation Precautions:  Precautions Precautions: Fall Precaution Comments: Educated pt on cervical precautions. Required Braces or Orthoses: Cervical Brace Cervical Brace: Hard collar Restrictions Weight Bearing Restrictions: No Other Position/Activity Restrictions: patient has personal knee brace he likes to wear for comfort Pain:denied  See FIM for current functional status  Therapy/Group: Individual Therapy  Bud Face [facility] 01/15/2012, 1:15 PM

## 2012-01-15 NOTE — Progress Notes (Signed)
Orthopedic Tech Progress Note Patient Details:  [name] 08/10/1945 [id number]  Patient ID: [name], male   DOB: [birth year], 66 y.o.   MRN: [id number]   [name] 01/15/2012, 11:07 AM Bilateral prafo boots

## 2012-01-15 NOTE — Progress Notes (Signed)
Occupational Therapy Session Note  Patient Details  Name: [name] MRN: [id number] Date of Birth: 09/27/1945  Today's Date: 01/15/2012 Time: 1000-1100 Time Calculation (min): 60 min   Skilled Therapeutic Interventions/Progress Updates: ADL in w/c at sink with focus on sit to stand.  Patient with moderate difficulty.  Although patient stated he prefers son to assist with b/d of periarea and feet, he also stated his son will not be available upon d/c home.  So, this clinician informed the patient on the importance of completing LB b/d with therapists before going home.     Therapy Documentation Precautions:  Precautions Precautions: Fall Precaution Comments: Educated pt on cervical precautions. Required Braces or Orthoses: Cervical Brace Cervical Brace: Hard collar Restrictions Weight Bearing Restrictions: No Other Position/Activity Restrictions: patient has personal knee brace he likes to wear for comfort Pain: no c/os  See FIM for current functional status  Therapy/Group: Individual Therapy  Bud Face [facility] 01/15/2012, 11:20 AM

## 2012-01-15 NOTE — Progress Notes (Signed)
Patient ID: [name], male   DOB: [birth year], 66 y.o.   MRN: [id number] Subjective/Complaints: Constipation improved after dulc supp and SSE yest. Other ros items reviewed and negative.   Objective: Vital Signs: Blood pressure 112/68, pulse 58, temperature 97.3 F (36.3 C), temperature source Oral, resp. rate 18, height 6' (1.829 m), SpO2 98.00%. No results found.  Basename 01/13/12 0635  WBC 10.3  HGB 11.4*  HCT 34.8*  PLT 484*    Basename 01/13/12 0635  NA 137  K 4.3  CL 99  CO2 27  GLUCOSE 157*  BUN 20  CREATININE 0.62  CALCIUM 9.7   CBG (last 3)   Basename 01/15/12 0413 01/15/12 0115 01/14/12 2019  GLUCAP 218* 170* 357*    Wt Readings from Last 3 Encounters:  01/12/12 82.6 kg (182 lb 1.6 oz)  01/12/12 82.6 kg (182 lb 1.6 oz)  01/12/12 82.6 kg (182 lb 1.6 oz)    Physical Exam:  General appearance: alert, cooperative, appears stated age and no distress Head: Normocephalic, without obvious abnormality, atraumatic Eyes: conjunctivae/corneas clear. PERRL, EOM's intact. Fundi benign. Ears: normal TM's and external ear canals both ears Nose: Nares normal. Septum midline. Mucosa normal. No drainage or sinus tenderness. Throat: lips, mucosa, and tongue normal; teeth and gums normal Neck: no adenopathy, no carotid bruit, no JVD, supple, symmetrical, trachea midline and thyroid not enlarged, symmetric, no tenderness/mass/nodules Back: symmetric, no curvature. ROM normal. No CVA tenderness. Resp: clear to auscultation bilaterally Cardio: regular rate and rhythm, S1, S2 normal, no murmur, click, rub or gallop GI: soft, non-tender; bowel sounds normal; no masses,  no organomegaly Extremities: extremities normal, atraumatic, no cyanosis or edema Pulses: 2+ and symmetric Skin: Skin color, texture, turgor normal. No rashes or lesions Neurologic: Left UE 1-2+ out of 5.  LLE 2 to 2+/5 , RUE 3-/5 Incision/Wound:  Clean and intact   Assessment/Plan: 1. Functional deficits  secondary to cervical stenosis with myelopathy and radiculopathy which require 3+ hours per day of interdisciplinary therapy in a comprehensive inpatient rehab setting. Physiatrist is providing close team supervision and 24 hour management of active medical problems listed below. Physiatrist and rehab team continue to assess barriers to discharge/monitor patient progress toward functional and medical goals. FIM: FIM - Bathing Bathing Steps Patient Completed: Chest;Right Arm;Left Arm;Abdomen;Front perineal area;Buttocks;Right upper leg;Left upper leg Bathing: 4: Min-Patient completes 8-9 44f 10 parts or 75+ percent  FIM - Upper Body Dressing/Undressing Upper body dressing/undressing steps patient completed: Thread/unthread right sleeve of pullover shirt/dresss;Thread/unthread left sleeve of pullover shirt/dress;Pull shirt over trunk Upper body dressing/undressing: 4: Min-Patient completed 75 plus % of tasks FIM - Lower Body Dressing/Undressing Lower body dressing/undressing steps patient completed: Thread/unthread right pants leg;Pull pants up/down Lower body dressing/undressing: 2: Max-Patient completed 25-49% of tasks  FIM - Toileting Toileting: 1: Total-Patient completed zero steps, helper did all 3  FIM - Archivist Transfers: 1-Two helpers  FIM - Banker Devices: HOB elevated;Bed rails;Walker Bed/Chair Transfer: 5: Sit > Supine: Supervision (verbal cues/safety issues);4: Supine > Sit: Min A (steadying Pt. > 75%/lift 1 leg);3: Bed > Chair or W/C: Mod A (lift or lower assist);3: Chair or W/C > Bed: Mod A (lift or lower assist)  FIM - Locomotion: Wheelchair Distance: 30' Locomotion: Wheelchair: 2: Travels 50 - 149 ft with minimal assistance (Pt.>75%) FIM - Locomotion: Ambulation Locomotion: Ambulation Assistive Devices: Walker - Rolling (L hand splint) Ambulation/Gait Assistance: 3: Mod assist Locomotion: Ambulation: 1: Travels less  than 50 ft with  moderate assistance (Pt: 50 - 74%)  Comprehension Comprehension Mode: Auditory Comprehension: 6-Follows complex conversation/direction: With extra time/assistive device  Expression Expression Mode: Verbal Expression: 7-Expresses complex ideas: With no assist  Social Interaction Social Interaction: 6-Interacts appropriately with others with medication or extra time (anti-anxiety, antidepressant).  Problem Solving Problem Solving: 6-Solves complex problems: With extra time  Memory Memory: 6-More than reasonable amt of time  1. Cervical myelopathy. Status post C2-C7 laminectomy and fusion 01/04/2012 with postoperative epidural hematoma evacuation 01/09/2012  2. DVT Prophylaxis/Anticoagulation: SCDs. Monitor for any signs of DVT  3.. Pain Management: Decadron taper as advised, Robaxin and Percocet as needed. Monitor with increased mobility  4. Neuropsych: This patient is capable of making decisions on his/her own behalf.  5. Hypertension. Presently on no antihypertensive medication. Patient on lisinopril 10 mg daily prior to admission. Will monitor with increased mobility  6. Diabetes mellitus. Blood sugars mid 200s and monitored while on Decadron. Will resume Glucotrol 5 mg twice a day as prior to admission. Patient also on Glucophage 1000 mg twice a day prior to admission. Check blood sugars a.c. and at bedtime resume oral agents as directed. Sugars elevated so far.  7. Hyperlipidemia. Zocor  8. Neurogenic bowel: augment bowel regimen senna S qhs. enema  LOS (Days) 3 A FACE TO FACE EVALUATION WAS PERFORMED  [name] 01/15/2012, 7:50 AM

## 2012-01-15 NOTE — Progress Notes (Addendum)
Physical Therapy Note  Patient Details  Name: [name] MRN: [id number] Date of Birth: 01-Jul-1946 Today's Date: 01/15/2012  1330-1425 (55 minutes) individual Pain: no complaint of pain Focus of treatment: Therapeutic exercises for bilateral LE strengthening; gait training with appropriate assistive device; therapeutic activities focused on dynamic standing balance Treatment: Therapeutic exercises in supine- (X 20) bilateral heel slides, hip abduction, SAQS, sit to stand X 5 (pt hyperextends LT knee in stance secondary to decreased RT knee extension in stance ); standing -reaching activity outside BOS min assist with no loss of balance; gait RW with LT hand orthosis 50 feet X 2 min assist; gait 25 feet with SPC min assist.    Mikaelyn Arthurs,JIM 01/15/2012, 2:37 PM

## 2012-01-16 LAB — GLUCOSE, CAPILLARY
Glucose-Capillary: 164 mg/dL — ABNORMAL HIGH (ref 70–99)
Glucose-Capillary: 172 mg/dL — ABNORMAL HIGH (ref 70–99)
Glucose-Capillary: 235 mg/dL — ABNORMAL HIGH (ref 70–99)

## 2012-01-16 MED ORDER — GLIPIZIDE 10 MG PO TABS
10.0000 mg | ORAL_TABLET | Freq: Two times a day (BID) | ORAL | Status: DC
  Administered 2012-01-16 – 2012-01-20 (×9): 10 mg via ORAL
  Filled 2012-01-16 (×11): qty 1

## 2012-01-16 NOTE — Progress Notes (Signed)
Occupational Therapy Note  Patient Details  Name: [name] MRN: [id number] Date of Birth: 1946-01-08 Today's Date: 01/16/2012 Time:  1540-1645  (65 min) Individual Therapy Pain:  None  Performed functional mobility, UE strengthening, neuromuscular re education, therapeutic exercise.  Did BUE exercises on mat with focus on scapular stability and shoulder strength.  Pt's RUE stronger than LUE.  LUE has poor grasp and trace finger flexion; same for wrist.  Pt. Has good PROM in elbow, shoulder and forearm.  HAS 4-/5 shoulder strength.  Pt. Worked very hard and was pleased with performance.     Humberto Seals 01/16/2012, 6:13 PM

## 2012-01-16 NOTE — Progress Notes (Signed)
Patient ID: [name], male   DOB: Dec 13, 1945, 66 y.o.   MRN: [id number] Subjective/Complaints: Constipation improved after dulc supp and SSE yest. Other ros items reviewed and negative.  Still weaker on L side Objective: Vital Signs: Blood pressure 96/60, pulse 56, temperature 98 F (36.7 C), temperature source Oral, resp. rate 16, height 6' (1.829 m), SpO2 97.00%. No results found. No results found for this basename: WBC:2,HGB:2,HCT:2,PLT:2 in the last 72 hours No results found for this basename: NA:2,K:2,CL:2,CO2:2,GLUCOSE:2,BUN:2,CREATININE:2,CALCIUM:2 in the last 72 hours CBG (last 3)   Basename 01/16/12 0356 01/15/12 2358 01/15/12 2025  GLUCAP 200* 235* 227*    Wt Readings from Last 3 Encounters:  01/12/12 82.6 kg (182 lb 1.6 oz)  01/12/12 82.6 kg (182 lb 1.6 oz)  01/12/12 82.6 kg (182 lb 1.6 oz)    Physical Exam:  General appearance: alert, cooperative, appears stated age and no distress Head: Normocephalic, without obvious abnormality, atraumatic Eyes: anicteric, non injected Ears: ext nl Nose: Nares normal. Septum midline. Mucosa normal. No drainage or sinus tenderness. Throat: lips, mucosa, and tongue normal; teeth and gums normal Neck: no adenopathy, no carotid bruit, no JVD, supple, symmetrical, trachea midline and thyroid not enlarged, symmetric, no tenderness/mass/nodules Back: symmetric, no curvature. ROM normal. No CVA tenderness. Resp: clear to auscultation bilaterally Cardio: regular rate and rhythm, S1, S2 normal, no murmur, click, rub or gallop GI: soft, non-tender; bowel sounds normal; no masses,  no organomegaly Extremities: extremities normal, atraumatic, no cyanosis or edema Pulses: 2+ and symmetric Skin: Skin color, texture, turgor normal. No rashes or lesions Neurologic: Left UE 1-2+ out of 5.  LLE 2 to 2+/5 , RUE 4-/5, RLE 4/5 Incision/Wound:  Clean and intact   Assessment/Plan: 1. Functional deficits secondary to cervical stenosis with  myelopathy and radiculopathy which require 3+ hours per day of interdisciplinary therapy in a comprehensive inpatient rehab setting. Physiatrist is providing close team supervision and 24 hour management of active medical problems listed below. Physiatrist and rehab team continue to assess barriers to discharge/monitor patient progress toward functional and medical goals. FIM: FIM - Bathing Bathing Steps Patient Completed: Chest;Left Arm;Abdomen;Right upper leg;Left upper leg (stated his son helped wash periarea & buttocks and feet) Bathing: 3: Mod-Patient completes 5-7 80f 10 parts or 50-74%  FIM - Upper Body Dressing/Undressing Upper body dressing/undressing steps patient completed: Thread/unthread right sleeve of pullover shirt/dresss;Thread/unthread left sleeve of pullover shirt/dress Upper body dressing/undressing: 3: Mod-Patient completed 50-74% of tasks FIM - Lower Body Dressing/Undressing Lower body dressing/undressing steps patient completed: Thread/unthread right pants leg;Pull pants up/down;Don/Doff left shoe;Don/Doff right shoe (wore pants and ted hose and shoes today) Lower body dressing/undressing: 3: Mod-Patient completed 50-74% of tasks  FIM - Toileting Toileting: 0: Activity did not occur  FIM - Archivist Transfers: 0-Activity did not occur  FIM - Banker Devices: HOB elevated;Bed rails;Walker Bed/Chair Transfer: 3: Bed > Chair or W/C: Mod A (lift or lower assist);6: Supine > Sit: No assist  FIM - Locomotion: Wheelchair Distance: 30' Locomotion: Wheelchair: 2: Travels 50 - 149 ft with minimal assistance (Pt.>75%) FIM - Locomotion: Ambulation Locomotion: Ambulation Assistive Devices: Walker - Rolling (Lt hand orthosis) Ambulation/Gait Assistance: 4: Min guard Locomotion: Ambulation: 2: Travels 50 - 149 ft with minimal assistance (Pt.>75%)  Comprehension Comprehension Mode: Auditory Comprehension: 7-Follows complex  conversation/direction: With no assist  Expression Expression Mode: Verbal Expression: 7-Expresses complex ideas: With no assist  Social Interaction Social Interaction: 6-Interacts appropriately with others with medication or extra time (  anti-anxiety, antidepressant).  Problem Solving Problem Solving: 4-Solves basic 75 - 89% of the time/requires cueing 10 - 24% of the time  Memory Memory: 7-Complete Independence: No helper  1. Cervical myelopathy. Status post C2-C7 laminectomy and fusion 01/04/2012 with postoperative epidural hematoma evacuation 01/09/2012  2. DVT Prophylaxis/Anticoagulation: SCDs. Monitor for any signs of DVT  3.. Pain Management: Decadron taper as advised, Robaxin and Percocet as needed. Monitor with increased mobility  4. Neuropsych: This patient is capable of making decisions on his/her own behalf.  5. Hypertension. Presently on no antihypertensive medication. Patient on lisinopril 10 mg daily prior to admission. Will monitor with increased mobility  6. Diabetes mellitus. Blood sugars mid 200s and monitored while on Decadron. Will resume Glucotrol 5 mg twice a day as prior to admission. Patient also on Glucophage 1000 mg twice a day prior to admission. Check blood sugars a.c. and at bedtime resume oral agents as directed. Sugars elevated so far.  7. Hyperlipidemia. Zocor  8. Neurogenic bowel: augment bowel regimen senna S qhs. enema  LOS (Days) 4 A FACE TO FACE EVALUATION WAS PERFORMED  Kaneesha Constantino E 01/16/2012, 7:43 AM

## 2012-01-17 LAB — GLUCOSE, CAPILLARY
Glucose-Capillary: 156 mg/dL — ABNORMAL HIGH (ref 70–99)
Glucose-Capillary: 176 mg/dL — ABNORMAL HIGH (ref 70–99)
Glucose-Capillary: 293 mg/dL — ABNORMAL HIGH (ref 70–99)
Glucose-Capillary: 222 mg/dL — ABNORMAL HIGH (ref 70–99)

## 2012-01-17 MED ORDER — DEXAMETHASONE 2 MG PO TABS
2.0000 mg | ORAL_TABLET | Freq: Two times a day (BID) | ORAL | Status: DC
  Administered 2012-01-17 – 2012-01-18 (×3): 2 mg via ORAL
  Filled 2012-01-17 (×6): qty 1

## 2012-01-17 MED ORDER — METFORMIN HCL 500 MG PO TABS
500.0000 mg | ORAL_TABLET | Freq: Two times a day (BID) | ORAL | Status: DC
  Administered 2012-01-17 – 2012-01-18 (×4): 500 mg via ORAL
  Filled 2012-01-17 (×7): qty 1

## 2012-01-17 NOTE — Progress Notes (Signed)
Physical Therapy Session Note  Patient Details  Name: [name] MRN: [id number] Date of Birth: 12-14-1945  Today's Date: 01/17/2012 Time: 1300-1400 Time Calculation (min): 60 min  Short Term Goals: Week 1:  PT Short Term Goal 1 (Week 1): Pt will gait x 20' with Mod A PT Short Term Goal 2 (Week 1): Pt will maintain dynamic standing balance with Min A PT Short Term Goal 3 (Week 1): Pt will transfer bed<>chair with Mod A  Skilled Therapeutic Interventions/Progress Updates:    Gait training 2 x 140' with RW, min assist. PT to block Lt. Knee hyperextension, facilitation of pelvic rotation. Verbal/tactile cues for Lt. Shoulder stability and prevention of Lt. Shoulder internal rotation. Pt educated on rotator cuff function and need for appropriate positioning to prevent injury. Performed 1/2 of ambulation  During session with Lt. Knee cage donned to prevent hyperextension. This allowed pt to better concentrate on Lt. Shoulder positioning. Performed repeated sit <> stands working on appropriate mechanics in particular anterior pelvic tilt and anterior trunk translation mod assist progressing to min-guard assist.   Therapy Documentation Precautions:  Precautions Precautions: Fall Precaution Comments: Educated pt on cervical precautions. Required Braces or Orthoses: Cervical Brace Cervical Brace: Hard collar Restrictions Weight Bearing Restrictions: No Other Position/Activity Restrictions: patient has personal knee brace he likes to wear for comfort Vital Signs: Therapy Vitals Temp: 98.1 F (36.7 C) Temp src: Oral Pulse Rate: 64  Resp: 18  BP: 127/74 mmHg Patient Position, if appropriate: Sitting Oxygen Therapy SpO2: 99 % O2 Device: None (Room air) Pain: Pain Assessment Pain Score: 0-No pain Faces Pain Scale: No hurt  See FIM for current functional status  Therapy/Group: Individual Therapy  Wilhemina Bonito 01/17/2012, 5:41 PM

## 2012-01-17 NOTE — Plan of Care (Signed)
Problem: SCI BOWEL ELIMINATION Goal: RH STG SCI MANAGE BOWEL WITH MEDICATION WITH ASSISTANCE STG SCI Manage bowel with medication supervision assistance.  Outcome: Not Progressing Miralax given PRN  Problem: RH KNOWLEDGE DEFICIT Goal: RH STG INCREASE KNOWLEDGE OF DIABETES Patient verbalizes understanding the risk factors, normal blood sugar level, and diet for diabetes. Patient able to monitor his blood sugar regularly and complaint with medication regimen.  Outcome: Progressing Steroid induced; patient states understanding

## 2012-01-17 NOTE — Progress Notes (Signed)
Physical Therapy Session Note  Patient Details  Name: [name] MRN: [id number] Date of Birth: 05-08-1946  Today's Date: 01/17/2012 Time: 0930-1027 Time Calculation (min): 57 min  Short Term Goals: Week 1:  PT Short Term Goal 1 (Week 1): Pt will gait x 20' with Mod A PT Short Term Goal 2 (Week 1): Pt will maintain dynamic standing balance with Min A PT Short Term Goal 3 (Week 1): Pt will transfer bed<>chair with Mod A  Skilled Therapeutic Interventions/Progress Updates:    All sit <> stands min/mod assist, verbal cues for mechanics and efficiency. Gait training over controlled environment x 70', 50' with seated rest in between. Verbal/tactile cues for Lt. Scapular stability (minimize winging) and to keep elbow "by side" to decrease Lt. Shoulder internal rotation compensations. PT to manually block Lt. Knee hyperextension. Pt able to moderately control compensations however ability diminishes with fatigue. Performed knee modulation control activities stepping up onto step and lifting Rt. LE. PT facilitating knee control. Educated pt on importance of decreasing compensations to decrease risk of further injury to Lt. Shoulder and Lt. Knee. At end of session pt requested to be left on commode for ~15 min for BM, educated on calling nursing prior to any standing and pt's risk of falls, pt verbalized understanding. Notified pt's RN.   Therapy Documentation Precautions:  Precautions Precautions: Fall Precaution Comments: Educated pt on cervical precautions. Required Braces or Orthoses: Cervical Brace Cervical Brace: Hard collar Restrictions Weight Bearing Restrictions: No Other Position/Activity Restrictions: patient has personal knee brace he likes to wear for comfort Pain: Pain Assessment Pain Assessment: No/denies pain Pain Score: 0-No pain  Locomotion : Ambulation Ambulation/Gait Assistance: 4: Min assist   See FIM for current functional status  Therapy/Group: Individual  Therapy  Wilhemina Bonito 01/17/2012, 10:33 AM

## 2012-01-17 NOTE — Progress Notes (Signed)
Social Work Assessment and Plan Social Work Assessment and Plan  Patient Details  Name: [name] MRN: [id number] Date of Birth: Dec 14, 1945  Today's Date: 01/17/2012  Problem List:  Patient Active Problem List  Diagnosis  . Respiratory failure, post-operative  . Hyperkalemia  . Hypoxemia  . Weakness  . Myelopathy   Past Medical History:  Past Medical History  Diagnosis Date  . Neuromuscular disorder   . Hypertension   . High cholesterol   . Type II diabetes mellitus   . Arthritis     "right knee; left hip"   Past Surgical History:  Past Surgical History  Procedure Date  . Anterior cervical decomp/discectomy fusion 2008  . Posterior fusion cervical spine 01/04/12  . Joint replacement     Left Hip Replacement  . Total hip arthroplasty 1999    left  . Excisional hemorrhoidectomy 1970's  . Inguinal hernia repair ~ 1975    left  . Posterior cervical laminectomy 01/04/2012    Procedure: POSTERIOR CERVICAL LAMINECTOMY;  Surgeon: Clydene Fake, MD;  Location: [facility];  Service: Neurosurgery;  Laterality: N/A;  Cervical Two to Thoracic One2  Decompressive Laminectomy, Fusion Cervical Two to Thoracic One with Instrumentation  . Posterior cervical laminectomy 01/09/2012    Procedure: POSTERIOR CERVICAL LAMINECTOMY;  Surgeon: Hewitt Shorts, MD;  Location: [facility];  Service: Neurosurgery;  Laterality: N/A;  Evacuation of Epidural Hematoma   Social History:  reports that he has been smoking Cigarettes.  He has a 35 pack-year smoking history. He has quit using smokeless tobacco. He reports that he drinks alcohol. He reports that he does not use illicit drugs.  Family / Support Systems Marital Status: Separated How Long?: 5 years Patient Roles: Parent Children: Roberts Bon [phone] cell Other Supports: Daughter and Daughter in-law Anticipated Caregiver: Working on a discharge plan-pt and family Ability/Limitations of Caregiver: Son is a Financial risk analyst at [employer], working on  24 hour care Caregiver Availability: 24/7 Family Dynamics: Close knit family who is willing to do for one another.  Visits and checks on daily, coming up with a discharge plan for pt.  Social History Preferred language: English Religion: Non-Denominational Cultural Background: No issues Education: High School Read: Yes Write: Yes Employment Status: Retired Fish farm manager Issues: Going through divorce proceedings Guardian/Conservator: None   Abuse/Neglect Physical Abuse: Denies Verbal Abuse: Denies Sexual Abuse: Denies Exploitation of patient/patient's resources: Denies Self-Neglect: Denies  Emotional Status Pt's affect, behavior adn adjustment status: Pt is motivated to regain as much independence as possible, he wants to take car eof himself at home.  He has always been very independent and prided himself on this. Recent Psychosocial Issues: Other health issues Pyschiatric History: No issues-deferred Depression Screen due to pt feels coping appropriately with his hospitalization Substance Abuse History: No issues  Patient / Family Perceptions, Expectations & Goals Pt/Family understanding of illness & functional limitations: Pt is able to explain his diagnosis and surgery.  He hopes to continue to make progress each day and regain his movement back.  He reports having a bad knee does not help to begin with. Premorbid pt/family roles/activities: Father, Uncle, Retiree, [employer], etc Anticipated changes in roles/activities/participation: Plans to resume Pt/family expectations/goals: Pt states: " I am hopeful I will get back to my independent level eventually, I know it will take time to recover."    [employer]: Other (Comment) (Lives in senior indenpendent apt) Premorbid Home Care/DME Agencies: None Transportation available at discharge: [employer]  referrals recommended: Support group (specify) (Back Support Group)  Discharge  Planning Living Arrangements: Alone Support Systems: Children;Other relatives;Friends/neighbors;Church/faith community Type of Residence: Private residence Insurance Resources: [employer] Financial Resources: Restaurant manager, fast food Screen Referred: No Living Expenses: Rent Money Management: Patient Do you have any problems obtaining your medications?: No Home Management: Pt but may get some assistance from where he lives, will Administrator Patient/Family Preliminary Plans: Return home with family assisting with his care, also to contact apt management to see if any resources are available for assist there.  Possibly getting some home management assist Social Work Anticipated Follow Up Needs: HH/OP;Support Group  Clinical Impression Pleasant gentleman who is motivated to improve and become as independent as possible prior to discharge.  Family working on 24 hour care plan. Supportive family who are willing to assist at discharge  Lucy Chris 01/17/2012, 9:40 AM

## 2012-01-17 NOTE — Progress Notes (Signed)
Occupational Therapy Session Note  Patient Details  Name: [name] MRN: [id number] Date of Birth: 10-09-1945  Today's Date: 01/17/2012  Session 1 Time: 0800-0845 Time Calculation (min):  Short Term Goals: Week 1:  OT Short Term Goal 1 (Week 1): Patient will perform UB dressing with min assist OT Short Term Goal 2 (Week 1): Patient will perform LB dressing with moderate assistance OT Short Term Goal 3 (Week 1): A HEP will be implemented with patient and education will be started regarding the program OT Short Term Goal 4 (Week 1): Patient will perform toilet transfers with min assist using DME prn  Skilled Therapeutic Interventions/Progress Updates:    Pt engaged in ADL retraining including bathing and dressing w/c level at sink.  Pt requires assistance with bathing RUE.  Pt requires verbal cues for LE positioning prior to standing.  Educated patient on compensatory strategies/techniques for bathing activities. Focus on activity tolerance, safety awareness, and dynamic standing balance. Therapy Documentation Precautions:  Precautions Precautions: Fall Precaution Comments: Educated pt on cervical precautions. Required Braces or Orthoses: Cervical Brace Cervical Brace: Hard collar Restrictions Weight Bearing Restrictions: No Other Position/Activity Restrictions: patient has personal knee brace he likes to wear for comfort   Pain: Pt denies pain  See FIM for current functional status  Therapy/Group: Individual Therapy  Session 2 Time: 1130-1200 Pt c/o 7/10 pain in neck; RN present and made aware Individual Therapy  Pt amb with RW into bathroom in ADL to practice tub bench transfers X 3.  Pt transitioned to practicing bed transfers from standard bed height.  Pt stated his bed at home was considerably lower to ground but that he was making arrangements to have it raised.  Pt required steady assist with sit<>stand from surfaces.  [name] 01/17/2012, 12:23  PM

## 2012-01-17 NOTE — Progress Notes (Signed)
Patient ID: CALEN GEISTER, male   DOB: 05/29/1946, 66 y.o.   MRN: [id number] Patient ID: KELTEN ENOCHS, male   DOB: 21-Nov-1945, 66 y.o.   MRN: [id number] Subjective/Complaints: No problems over the weekend.. Other ros items reviewed and negative.  Still weaker on L side but leg is improving. Objective: Vital Signs: Blood pressure 111/64, pulse 53, temperature 97.5 F (36.4 C), temperature source Oral, resp. rate 18, height 6' (1.829 m), SpO2 96.00%. No results found. No results found for this basename: WBC:2,HGB:2,HCT:2,PLT:2 in the last 72 hours No results found for this basename: NA:2,K:2,CL:2,CO2:2,GLUCOSE:2,BUN:2,CREATININE:2,CALCIUM:2 in the last 72 hours CBG (last 3)   Basename 01/17/12 0750 01/17/12 0403 01/17/12 0010  GLUCAP 156* 206* 233*    Wt Readings from Last 3 Encounters:  01/12/12 82.6 kg (182 lb 1.6 oz)  01/12/12 82.6 kg (182 lb 1.6 oz)  01/12/12 82.6 kg (182 lb 1.6 oz)    Physical Exam:  General appearance: alert, cooperative, appears stated age and no distress Head: Normocephalic, without obvious abnormality, atraumatic Eyes: anicteric, non injected Ears: ext nl Nose: Nares normal. Septum midline. Mucosa normal. No drainage or sinus tenderness. Throat: lips, mucosa, and tongue normal; teeth and gums normal Neck: no adenopathy, no carotid bruit, no JVD, supple, symmetrical, trachea midline and thyroid not enlarged, symmetric, no tenderness/mass/nodules Back: symmetric, no curvature. ROM normal. No CVA tenderness. Resp: clear to auscultation bilaterally Cardio: regular rate and rhythm, S1, S2 normal, no murmur, click, rub or gallop GI: soft, non-tender; bowel sounds normal; no masses,  no organomegaly Extremities: extremities normal, atraumatic, no cyanosis or edema Pulses: 2+ and symmetric Skin: Skin color, texture, turgor normal. No rashes or lesions Neurologic: Left UE 1-2+ out of 5.  LLE 2+ to 3+/5 , RUE 4-/5, RLE 4/5, left heel cord a little  tight. Incision/Wound:  Clean and intact   Assessment/Plan: 1. Functional deficits secondary to cervical stenosis with myelopathy and radiculopathy which require 3+ hours per day of interdisciplinary therapy in a comprehensive inpatient rehab setting. Physiatrist is providing close team supervision and 24 hour management of active medical problems listed below. Physiatrist and rehab team continue to assess barriers to discharge/monitor patient progress toward functional and medical goals. FIM: FIM - Bathing Bathing Steps Patient Completed: Chest;Left Arm;Front perineal area;Abdomen;Buttocks;Right upper leg;Left upper leg;Right lower leg (including foot);Left lower leg (including foot) Bathing: 4: Min-Patient completes 8-9 81f 10 parts or 75+ percent  FIM - Upper Body Dressing/Undressing Upper body dressing/undressing steps patient completed: Thread/unthread right sleeve of pullover shirt/dresss;Pull shirt over trunk Upper body dressing/undressing: 3: Mod-Patient completed 50-74% of tasks FIM - Lower Body Dressing/Undressing Lower body dressing/undressing steps patient completed: Pull pants up/down Lower body dressing/undressing: 2: Max-Patient completed 25-49% of tasks  FIM - Toileting Toileting: 0: Activity did not occur  FIM - Archivist Transfers: 0-Activity did not occur  FIM - Banker Devices: HOB elevated;Bed rails;Walker Bed/Chair Transfer: 3: Bed > Chair or W/C: Mod A (lift or lower assist);6: Supine > Sit: No assist  FIM - Locomotion: Wheelchair Distance: 30' Locomotion: Wheelchair: 2: Travels 50 - 149 ft with minimal assistance (Pt.>75%) FIM - Locomotion: Ambulation Locomotion: Ambulation Assistive Devices: Walker - Rolling (Lt hand orthosis) Ambulation/Gait Assistance: 4: Min guard Locomotion: Ambulation: 2: Travels 50 - 149 ft with minimal assistance (Pt.>75%)  Comprehension Comprehension Mode:  Auditory Comprehension: 7-Follows complex conversation/direction: With no assist  Expression Expression Mode: Verbal Expression: 7-Expresses complex ideas: With no assist  Social Interaction Social Interaction: 6-Interacts appropriately  with others with medication or extra time (anti-anxiety, antidepressant).  Problem Solving Problem Solving: 4-Solves basic 75 - 89% of the time/requires cueing 10 - 24% of the time  Memory Memory: 7-Complete Independence: No helper  1. Cervical myelopathy. Status post C2-C7 laminectomy and fusion 01/04/2012 with postoperative epidural hematoma evacuation 01/09/2012  2. DVT Prophylaxis/Anticoagulation: SCDs. Monitor for any signs of DVT  3.. Pain Management: Decadron taper as advised, Robaxin and Percocet as needed. Monitor with increased mobility  4. Neuropsych: This patient is capable of making decisions on his/her own behalf.  5. Hypertension. Presently on no antihypertensive medication. Patient on lisinopril 10 mg daily prior to admission. Will monitor with increased mobility  6. Diabetes mellitus. Blood sugars mid 200s and monitored while on Decadron. Will resume Glucotrol 5 mg twice a day as prior to admission. Patient also on Glucophage 1000 mg twice a day prior to admission which i will resume initially at 500mg . Decadron tapering off. 7. Hyperlipidemia. Zocor  8. Neurogenic bowel: augmented bowel regimen senna S qhs. enema  LOS (Days) 5 A FACE TO FACE EVALUATION WAS PERFORMED  Maryl Blalock T 01/17/2012, 8:09 AM

## 2012-01-17 NOTE — Progress Notes (Signed)
Per State Regulation 482.30 This chart was reviewed for medical necessity with respect to the patient's Admission/Duration of stay. Pt motivated and participating in therapies with steady progress. Meryl Dare                 Nurse Care Manager            Next Review Date: 01/17/12

## 2012-01-18 LAB — GLUCOSE, CAPILLARY
Glucose-Capillary: 181 mg/dL — ABNORMAL HIGH (ref 70–99)
Glucose-Capillary: 112 mg/dL — ABNORMAL HIGH (ref 70–99)
Glucose-Capillary: 144 mg/dL — ABNORMAL HIGH (ref 70–99)
Glucose-Capillary: 240 mg/dL — ABNORMAL HIGH (ref 70–99)

## 2012-01-18 NOTE — Progress Notes (Signed)
Physical Therapy Session Note  Patient Details  Name: [name] MRN: [id number] Date of Birth: May 04, 1946  Today's Date: 01/18/2012 Time: 1600-1650 Time Calculation (min): 50 min  Short Term Goals: Week 1:  PT Short Term Goal 1 (Week 1): Pt will gait x 20' with Mod A PT Short Term Goal 2 (Week 1): Pt will maintain dynamic standing balance with Min A PT Short Term Goal 3 (Week 1): Pt will transfer bed<>chair with Mod A  Skilled Therapeutic Interventions/Progress Updates:    Updated short term goals due to progress. Placed heel lift in Lt. Shoe, appears to help with Lt. Knee control although pt has increased difficulty controlling at very end of 150' walk due to fatigue. Uncertain if heel lift or AFO will be more appropriate as pt also has Lt. Foot supination and minimal clearance. Will monitor.  Gait training 2 x 150' with RW + Lt. Hand splint, min-guard assist focusing on LE mechanics, foot clearance, and Lt. Shoulder stability. Gait training with single point cane, pt requires constant min assist for balance. 2 steps performed with bil. Railing, min assist. Only able to ascend with Lt. LE. Dynamic balance activity without UE support placing horseshoes at high and low targets performed to decrease UE reliance on walker.   Therapy Documentation Precautions:  Precautions Precautions: Fall Precaution Comments: Educated pt on cervical precautions. Required Braces or Orthoses: Cervical Brace Cervical Brace: Hard collar Restrictions Weight Bearing Restrictions: No Other Position/Activity Restrictions: patient has personal knee brace he likes to wear for comfort Vital Signs: Therapy Vitals Temp: 98.2 F (36.8 C) Temp src: Oral Pulse Rate: 72  Resp: 20  BP: 121/78 mmHg Patient Position, if appropriate: Lying Oxygen Therapy SpO2: 94 % O2 Device: None (Room air) Pain: Pain Assessment Pain Assessment: 0-10 Pain Score: 6/10 Pain Type: Chronic pain Pain Location: Knee Pain  Orientation: Right Pain Descriptors: Aching;Nagging Pain Onset: With Activity Pain Intervention(s): Repositioned;Rest Locomotion : Ambulation Ambulation/Gait Assistance: 4: Min assist   See FIM for current functional status  Therapy/Group: Individual Therapy  Wilhemina Bonito 01/18/2012, 5:38 PM

## 2012-01-18 NOTE — Progress Notes (Signed)
Patient ID: [name], male   DOB: 03-19-1946, 66 y.o.   MRN: [id number] Subjective/Complaints: Left hand weak still. Able to eat breakfast with set up using right hand. Still weaker on L side but leg is improving. Objective: Vital Signs: Blood pressure 117/67, pulse 65, temperature 99.1 F (37.3 C), temperature source Oral, resp. rate 19, height 6' (1.829 m), SpO2 97.00%. No results found. No results found for this basename: WBC:2,HGB:2,HCT:2,PLT:2 in the last 72 hours No results found for this basename: NA:2,K:2,CL:2,CO2:2,GLUCOSE:2,BUN:2,CREATININE:2,CALCIUM:2 in the last 72 hours CBG (last 3)   Basename 01/18/12 0412 01/18/12 0022 01/17/12 1952  GLUCAP 181* 180* 222*    Wt Readings from Last 3 Encounters:  01/12/12 82.6 kg (182 lb 1.6 oz)  01/12/12 82.6 kg (182 lb 1.6 oz)  01/12/12 82.6 kg (182 lb 1.6 oz)    Physical Exam:  General appearance: alert, cooperative, appears stated age and no distress Head: Normocephalic, without obvious abnormality, atraumatic Eyes: anicteric, non injected Ears: ext nl Nose: Nares normal. Septum midline. Mucosa normal. No drainage or sinus tenderness. Throat: lips, mucosa, and tongue normal; teeth and gums normal Neck: no adenopathy, no carotid bruit, no JVD, supple, symmetrical, trachea midline and thyroid not enlarged, symmetric, no tenderness/mass/nodules Back: symmetric, no curvature. ROM normal. No CVA tenderness. Resp: clear to auscultation bilaterally Cardio: regular rate and rhythm, S1, S2 normal, no murmur, click, rub or gallop GI: soft, non-tender; bowel sounds normal; no masses,  no organomegaly Extremities: extremities normal, atraumatic, no cyanosis or edema Pulses: 2+ and symmetric Skin: Skin color, texture, turgor normal. No rashes or lesions Neurologic: Left UE 1-2+ out of 5.  LLE 2+ to 3+/5 , RUE 4-/5, RLE 4/5, left heel cord a little tight. Incision/Wound:  Clean and intact   Assessment/Plan: 1. Functional deficits  secondary to cervical stenosis with myelopathy and radiculopathy which require 3+ hours per day of interdisciplinary therapy in a comprehensive inpatient rehab setting. Physiatrist is providing close team supervision and 24 hour management of active medical problems listed below. Physiatrist and rehab team continue to assess barriers to discharge/monitor patient progress toward functional and medical goals.  Would he benefit from a cock up/tenodesis splint to assist using left hand. FIM: FIM - Bathing Bathing Steps Patient Completed: Chest;Left Arm;Front perineal area;Abdomen;Buttocks;Right upper leg;Left upper leg;Right lower leg (including foot);Left lower leg (including foot) Bathing: 4: Min-Patient completes 8-9 34f 10 parts or 75+ percent  FIM - Upper Body Dressing/Undressing Upper body dressing/undressing steps patient completed: Thread/unthread left sleeve of pullover shirt/dress;Thread/unthread right sleeve of pullover shirt/dresss;Pull shirt over trunk Upper body dressing/undressing: 4: Min-Patient completed 75 plus % of tasks FIM - Lower Body Dressing/Undressing Lower body dressing/undressing steps patient completed: Pull pants up/down;Thread/unthread right pants leg;Thread/unthread left pants leg;Don/Doff left shoe;Don/Doff right shoe Lower body dressing/undressing: 4: Steadying Assist  FIM - Toileting Toileting steps completed by patient: Adjust clothing prior to toileting;Performs perineal hygiene;Adjust clothing after toileting Toileting Assistive Devices: Grab bar or rail for support Toileting: 4: Steadying assist  FIM - Archivist Transfers: 4-To toilet/BSC: Min A (steadying Pt. > 75%)  FIM - Bed/Chair Transfer Bed/Chair Transfer Assistive Devices: HOB elevated;Bed rails;Walker Bed/Chair Transfer: 3: Bed > Chair or W/C: Mod A (lift or lower assist);6: Supine > Sit: No assist  FIM - Locomotion: Wheelchair Distance: 30' Locomotion: Wheelchair: 1: Total  Assistance/staff pushes wheelchair (Pt<25%) FIM - Locomotion: Ambulation Locomotion: Ambulation Assistive Devices: Walker - Rolling;Other (comment) (with Lt. hand splint) Ambulation/Gait Assistance: 4: Min assist Locomotion: Ambulation: 2: Travels 50 -  149 ft with minimal assistance (Pt.>75%)  Comprehension Comprehension Mode: Auditory Comprehension: 7-Follows complex conversation/direction: With no assist  Expression Expression Mode: Verbal Expression: 7-Expresses complex ideas: With no assist  Social Interaction Social Interaction: 6-Interacts appropriately with others with medication or extra time (anti-anxiety, antidepressant).  Problem Solving Problem Solving: 5-Solves basic problems: With no assist  Memory Memory: 7-Complete Independence: No helper  1. Cervical myelopathy. Status post C2-C7 laminectomy and fusion 01/04/2012 with postoperative epidural hematoma evacuation 01/09/2012  2. DVT Prophylaxis/Anticoagulation: SCDs. Monitor for any signs of DVT  3.. Pain Management: Decadron taper as advised, Robaxin and Percocet as needed. Monitor with increased mobility   -encouraged taking a percocet before he gets started each day so as not to let pain get out of control. 4. Neuropsych: This patient is capable of making decisions on his/her own behalf.  5. Hypertension. Presently on no antihypertensive medication. Patient on lisinopril 10 mg daily prior to admission. Will monitor with increased mobility  6. Diabetes mellitus. Blood sugars mid 200s and monitored while on Decadron. Will resume Glucotrol 5 mg twice a day as prior to admission. Patient also on Glucophage 1000 mg twice a day prior to admission which i will resume initially at 500mg --may need to go back to home dose tomorrow.. Decadron tapering off.  7. Hyperlipidemia. Zocor  8. Neurogenic bowel: augmented bowel regimen senna S qhs. enema  LOS (Days) 6 A FACE TO FACE EVALUATION WAS PERFORMED  Demorris Choyce  T 01/18/2012, 7:42 AM

## 2012-01-18 NOTE — Progress Notes (Signed)
Social Work Patient ID: [name], male   DOB: 05/31/1946, 66 y.o.   MRN: [id number] Met with pt to inform of team conference goals-supervision/min level and discharge 7/12. Pt felt he would be here longer like the 19th.  Discussed will continue to work toward goals and Have a weekly team conference.  Asked him if family could come in and attend therapies to see the Assistance he requires.  He is aware of the alternatives of short term NHP if feels he is not ready to return home On the 12th.  He and son have discussed this option.  He wants to be as independent as possible before going Home, at time she will have not have someone with him at all times.  Continue to work on discharge plans.

## 2012-01-18 NOTE — Progress Notes (Signed)
Occupational Therapy Session Note  Patient Details  Name: [name] MRN: [id number] Date of Birth: 1945/11/02  Today's Date: 01/18/2012  Session 1 Time: 4098-1191 Time Calculation (min): 60 min  Short Term Goals: Week 1:  OT Short Term Goal 1 (Week 1): Patient will perform UB dressing with min assist OT Short Term Goal 2 (Week 1): Patient will perform LB dressing with moderate assistance OT Short Term Goal 3 (Week 1): A HEP will be implemented with patient and education will be started regarding the program OT Short Term Goal 4 (Week 1): Patient will perform toilet transfers with min assist using DME prn  Skilled Therapeutic Interventions/Progress Updates:    Pt amb with RW to bathroom for bathing at shower level.  Pt uses long handle sponge to assist with LB bathing and RUE axillary.  Pt incorporating compensatory strategies with bathing and dressing.  Pt completed dressing with sit<>stand from EOB using reacher to assist with threading pants.  Pt requires assistance donning right knee brace.  Focus on activity tolerance, standing balance, and safety awareness.  Therapy Documentation Precautions:  Precautions Precautions: Fall Precaution Comments: Educated pt on cervical precautions. Required Braces or Orthoses: Cervical Brace Cervical Brace: Hard collar Restrictions Weight Bearing Restrictions: No Other Position/Activity Restrictions: patient has personal knee brace he likes to wear for comfort  See FIM for current functional status Pt denies pain Therapy/Group: Individual Therapy  Session 2 Time: 1330-1400 Pt denies pain Individual Therapy Pt engaged in neuro reed with weight bearing through LUE while patient activates musculature in scapula to increase strength and function.  Pt transitioned to left wrist flexion/extension and finger flexion/extension activities/exercieses with emphasis on muscle group isolation and decreased recruitment.  Pt exhibiting slight wrist  flexion and trace grasp in digits 3,4,5.  [name] 01/18/2012, 3:35 PM

## 2012-01-18 NOTE — Evaluation (Signed)
Recreational Therapy Assessment and Plan  Patient Details  Name: [name] MRN: [id number] Date of Birth: 1946-03-26 Today's Date: 01/18/2012  Rehab Potential: Good ELOS: 2 weeks   Assessment Clinical Impression:Problem List:  Patient Active Problem List   Diagnosis   .  Respiratory failure, post-operative   .  Hyperkalemia   .  Hypoxemia   .  Weakness   .  Myelopathy    Past Medical History:  Past Medical History   Diagnosis  Date   .  Neuromuscular disorder    .  Hypertension    .  High cholesterol    .  Type II diabetes mellitus    .  Arthritis      "right knee; left hip"    Past Surgical History:  Past Surgical History   Procedure  Date   .  Anterior cervical decomp/discectomy fusion  2008   .  Posterior fusion cervical spine  01/04/12   .  Joint replacement      Left Hip Replacement   .  Total hip arthroplasty  1999     left   .  Excisional hemorrhoidectomy  1970's   .  Inguinal hernia repair  ~ 1975     left   .  Posterior cervical laminectomy  01/04/2012     Procedure: POSTERIOR CERVICAL LAMINECTOMY; Surgeon: Clydene Fake, MD; Location: [facility]; Service: Neurosurgery; Laterality: N/A; Cervical Two to Thoracic One2 Decompressive Laminectomy, Fusion Cervical Two to Thoracic One with Instrumentation   .  Posterior cervical laminectomy  01/09/2012     Procedure: POSTERIOR CERVICAL LAMINECTOMY; Surgeon: Hewitt Shorts, MD; Location: [facility]; Service: Neurosurgery; Laterality: N/A; Evacuation of Epidural Hematoma    Assessment & Plan  Clinical Impression: Patient is a 66 y.o. year old male with recent admission to the hospital on 01/04/2012 with weakness upper extremities that has steadily worsened. X-rays and imaging revealed cervical stenosis with myelopathy. Underwent posterior cervical laminectomy C2-T1 with posterior fusion C2-T1 01/04/2012 per Dr. Phoebe Perch. Postoperatively patient developed progressive weakness that prompted an MRI of the neck that  showed a large fluid collection in the surgical plane compressing the spinal cord. Underwent emergent exploration of posterior cervical wound and evacuation of wound an epidural hematoma 01/09/2012 per Dr. Newell Coral. Patient has been fitted with cervical hard collar brace. Postoperative pain management. Remains on Decadron protocol. Patient transferred to CIR on 01/12/2012 .   Pt presents with decreased functional mobility, decreased balance, decreased functional use of of LUE limiting pt's independence with leisure/community pursuits. Leisure History/Participation Premorbid leisure interest/current participation: Sports - Golf;Sports - Basketball;Sports - Baseball;Community - Physicist, medical (all sports except water sports) Other Leisure Interests: Television;Computer Leisure Participation Style: Alone;With Family/Friends Awareness of Community Resources: Good-identify 3 post discharge leisure resources Psychosocial / Spiritual Spiritual Interests: Church Does patient have pets?: No Social interaction - Mood/Behavior: Cooperative Film/video editor for Education?: Yes Recreational Therapy Orientation Orientation -Reviewed with patient: Available activity resources Strengths/Weaknesses Patient Strengths/Abilities: Willingness to participate;Active premorbidly Patient weaknesses: Physical limitations  Plan Rec Therapy Plan Is patient appropriate for Therapeutic Recreation?: Yes Rehab Potential: Good Treatment times per week: Min 1 time per week .20 minutes Estimated Length of Stay: 2 weeks TR Treatment/Interventions: Adaptive equipment instruction;1:1 session;Balance/vestibular training;Community reintegration;Functional mobility training;Patient/family education;Recreation/leisure participation;Therapeutic activities;Therapeutic exercise;UE/LE Coordination activities  Recommendations for other services: None  Discharge Criteria: Patient will be discharged from TR if patient  refuses treatment 3 consecutive times without medical reason.  If treatment goals not met,  if there is a change in medical status, if patient makes no progress towards goals or if patient is discharged from hospital.  The above assessment, treatment plan, treatment alternatives and goals were discussed and mutually agreed upon: by patient  [name] 01/18/2012, 4:55 PM

## 2012-01-18 NOTE — Progress Notes (Signed)
Physical Therapy Session Note  Patient Details  Name: [name] MRN: [id number] Date of Birth: 06-11-1946  Today's Date: 01/18/2012 Time: 0730-0829 Time Calculation (min): 59 min  Short Term Goals: Week 1:  PT Short Term Goal 1 (Week 1): Pt will gait x 20' with Mod A PT Short Term Goal 2 (Week 1): Pt will maintain dynamic standing balance with Min A PT Short Term Goal 3 (Week 1): Pt will transfer bed<>chair with Mod A  Skilled Therapeutic Interventions/Progress Updates:    Pt eating breakfast at start, donned TED hose and shoes while pt finished. Gait in room environment x 40' practicing side stepping, forwards and backwards walking, tight turns and negotiation performed with min assist, PT to block Lt. knee hyperextension at times although improved. Sit <> stands for conditioning and practice of appropriate mechanics x 10 reps. Forward foot taps, PT facilitating Lt. Knee control working on decreased UE reliance and single limb stance. Lt. Shoulder resisted (orange band) external rotation and then non-resisted bil. Scapular rows to increase shoulder stability 3 x 10 reps each.  Pt reports that he is able to better control Lt. Knee by putting weight on toes, may benefit from heel lift to control hyperextension.   Therapy Documentation Precautions:  Precautions Precautions: Fall Precaution Comments: Educated pt on cervical precautions. Required Braces or Orthoses: Cervical Brace Cervical Brace: Hard collar Restrictions Weight Bearing Restrictions: No Other Position/Activity Restrictions: patient has personal knee brace he likes to wear for comfort Vital Signs: Therapy Vitals Temp: 99.1 F (37.3 C) Temp src: Oral Pulse Rate: 65  Resp: 19  BP: 117/67 mmHg Patient Position, if appropriate: Lying Oxygen Therapy SpO2: 97 % O2 Device: None (Room air) Pain: Pain Assessment Pain Assessment: 0-10 Pain Score:7 Pain Type: Surgical pain Pain Location: Neck Pain Orientation:  Posterior Pain Descriptors: Aching Pain Onset: On-going Pain Intervention(s): RN made aware;Repositioned;Other (Comment) (requested pain medication)   See FIM for current functional status  Therapy/Group: Individual Therapy  Wilhemina Bonito 01/18/2012, 9:31 AM

## 2012-01-18 NOTE — Patient Care Conference (Addendum)
Inpatient RehabilitationTeam Conference Note Date: 01/18/2012   Time: 2:05 PM    Patient Name: [name]      Medical Record Number: [id number]  Date of Birth: [birth year] Sex: Male         Room/Bed: 4008/4008-01 Payor Info: Payor: MEDICARE  Plan: MEDICARE PART A AND B  Product Type: *No Product type*     Admitting Diagnosis: Cervical myelopathy s/p fusion  Admit Date/Time:  01/12/2012  2:22 PM Admission Comments: No comment available   Primary Diagnosis:  Myelopathy Principal Problem: Myelopathy  Patient Active Problem List   Diagnosis Date Noted  . Myelopathy 01/12/2012  . Hyperkalemia 01/11/2012  . Hypoxemia 01/11/2012  . Weakness 01/11/2012  . Respiratory failure, post-operative 01/10/2012    Expected Discharge Date: Expected Discharge Date: 01/28/12  Team Members Present: Physician: Dr. Faith Rogue Case Manager Present: Lutricia Horsfall, RN Social Worker Present: Dossie Der, LCSW Nurse Present: Daryll Brod, RN PT Present: Other (comment) Sherrine Maples, PT) OT Present: Edwin Cap, OT Other (Discipline and Name): Tora Duck, PPS Coordinator     Current Status/Progress Goal Weekly Team Focus  Medical   pain control better. left upper extremity weakness, especially distal  improved hand control and pain.   ?orthotic for wrist   Bowel/Bladder   Continent of Bowel and Bladder; LBM 6/27  patient will manage bowel with medication  assess need for medication PRN   Swallow/Nutrition/ Hydration             ADL's   min A bathing, mod A dressing, min A toilet transfers and toileting  supervision overall  LUE use, transfers, saety awareness   Mobility   Pt currently min assist for most mobility, has Lt. scapular winging and Lt. knee hyperextension at times.  supervision  Improve gait mechanics, decreasing dependence, decreasing falls risk   Communication             Safety/Cognition/ Behavioral Observations  N/A         Pain   Pain managed with  PRN medication  Patient will state pain is "less than 3"  Reassess need for medicaiton   Skin   N/A  remain free of infection and breakdown  monitor Q shift      *See Interdisciplinary Assessment and Plan and progress notes for long and short-term goals  Barriers to Discharge: left upper extremity weakness (hand manip)    Possible Resolutions to Barriers:  adaptive equipment, NMR    Discharge Planning/Teaching Needs:  Home to senior apt-with family providing assistance, unsure if has 24 hour care      Team Discussion:  Discussion of pt's dx, goals, d/c plan. Pt very motivated. Amb goal for home is min A. Question if 24/7 S is available.  Family needs to come for education.  Revisions to Treatment Plan:  none   Continued Need for Acute Rehabilitation Level of Care: The patient requires daily medical management by a physician with specialized training in physical medicine and rehabilitation for the following conditions: Daily direction of a multidisciplinary physical rehabilitation program to ensure safe treatment while eliciting the highest outcome that is of practical value to the patient.: Yes Daily medical management of patient stability for increased activity during participation in an intensive rehabilitation regime.: Yes Daily analysis of laboratory values and/or radiology reports with any subsequent need for medication adjustment of medical intervention for : Neurological problems;Other  Meryl Dare 01/18/2012, 2:47 PM

## 2012-01-19 DIAGNOSIS — M4712 Other spondylosis with myelopathy, cervical region: Secondary | ICD-10-CM

## 2012-01-19 DIAGNOSIS — N319 Neuromuscular dysfunction of bladder, unspecified: Secondary | ICD-10-CM

## 2012-01-19 DIAGNOSIS — Z5189 Encounter for other specified aftercare: Secondary | ICD-10-CM

## 2012-01-19 DIAGNOSIS — K592 Neurogenic bowel, not elsewhere classified: Secondary | ICD-10-CM

## 2012-01-19 LAB — GLUCOSE, CAPILLARY
Glucose-Capillary: 202 mg/dL — ABNORMAL HIGH (ref 70–99)
Glucose-Capillary: 259 mg/dL — ABNORMAL HIGH (ref 70–99)
Glucose-Capillary: 88 mg/dL (ref 70–99)

## 2012-01-19 MED ORDER — DEXAMETHASONE 0.5 MG PO TABS
1.0000 mg | ORAL_TABLET | Freq: Two times a day (BID) | ORAL | Status: AC
  Administered 2012-01-19: 1 mg via ORAL
  Filled 2012-01-19: qty 2

## 2012-01-19 MED ORDER — METFORMIN HCL 500 MG PO TABS
1000.0000 mg | ORAL_TABLET | Freq: Two times a day (BID) | ORAL | Status: DC
  Administered 2012-01-19 – 2012-01-20 (×4): 1000 mg via ORAL
  Filled 2012-01-19 (×8): qty 2

## 2012-01-19 NOTE — Progress Notes (Signed)
Subjective/Complaints: No new issues reported. Still weaker on L side but leg is improving. Objective: Vital Signs: Blood pressure 112/71, pulse 54, temperature 98.1 F (36.7 C), temperature source Oral, resp. rate 19, height 6' (1.829 m), SpO2 99.00%. No results found. No results found for this basename: WBC:2,HGB:2,HCT:2,PLT:2 in the last 72 hours No results found for this basename: NA:2,K:2,CL:2,CO2:2,GLUCOSE:2,BUN:2,CREATININE:2,CALCIUM:2 in the last 72 hours CBG (last 3)   Basename 01/19/12 0350 01/18/12 2359 01/18/12 2006  GLUCAP 214* 259* 240*    Wt Readings from Last 3 Encounters:  01/12/12 82.6 kg (182 lb 1.6 oz)  01/12/12 82.6 kg (182 lb 1.6 oz)  01/12/12 82.6 kg (182 lb 1.6 oz)    Physical Exam:  General appearance: alert, cooperative, appears stated age and no distress Head: Normocephalic, without obvious abnormality, atraumatic Eyes: anicteric, non injected Ears: ext nl Nose: Nares normal. Septum midline. Mucosa normal. No drainage or sinus tenderness. Throat: lips, mucosa, and tongue normal; teeth and gums normal Neck: no adenopathy, no carotid bruit, no JVD, supple, symmetrical, trachea midline and thyroid not enlarged, symmetric, no tenderness/mass/nodules Back: symmetric, no curvature. ROM normal. No CVA tenderness. Resp: clear to auscultation bilaterally Cardio: regular rate and rhythm, S1, S2 normal, no murmur, click, rub or gallop GI: soft, non-tender; bowel sounds normal; no masses,  no organomegaly Extremities: extremities normal, atraumatic, no cyanosis or edema Pulses: 2+ and symmetric Skin: Skin color, texture, turgor normal. No rashes or lesions Neurologic: Left UE 1-2+ out of 5.  LLE 2+ to 3+/5 , RUE 4-/5, RLE 4/5, left heel cord a little tight. Incision/Wound:  Clean and intact   Assessment/Plan: 1. Functional deficits secondary to cervical stenosis with myelopathy and radiculopathy which require 3+ hours per day of interdisciplinary therapy in a  comprehensive inpatient rehab setting. Physiatrist is providing close team supervision and 24 hour management of active medical problems listed below. Physiatrist and rehab team continue to assess barriers to discharge/monitor patient progress toward functional and medical goals.  Would he benefit from a cock up/tenodesis splint to assist using left hand. FIM: FIM - Bathing Bathing Steps Patient Completed: Chest;Left Arm;Front perineal area;Abdomen;Buttocks;Right upper leg;Left upper leg;Right lower leg (including foot);Left lower leg (including foot);Right Arm Bathing: 4: Min-Patient completes 8-9 58f 10 parts or 75+ percent  FIM - Upper Body Dressing/Undressing Upper body dressing/undressing steps patient completed: Thread/unthread left sleeve of pullover shirt/dress;Thread/unthread right sleeve of pullover shirt/dresss;Pull shirt over trunk Upper body dressing/undressing: 4: Min-Patient completed 75 plus % of tasks FIM - Lower Body Dressing/Undressing Lower body dressing/undressing steps patient completed: Pull pants up/down;Thread/unthread right pants leg;Thread/unthread left pants leg;Don/Doff left shoe;Don/Doff right shoe Lower body dressing/undressing: 4: Steadying Assist  FIM - Toileting Toileting steps completed by patient: Adjust clothing prior to toileting;Performs perineal hygiene;Adjust clothing after toileting Toileting Assistive Devices: Grab bar or rail for support Toileting: 0: Activity did not occur  FIM - Archivist Transfers: 4-To toilet/BSC: Min A (steadying Pt. > 75%)  FIM - Bed/Chair Transfer Bed/Chair Transfer Assistive Devices: Therapist, occupational: 5: Supine > Sit: Supervision (verbal cues/safety issues);4: Bed > Chair or W/C: Min A (steadying Pt. > 75%)  FIM - Locomotion: Wheelchair Distance: 30' Locomotion: Wheelchair: 1: Total Assistance/staff pushes wheelchair (Pt<25%) FIM - Locomotion: Ambulation Locomotion: Ambulation Assistive  Devices: Walker - Rolling (with Lt. hand splint) Ambulation/Gait Assistance: 4: Min assist Locomotion: Ambulation: 1: Travels less than 50 ft with minimal assistance (Pt.>75%)  Comprehension Comprehension Mode: Auditory Comprehension: 7-Follows complex conversation/direction: With no assist  Expression Expression Mode: Verbal  Expression: 7-Expresses complex ideas: With no assist  Social Interaction Social Interaction: 6-Interacts appropriately with others with medication or extra time (anti-anxiety, antidepressant).  Problem Solving Problem Solving: 4-Solves basic 75 - 89% of the time/requires cueing 10 - 24% of the time  Memory Memory: 7-Complete Independence: No helper  1. Cervical myelopathy. Status post C2-C7 laminectomy and fusion 01/04/2012 with postoperative epidural hematoma evacuation 01/09/2012  2. DVT Prophylaxis/Anticoagulation: SCDs. Monitor for any signs of DVT  3.. Pain Management: Decadron taper as advised, Robaxin and Percocet as needed. Monitor with increased mobility   -encouraged taking a percocet before he gets started each day so as not to let pain get out of control. 4. Neuropsych: This patient is capable of making decisions on his/her own behalf.  5. Hypertension. Presently on no antihypertensive medication. Patient on lisinopril 10 mg daily prior to admission. Will monitor with increased mobility  6. Diabetes mellitus. Blood sugars mid 200s and monitored while on Decadron. Will resume Glucotrol 5 mg twice a day as prior to admission. Patient also on Glucophage 1000 mg twice a day prior to admission which i will increase to today.. Decadron tapering off.  7. Hyperlipidemia. Zocor  8. Neurogenic bowel: augmented bowel regimen senna S qhs. enema  LOS (Days) 7 A FACE TO FACE EVALUATION WAS PERFORMED  Tamecca Artiga T 01/19/2012, 7:16 AM

## 2012-01-19 NOTE — Progress Notes (Signed)
Occupational Therapy Session Note  Patient Details  Name: [name] MRN: [id number] Date of Birth: June 26, 1946  Today's Date: 01/19/2012  Session 1 Time: 0700-0800 Time Calculation (min):  Short Term Goals: Week 1:  OT Short Term Goal 1 (Week 1): Patient will perform UB dressing with min assist OT Short Term Goal 2 (Week 1): Patient will perform LB dressing with moderate assistance OT Short Term Goal 3 (Week 1): A HEP will be implemented with patient and education will be started regarding the program OT Short Term Goal 4 (Week 1): Patient will perform toilet transfers with min assist using DME prn  Skilled Therapeutic Interventions/Progress Updates:    Pt amb with RW to bathroom for shower with sit to stand from 3:1.  Pt completed all bathing tasks with steady A while standing.  Pt uses long handle sponge for bathing right foot and right lower leg.  Pt completed dressing tasks with sit to stand from EOB.  Pt able to don pullover shirt with supervision this morning using different strategy.  Pt required steady A while standing to pull up pants.  Pt requires assistance donning right knee brace.  Focus on safety awareness, dynamic standing balance, and compensatory strategies.  Therapy Documentation Precautions:  Precautions Precautions: Fall Precaution Comments: Educated pt on cervical precautions. Required Braces or Orthoses: Cervical Brace Cervical Brace: Hard collar Restrictions Weight Bearing Restrictions: Yes Other Position/Activity Restrictions: patient has personal knee brace he likes to wear for comfort  See FIM for current functional status  Therapy/Group: Individual Therapy  Session 2 Time: 1015-1045 Pt denies pain Individual Therapy Pt engaged in LUE weight bearing activities and scapula retraction/protraction exercises.  Pt transitioned to wrist flexion/extension and finger flexion/extension activities with emphasis on isolation of muscle groups.  Pt continues  to exhibit considerable muscle group recruitment patterns with all activities. Pt exhibited increased wrist flexion and finger flexion in digits 3,4,5 but not yet functional.    Rich Brave 01/19/2012, 1:36 PM

## 2012-01-19 NOTE — Plan of Care (Signed)
Problem: Food- and Nutrition-Related Knowledge Deficit (NB-1.1) Goal: Nutrition education Formal process to instruct or train a patient/client in a skill or to impart knowledge to help patients/clients voluntarily manage or modify food choices and eating behavior to maintain or improve health.  Outcome: Completed/Met Date Met:  01/19/12 Nutrition Education Note  CBG (last 3)   Basename 01/19/12 1135 01/19/12 0757 01/19/12 0350  GLUCAP 88 79 214*   RD provided "Carbohydrate Counting for Diabetes" handout from the Academy of [employer] and Dietetics. Discussed different food groups and their effects on blood sugar, emphasizing carbohydrate-containing foods. Provided list of carbohydrates and recommended serving sizes of common foods.  RD provided "Heart Healthy Nutrition Therapy" handout from the Academy of Nutrition and Dietetics. Reviewed patient's dietary recall. Provided examples on ways to decrease sodium and fat intake in diet. Discouraged intake of processed foods and use of salt shaker. Encouraged fresh fruits and vegetables as well as whole grain sources of carbohydrates to maximize fiber intake.  Expect good compliance.  BMI is 24.7 - Pt's weight is WNL.  Current diet order is CHO Modified Medium, patient is consuming approximately 100% of meals at this time. Labs and medications reviewed. No further nutrition interventions warranted at this time. RD contact information provided. If additional nutrition issues arise, please re-consult RD.  Jarold Motto MS, RD, LDN Pager: [phone] After-hours pager: [phone]

## 2012-01-19 NOTE — Progress Notes (Signed)
Inpatient Diabetes Program Recommendations  AACE/ADA: New Consensus Statement on Inpatient Glycemic Control (2009)  Target Ranges:  Prepandial:   less than 140 mg/dL      Peak postprandial:   less than 180 mg/dL (1-2 hours)      Critically ill patients:  140 - 180 mg/dL   Reason for Visit: Hypoglycemia while using q 4 hr correction insulin  Inpatient Diabetes Program Recommendations Insulin - Basal: xxx Correction (SSI): Please change to tidwc and add the HS scale. rather than q 4 hrs.  Pt is eating.  Note: Thank you, Lenor Coffin, RN, CNS, Diabetes Coordinator [phone])

## 2012-01-19 NOTE — Progress Notes (Signed)
Physical Therapy Note  Patient Details  Name: [name] MRN: [id number] Date of Birth: 08/13/1945 Today's Date: 01/19/2012  1308-6578 (55 minutes) individual Pain : 3/10 RT knee/cervical /premedicated Focus of treatment: Therapeutic exercises focused on LT knee strengthening/control during extension in  Closed chain position; gait training with SPC; therapeutic activities focused on dynamic standing balance Treatment: Lt knee strengthening/control- up /down 4 inch step LT LE focusing on terminal knee extension without hyperextension 3 X 10; gait 50 feet X 2 SPC with vcs for sequencing and decreased LT knee hyperextension in stance; Biodex for weight shifts and limits of stability without UE support to facilitate LT knee control stance during weight shifts.   1345 - 1420 (40 minutes) individual  Pain: no complaint of pain Focus of treatment: LT LE knee extension control in closed chain control/ strengthening exercises; gait training/endurance with SPC avoiding obstacles. Treatment: Up/down 4 inch step LT LE 2 X 10 with no hyperextension noted; gait around obstacles min assist; X 50 feet; gait with SPC 160 feet X 1 with min assist for balance (instability during changing direction).  [name],JIM 01/19/2012, 9:44 AM

## 2012-01-20 LAB — GLUCOSE, CAPILLARY
Glucose-Capillary: 102 mg/dL — ABNORMAL HIGH (ref 70–99)
Glucose-Capillary: 129 mg/dL — ABNORMAL HIGH (ref 70–99)
Glucose-Capillary: 136 mg/dL — ABNORMAL HIGH (ref 70–99)
Glucose-Capillary: 152 mg/dL — ABNORMAL HIGH (ref 70–99)
Glucose-Capillary: 178 mg/dL — ABNORMAL HIGH (ref 70–99)
Glucose-Capillary: 69 mg/dL — ABNORMAL LOW (ref 70–99)

## 2012-01-20 MED ORDER — GLIPIZIDE 10 MG PO TABS
10.0000 mg | ORAL_TABLET | Freq: Every day | ORAL | Status: DC
  Administered 2012-01-21 – 2012-01-28 (×8): 10 mg via ORAL
  Filled 2012-01-20 (×9): qty 1

## 2012-01-20 MED ORDER — DICLOFENAC SODIUM 1 % TD GEL
Freq: Four times a day (QID) | TRANSDERMAL | Status: DC
  Administered 2012-01-20 – 2012-01-25 (×21): via TOPICAL
  Administered 2012-01-26: 1 via TOPICAL
  Administered 2012-01-26 – 2012-01-28 (×8): via TOPICAL
  Filled 2012-01-20: qty 100

## 2012-01-20 NOTE — Progress Notes (Addendum)
Physical Therapy Session Note  Patient Details  Name: [name] MRN: [id number] Date of Birth: 1946-03-17  Today's Date: 01/20/2012 Time: 0900-0957 Time Calculation (min): 57 min  Short Term Goals: Week 1:  PT Short Term Goal 1 (Week 1): Pt will ambulate 200' with min-guard assist PT Short Term Goal 1 - Progress (Week 1): Progressing toward goal PT Short Term Goal 2 (Week 1): Pt will maintain dynamic standing balance with out UE support with min assist x 2 min. PT Short Term Goal 2 - Progress (Week 1): Progressing toward goal PT Short Term Goal 3 (Week 1): Pt will transfer bed <> chair with min-guard assist PT Short Term Goal 3 - Progress (Week 1): Progressing toward goal  Skilled Therapeutic Interventions/Progress Updates:  Pain: 7/10 Rt. Knee pain with activity, pt reports he has received pain medication just prior to therapy.    Gait training x 130' with RW, min-guard assist, much improved Lt. Knee control however continues to need verbal cues to prevent hyperextension when fatigued. Attempted bowling without UE support, pt unable to tolerate due to Rt. Knee pain. [employer] toss with recreation therapist without UE support. PT and recreational therapist together working on pt's dynamic limits of stability with cognitive challenge, min assist for loss of balance. Obstacle course performed with pt using single point cane negotiating cones, stepping over obstacles and performing sit <> stands from varying height chairs. Seated resisted hip abduction (orange band 2 x 15 reps) to increase hip strength, hopefully begin to work on decreasing Rt. Knee adduction and decrease lateral arthritis pain.   Second Session Skilled Therapeutic Interventions/Progress Updates:  Time:  1301-1350 Time Calculation (min): 49 min Pain: 6/10 Rt. Knee with activity Gait with RW and Lt hand splint x 140' with min-guard assist. Verbal/tactile cues for Lt. Scapular stability and Lt. Knee modulation. Cues to begin  working on continuous RW movement to decrease pt trying to "pick up" RW with each step. Supine with knees bent resisted hip abduction with orange band 3 x 15 reps (pt demonstrating very weak Lt. Hip strength). Bridging with resisted bil. Hip abduction 5 x 30 sec hold, PT to facilitate glutes. Attempted mini-squats with resisted side stepping however this was too painful for pt's arthritic Rt. Knee. Pt able to tolerate holds in slight mini squat working on keeping hips in neutral position against resistive band around knees to work on hip strength. Educated pt on need for hip strengthening to decrease current stress at knee joints.    All resisted exercises performed with orange band.    Therapy Documentation Precautions:  Precautions Precautions: Fall Precaution Comments: Educated pt on cervical precautions. Required Braces or Orthoses: Cervical Brace Cervical Brace: Hard collar Restrictions Weight Bearing Restrictions: Yes Other Position/Activity Restrictions: patient has personal knee brace he likes to wear for comfort Locomotion Ambulation Ambulation/Gait Assistance: 4: Min guard  See FIM for current functional status  Therapy/Group: Individual Therapy both sessions  Wilhemina Bonito 01/20/2012, 12:27 PM

## 2012-01-20 NOTE — Progress Notes (Signed)
Occupational Therapy Note  Patient Details  Name: [name] MRN: [id number] Date of Birth: 04/16/1946 Today's Date: 01/20/2012  Time: 1400-1430 Pt denies pain Individual Therapy Fitted patient for wrist cockup splint on left UE.  Patient and therapist agreed that splint is too restrictive and inhibits limited use of LUE to assist with tasks and using walker safely.  Pt practiced doffing and donning right knee brace.  Pt demonstrated proficiency in donning brace while seated EOB.  Pt engaged in simple home management tasks while ambulating with RW and amb to BR (inclduing opening door and turning on light) to use toilet.  Pt supervision in all tasks this afternoon.   [name], [name] 01/20/2012, 2:50 PM

## 2012-01-20 NOTE — Progress Notes (Signed)
Occupational Therapy Weekly Progress Note and Session Note  Patient Details  Name: [name] MRN: [id number] Date of Birth: 12/13/1945  Today's Date: 01/20/2012 Time: 0700-0800 Time Calculation (min): 60 min  Patient has met 4 of 4 short term goals.  Pt progressing steadily since admission.  Pt is currently steady assist when standing for bathing, supervision for UB dressing, steady assist for LB dressing, and steady/min A for shower and toilet transfers.  Pt using AE (long handle sponge and reacher) appropriately for bathing and dressing tasks.  Pt exhibiting only trace left wrist flexion and grasp.    Patient continues to demonstrate the following deficits: decreased functional mobility, decreased functional use of bilateral UEs, decreased independence with ADLs & IADLs, and decreased independence with transfers. Therefore, patient will continue to benefit from skilled OT intervention to enhance overall performance with BADL, iADL and Reduce care partner burden.  Patient progressing toward long term goals..  Continue plan of care. Goals upgraded 7/04. See Care Plan/Paths for information regarding goals.   OT Short Term Goals Week 1:  OT Short Term Goal 1 (Week 1): Patient will perform UB dressing with min assist OT Short Term Goal 1 - Progress (Week 1): Met OT Short Term Goal 2 (Week 1): Patient will perform LB dressing with moderate assistance OT Short Term Goal 2 - Progress (Week 1): Met OT Short Term Goal 3 (Week 1): A HEP will be implemented with patient and education will be started regarding the program OT Short Term Goal 3 - Progress (Week 1): Met OT Short Term Goal 4 (Week 1): Patient will perform toilet transfers with min assist using DME prn OT Short Term Goal 4 - Progress (Week 1): Met  Skilled Therapeutic Interventions/Progress Updates:    Pt engaged in bathing at shower level and dressing with sit to stand from EOB.  Pt requires steady A when standing and transferring out  of walk-in shower.  Pt requires assistance with donning brace on right knee.  Focus on dynamic standing balance, transfers, and safety awareness.  Precautions:  Precautions Precautions: Fall Precaution Comments: Educated pt on cervical precautions. Required Braces or Orthoses: Cervical Brace Cervical Brace: Hard collar Restrictions Weight Bearing Restrictions: Yes Other Position/Activity Restrictions: patient has personal knee brace he likes to wear for comfort   Pain: Pain Assessment Pain Assessment: No/denies pain  See FIM for current functional status  Therapy/Group: Individual Therapy  Rich Brave 01/20/2012, 10:34 AM

## 2012-01-20 NOTE — Progress Notes (Signed)
Patient ID: [name], male   DOB: [birth year], 66 y.o.   MRN: [id number] Subjective/Complaints: No new issues reported other than low CBG today. Other ROS reviewed and negative. Objective: Vital Signs: Blood pressure 114/63, pulse 55, temperature 97.7 F (36.5 C), temperature source Oral, resp. rate 18, height 6' (1.829 m), weight 78.064 kg (172 lb 1.6 oz), SpO2 100.00%. No results found. No results found for this basename: WBC:2,HGB:2,HCT:2,PLT:2 in the last 72 hours No results found for this basename: NA:2,K:2,CL:2,CO2:2,GLUCOSE:2,BUN:2,CREATININE:2,CALCIUM:2 in the last 72 hours CBG (last 3)   Basename 01/20/12 0806 01/20/12 0354 01/20/12 0026  GLUCAP 102* 136* 69*    Wt Readings from Last 3 Encounters:  01/19/12 78.064 kg (172 lb 1.6 oz)  01/12/12 82.6 kg (182 lb 1.6 oz)  01/12/12 82.6 kg (182 lb 1.6 oz)    Physical Exam:  General appearance: alert, cooperative, appears stated age and no distress Head: Normocephalic, without obvious abnormality, atraumatic Eyes: anicteric, non injected Ears: ext nl Nose: Nares normal. Septum midline. Mucosa normal. No drainage or sinus tenderness. Throat: lips, mucosa, and tongue normal; teeth and gums normal Neck: no adenopathy, no carotid bruit, no JVD, supple, symmetrical, trachea midline and thyroid not enlarged, symmetric, no tenderness/mass/nodules Back: symmetric, no curvature. ROM normal. No CVA tenderness. Resp: clear to auscultation bilaterally Cardio: regular rate and rhythm, S1, S2 normal, no murmur, click, rub or gallop GI: soft, non-tender; bowel sounds normal; no masses,  no organomegaly Extremities: extremities normal, atraumatic, no cyanosis or edema Pulses: 2+ and symmetric Skin: Skin color, texture, turgor normal. No rashes or lesions Neurologic: Left UE 1-2+ out of 5.  LLE 2+ to 3+/5 , RUE 4-/5, RLE 4/5, left heel cord a little tight. Incision/Wound:  Clean and intact   Assessment/Plan: 1. Functional deficits  secondary to cervical stenosis with myelopathy and radiculopathy which require 3+ hours per day of interdisciplinary therapy in a comprehensive inpatient rehab setting. Physiatrist is providing close team supervision and 24 hour management of active medical problems listed below. Physiatrist and rehab team continue to assess barriers to discharge/monitor patient progress toward functional and medical goals.  Would he benefit from a cock up/tenodesis splint to assist using left hand. FIM: FIM - Bathing Bathing Steps Patient Completed: Chest;Left Arm;Front perineal area;Abdomen;Buttocks;Right upper leg;Left upper leg;Right lower leg (including foot);Left lower leg (including foot);Right Arm Bathing: 4: Steadying assist  FIM - Upper Body Dressing/Undressing Upper body dressing/undressing steps patient completed: Thread/unthread left sleeve of pullover shirt/dress;Thread/unthread right sleeve of pullover shirt/dresss;Pull shirt over trunk;Put head through opening of pull over shirt/dress Upper body dressing/undressing: 5: Supervision: Safety issues/verbal cues FIM - Lower Body Dressing/Undressing Lower body dressing/undressing steps patient completed: Pull pants up/down;Thread/unthread right pants leg;Thread/unthread left pants leg;Don/Doff left shoe;Don/Doff right shoe;Don/Doff right sock;Don/Doff left sock Lower body dressing/undressing: 4: Steadying Assist  FIM - Toileting Toileting steps completed by patient: Adjust clothing prior to toileting;Performs perineal hygiene;Adjust clothing after toileting Toileting Assistive Devices: Grab bar or rail for support Toileting: 0: Activity did not occur  FIM - Archivist Transfers: 4-To toilet/BSC: Min A (steadying Pt. > 75%);4-From toilet/BSC: Min A (steadying Pt. > 75%)  FIM - Bed/Chair Transfer Bed/Chair Transfer Assistive Devices: Therapist, occupational: 5: Supine > Sit: Supervision (verbal cues/safety issues);5: Bed > Chair or  W/C: Supervision (verbal cues/safety issues)  FIM - Locomotion: Wheelchair Distance: 30' Locomotion: Wheelchair: 1: Total Assistance/staff pushes wheelchair (Pt<25%) FIM - Locomotion: Ambulation Locomotion: Ambulation Assistive Devices: Emergency planning/management officer Ambulation/Gait Assistance: 4: Min guard Locomotion: Ambulation: 4:  Travels 150 ft or more with minimal assistance (Pt.>75%)  Comprehension Comprehension Mode: Auditory Comprehension: 7-Follows complex conversation/direction: With no assist  Expression Expression Mode: Verbal Expression: 7-Expresses complex ideas: With no assist  Social Interaction Social Interaction: 6-Interacts appropriately with others with medication or extra time (anti-anxiety, antidepressant).  Problem Solving Problem Solving: 4-Solves basic 75 - 89% of the time/requires cueing 10 - 24% of the time  Memory Memory: 7-Complete Independence: No helper  1. Cervical myelopathy. Status post C2-C7 laminectomy and fusion 01/04/2012 with postoperative epidural hematoma evacuation 01/09/2012  2. DVT Prophylaxis/Anticoagulation: SCDs. Monitor for any signs of DVT  3.. Pain Management: Decadron taper as advised, Robaxin and Percocet as needed. Monitor with increased mobility   -encouraged taking a percocet before he gets started each day so as not to let pain get out of control.  -voltaren gel 4. Neuropsych: This patient is capable of making decisions on his/her own behalf.  5. Hypertension. Presently on no antihypertensive medication. Patient on lisinopril 10 mg daily prior to admission. Will monitor with increased mobility  6. Diabetes mellitus. Blood sugars mid 200s and monitored while on Decadron. Will resume Glucotrol 5 mg twice a day as prior to admission. Patient also on Glucophage 1000 mg twice a day PTA. Hold PM dose for now. Decadron tapering off.  7. Hyperlipidemia. Zocor  8. Neurogenic bowel: augmented bowel regimen senna S qhs. enema  LOS (Days) 8 A FACE  TO FACE EVALUATION WAS PERFORMED  Mehtaab Mayeda T 01/20/2012, 8:14 AM

## 2012-01-20 NOTE — Progress Notes (Signed)
Recreational Therapy Session Note  Patient Details  Name: [name] MRN: [id number] Date of Birth: February 09, 1946 Today's Date: 01/20/2012 Time:  905-930 Pain: c/o knee pain with bowling activity, no c/o after switching activities Skilled Therapeutic Intervenctions/Progress Updates: Attempted bowling activity without UE support working on dynamic balance, UE use, and knee control- pt unable to tolerate due to R knee pain.  Pt stood without UE support for ball toss activity using BUE's with min assist and then a modified volleyball activity tapping theball with RUE reaching outside BOS with min assist.  Therapy/Group: Co-Treatment  Activity Level: Moderate:  Level of assist: Min Assist  Dava Rensch 01/20/2012, 9:58 AM

## 2012-01-20 NOTE — Progress Notes (Signed)
Alert and orientated x 3. Ambulates with RW with Min A and extra time. Remains continent of bowel and bladder. Last bowel movement 01/19/12. Posterior cervical incision with staples, OTA, unremarkable. Compliant with use of R knee brace. Pain managed with topical Voltaren cream to R knee, as well as oral Percocet x 2 at 0748 and 1219. Pt reports effectiveness with result of 3/10 after pain medication.

## 2012-01-21 DIAGNOSIS — K592 Neurogenic bowel, not elsewhere classified: Secondary | ICD-10-CM

## 2012-01-21 DIAGNOSIS — N319 Neuromuscular dysfunction of bladder, unspecified: Secondary | ICD-10-CM

## 2012-01-21 DIAGNOSIS — M4712 Other spondylosis with myelopathy, cervical region: Secondary | ICD-10-CM

## 2012-01-21 DIAGNOSIS — Z5189 Encounter for other specified aftercare: Secondary | ICD-10-CM

## 2012-01-21 LAB — GLUCOSE, CAPILLARY
Glucose-Capillary: 159 mg/dL — ABNORMAL HIGH (ref 70–99)
Glucose-Capillary: 96 mg/dL (ref 70–99)
Glucose-Capillary: 120 mg/dL — ABNORMAL HIGH (ref 70–99)

## 2012-01-21 MED ORDER — METFORMIN HCL 500 MG PO TABS
500.0000 mg | ORAL_TABLET | Freq: Two times a day (BID) | ORAL | Status: DC
  Administered 2012-01-21 – 2012-01-28 (×14): 500 mg via ORAL
  Filled 2012-01-21 (×16): qty 1

## 2012-01-21 NOTE — Progress Notes (Signed)
Subjective/Complaints: Doing well. Was in ADL apt today. Other ROS reviewed and negative. Objective: Vital Signs: Blood pressure 107/70, pulse 68, temperature 98.8 F (37.1 C), temperature source Oral, resp. rate 18, height 6' (1.829 m), weight 78.064 kg (172 lb 1.6 oz), SpO2 97.00%. No results found. No results found for this basename: WBC:2,HGB:2,HCT:2,PLT:2 in the last 72 hours No results found for this basename: NA:2,K:2,CL:2,CO2:2,GLUCOSE:2,BUN:2,CREATININE:2,CALCIUM:2 in the last 72 hours CBG (last 3)   Basename 01/21/12 0436 01/21/12 0102 01/20/12 2136  GLUCAP 153* 159* 152*    Wt Readings from Last 3 Encounters:  01/19/12 78.064 kg (172 lb 1.6 oz)  01/12/12 82.6 kg (182 lb 1.6 oz)  01/12/12 82.6 kg (182 lb 1.6 oz)    Physical Exam:  General appearance: alert, cooperative, appears stated age and no distress Head: Normocephalic, without obvious abnormality, atraumatic Eyes: anicteric, non injected Ears: ext nl Nose: Nares normal. Septum midline. Mucosa normal. No drainage or sinus tenderness. Throat: lips, mucosa, and tongue normal; teeth and gums normal Neck: no adenopathy, no carotid bruit, no JVD, supple, symmetrical, trachea midline and thyroid not enlarged, symmetric, no tenderness/mass/nodules Back: symmetric, no curvature. ROM normal. No CVA tenderness. Resp: clear to auscultation bilaterally Cardio: regular rate and rhythm, S1, S2 normal, no murmur, click, rub or gallop GI: soft, non-tender; bowel sounds normal; no masses,  no organomegaly Extremities: extremities normal, atraumatic, no cyanosis or edema Pulses: 2+ and symmetric Skin: Skin color, texture, turgor normal. No rashes or lesions Neurologic: Left UE 1-2+ out of 5.  LLE 2+ to 3+/5 , RUE 4-/5, RLE 4/5, left heel cord a little tight. Incision/Wound:  Clean and intact   Assessment/Plan: 1. Functional deficits secondary to cervical stenosis with myelopathy and radiculopathy which require 3+ hours per day  of interdisciplinary therapy in a comprehensive inpatient rehab setting. Physiatrist is providing close team supervision and 24 hour management of active medical problems listed below. Physiatrist and rehab team continue to assess barriers to discharge/monitor patient progress toward functional and medical goals.   FIM: FIM - Bathing Bathing Steps Patient Completed: Chest;Left Arm;Front perineal area;Abdomen;Buttocks;Right upper leg;Left upper leg;Right lower leg (including foot);Left lower leg (including foot);Right Arm Bathing: 4: Steadying assist  FIM - Upper Body Dressing/Undressing Upper body dressing/undressing steps patient completed: Thread/unthread left sleeve of pullover shirt/dress;Thread/unthread right sleeve of pullover shirt/dresss;Pull shirt over trunk;Put head through opening of pull over shirt/dress Upper body dressing/undressing: 5: Supervision: Safety issues/verbal cues FIM - Lower Body Dressing/Undressing Lower body dressing/undressing steps patient completed: Pull pants up/down;Thread/unthread right pants leg;Thread/unthread left pants leg;Don/Doff left shoe;Don/Doff right shoe;Don/Doff right sock;Don/Doff left sock Lower body dressing/undressing: 4: Steadying Assist  FIM - Toileting Toileting steps completed by patient: Adjust clothing prior to toileting;Performs perineal hygiene;Adjust clothing after toileting Toileting Assistive Devices: Grab bar or rail for support Toileting: 0: Activity did not occur  FIM - Archivist Transfers: 4-To toilet/BSC: Min A (steadying Pt. > 75%);4-From toilet/BSC: Min A (steadying Pt. > 75%)  FIM - Bed/Chair Transfer Bed/Chair Transfer Assistive Devices: Therapist, occupational: 5: Supine > Sit: Supervision (verbal cues/safety issues);5: Bed > Chair or W/C: Supervision (verbal cues/safety issues)  FIM - Locomotion: Wheelchair Distance: 30' Locomotion: Wheelchair: 1: Total Assistance/staff pushes wheelchair  (Pt<25%) FIM - Locomotion: Ambulation Locomotion: Ambulation Assistive Devices: Walker - Rolling;Cane - Straight Ambulation/Gait Assistance: 4: Min guard Locomotion: Ambulation: 2: Travels 50 - 149 ft with minimal assistance (Pt.>75%)  Comprehension Comprehension Mode: Auditory Comprehension: 7-Follows complex conversation/direction: With no assist  Expression Expression Mode: Verbal Expression: 7-Expresses  complex ideas: With no assist  Social Interaction Social Interaction: 6-Interacts appropriately with others with medication or extra time (anti-anxiety, antidepressant).  Problem Solving Problem Solving: 7-Solves complex problems: Recognizes & self-corrects  Memory Memory: 7-Complete Independence: No helper  1. Cervical myelopathy. Status post C2-C7 laminectomy and fusion 01/04/2012 with postoperative epidural hematoma evacuation 01/09/2012  2. DVT Prophylaxis/Anticoagulation: SCDs. Monitor for any signs of DVT  3.. Pain Management: Decadron taper as advised, Robaxin and Percocet as needed. Monitor with increased mobility   -encouraged taking a percocet before he gets started each day so as not to let pain get out of control.  -voltaren gel 4. Neuropsych: This patient is capable of making decisions on his/her own behalf.  5. Hypertension. Presently on no antihypertensive medication. Patient on lisinopril 10 mg daily prior to admission. Will monitor with increased mobility  6. Diabetes mellitus.  resumed Glucotrol 5 mg twice a day as prior to admission. Patient also on Glucophage 1000 mg twice a day PTA---decrease to 500mg  bid for now   --off decadron now  7. Hyperlipidemia. Zocor  8. Neurogenic bowel: augmented bowel regimen senna S qhs. enema  LOS (Days) 9 A FACE TO FACE EVALUATION WAS PERFORMED  Weston Kallman T 01/21/2012, 8:31 AM

## 2012-01-21 NOTE — Plan of Care (Signed)
Problem: SCI BOWEL ELIMINATION Goal: RH STG MANAGE BOWEL WITH ASSISTANCE STG Manage Bowel with minimal assistance  Outcome: Not Progressing Last bowel movement 01-18-12 Goal: RH STG SCI MANAGE BOWEL WITH MEDICATION WITH ASSISTANCE STG SCI Manage bowel with medication supervision assistance.  Outcome: Not Progressing Last bowel movement 01-18-12 Goal: RH STG MANAGE BOWEL W/EQUIPMENT W/ASSISTANCE STG Manage Bowel With Equipment With Supervision Assistance  Last bowel movement 01-18-12

## 2012-01-21 NOTE — Progress Notes (Signed)
Per State Regulation 482.30 This chart was reviewed for medical necessity with respect to the patient's Admission/Duration of stay. Participating in therapies with steady progress. Nursing working on bowel program. Continent. Med adjustments. Meryl Dare                 Nurse Care Manager              Next Review Date: 01/24/12

## 2012-01-21 NOTE — Progress Notes (Signed)
Occupational Therapy Session Note  Patient Details  Name: ANTOINE VANDERMEULEN MRN: [id number] Date of Birth: July 28, 1945  Today's Date: 01/21/2012  Session 1 Time: 0700-0800 Time Calculation (min):  Short Term Goals: Week 2:   =LTG  Skilled Therapeutic Interventions/Progress Updates:    Pt engaged in bathing and dressing tasks in ADL apartment.  Pt used tub transfer bench placed in tub in bathroom using grab bars to assist when standing to wash buttocks.  Pt has grab bars in tub at home.  Pt amb to EOB in apartment to complete dressing tasks.  Therapist assisted with donning knee brace secondary to time constraints.  Focus on increased independence with bathing and dressing tasks and independence with managing supplies needed for bathing and dressing tasks.  Pt states he has things arranged at home so that they can be easily accessed once he is seated on bed for dressing.    Therapy Documentation Precautions:  Precautions Precautions: Fall Precaution Comments: Educated pt on cervical precautions. Required Braces or Orthoses: Cervical Brace Cervical Brace: Hard collar Restrictions Weight Bearing Restrictions: Yes Other Position/Activity Restrictions: patient has personal knee brace he likes to wear for comfort    See FIM for current functional status  Therapy/Group: Individual Therapy  Session 2 Time: 1330-1400 Pt denies pain Individual Therapy Pt engaged in weight bearing activities against wall to increase input through left UE.  Pt transitioned to left grasp and wrist activities.  Pt exhibiting increased finger flexion and wrist flexion with decreased muscle group recruitment.  Pt requires max verbal and tactile cues to isolate muscle groups for movements.     [name] [facility] 01/21/2012, 2:34 PM

## 2012-01-21 NOTE — Progress Notes (Addendum)
Physical Therapy Weekly Progress Note  Patient Details  Name: [name] MRN: [id number] Date of Birth: 29-Jul-1945  Today's Date: 01/21/2012 Time: 0900-0956 Time Calculation (min): 56 min  Patient has met 3 of 3 short term goals.    Patient continues to demonstrate the following deficits: Bil. LE weakness, increased need for assist/supervision, increased falls risk and therefore will continue to benefit from skilled PT intervention to enhance overall performance with activity tolerance, balance and ability to compensate for deficits.  Patient progressing toward long term goals..  Continue plan of care.  PT Short Term Goals Week 1:  PT Short Term Goal 1 (Week 1): Pt will ambulate 200' with min-guard assist PT Short Term Goal 1 - Progress (Week 1): Met PT Short Term Goal 2 (Week 1): Pt will maintain dynamic standing balance with out UE support with min assist x 2 min. PT Short Term Goal 2 - Progress (Week 1): Met PT Short Term Goal 3 (Week 1): Pt will transfer bed <> chair with min-guard assist PT Short Term Goal 3 - Progress (Week 1): Met  Skilled Therapeutic Interventions/Progress Updates:  Pain: 6/10 Rt. Knee, pt allowed rest as needed, had already received pain medication   Gait training with RW + Lt. Hand splint x 200' min-guard assist for tactile cues for Lt. Knee recurvatuum and Lt. Shoulder stability. Household environment gait with RW in bedroom (pt able to perform bed mobility at supervision level) and kitchen over carpeted and tile surfaces, min-guard assist. Practiced backwards and side stepping on carpet. Performed sit <> stand on variety height surfaces including couch and low chair without armrests (min assist needed for low chair). Gait training x 90' with single point cane with fatigue pt began to have significant loss of balance, reverted back to RW with splint and pt had much improved stability for a continued 100', min-guard assist.   Long term goals updated due to good  progress   Therapy Documentation Precautions:  Precautions Precautions: Fall Precaution Comments: Educated pt on cervical precautions. Required Braces or Orthoses: Cervical Brace Cervical Brace: Hard collar Restrictions Weight Bearing Restrictions: Yes Other Position/Activity Restrictions: patient has personal knee brace he likes to wear for comfort   See FIM for current functional status  Therapy/Group: Individual Therapy  Wilhemina Bonito 01/21/2012, 12:22 PM

## 2012-01-21 NOTE — Progress Notes (Signed)
Physical Therapy Session Note  Patient Details  Name: [name] MRN: [id number] Date of Birth: [birth year]  Today's Date: 01/21/2012 Time: 7829-5621 Time Calculation (min): 44 min  Short Term Goals: Week 2:  PT Short Term Goal 1 (Week 2): =LTG PT Short Term Goal 1 - Progress (Week 2): Progressing toward goal  Skilled Therapeutic Interventions/Progress Updates:    Gait in controlled environment x 150', 150', 60' with RW and Lt. Hand splint, min-guard assist for tactile cues for modulated Lt. Knee control, Lt. Scapular control. Clinical specialist observed gait to determine if AFO consult indicated, clinical specialist agreed that current heel lift is providing sufficient control. Pt also does not feel that he will wear the AFO if one is obtained for him anyway. Practiced mobilizing in handicap accessible public bathroom, min verbal cues and min-guard assist for safety and efficiency.    Therapy Documentation Precautions:  Precautions Precautions: Fall Precaution Comments: Educated pt on cervical precautions. Required Braces or Orthoses: Cervical Brace Cervical Brace: Hard collar Restrictions Weight Bearing Restrictions: Yes Other Position/Activity Restrictions: patient has personal knee brace he likes to wear for comfort Pain: Pain Assessment Pain Assessment: 0-10 Pain Score:   7 Pain Type: Chronic pain Pain Location: Knee Pain Orientation: Right Pain Descriptors: Sharp (catching) Pain Onset: With Activity Pain Intervention(s): Repositioned (allowed rest as needed)  See FIM for current functional status  Therapy/Group: Individual Therapy  Wilhemina Bonito 01/21/2012, 5:19 PM

## 2012-01-22 LAB — GLUCOSE, CAPILLARY
Glucose-Capillary: 146 mg/dL — ABNORMAL HIGH (ref 70–99)
Glucose-Capillary: 153 mg/dL — ABNORMAL HIGH (ref 70–99)
Glucose-Capillary: 170 mg/dL — ABNORMAL HIGH (ref 70–99)
Glucose-Capillary: 193 mg/dL — ABNORMAL HIGH (ref 70–99)

## 2012-01-22 MED ORDER — INSULIN ASPART 100 UNIT/ML ~~LOC~~ SOLN
0.0000 [IU] | Freq: Three times a day (TID) | SUBCUTANEOUS | Status: DC
  Administered 2012-01-22 – 2012-01-23 (×5): 3 [IU] via SUBCUTANEOUS
  Administered 2012-01-23: 2 [IU] via SUBCUTANEOUS
  Administered 2012-01-24 – 2012-01-25 (×7): 3 [IU] via SUBCUTANEOUS
  Administered 2012-01-26: 2 [IU] via SUBCUTANEOUS
  Administered 2012-01-26 (×2): 3 [IU] via SUBCUTANEOUS
  Administered 2012-01-27: 2 [IU] via SUBCUTANEOUS
  Administered 2012-01-27: 3 [IU] via SUBCUTANEOUS
  Administered 2012-01-27 – 2012-01-28 (×2): 2 [IU] via SUBCUTANEOUS

## 2012-01-22 NOTE — Progress Notes (Signed)
Patient ID: [name], male   DOB: [birth year], 66 y.o.   MRN: [id number] Subjective/Complaints: 7/6.  Doing well; no c/os.   Other ROS reviewed and negative. Exam- C collar in place; Chest- clear; CV- regular no tachy; Abd- benign  BP Readings from Last 3 Encounters:  01/22/12 117/70  01/12/12 107/67  01/12/12 107/67   CBG (last 3)   Basename 01/22/12 0451 01/22/12 0025 01/21/12 2052  GLUCAP 127* 146* 204*     Objective: Vital Signs: Blood pressure 117/70, pulse 62, temperature 97.9 F (36.6 C), temperature source Oral, resp. rate 18, height 6' (1.829 m), weight 78.064 kg (172 lb 1.6 oz), SpO2 96.00%. No results found. No results found for this basename: WBC:2,HGB:2,HCT:2,PLT:2 in the last 72 hours No results found for this basename: NA:2,K:2,CL:2,CO2:2,GLUCOSE:2,BUN:2,CREATININE:2,CALCIUM:2 in the last 72 hours CBG (last 3)   Basename 01/22/12 0451 01/22/12 0025 01/21/12 2052  GLUCAP 127* 146* 204*    Wt Readings from Last 3 Encounters:  01/19/12 78.064 kg (172 lb 1.6 oz)  01/12/12 82.6 kg (182 lb 1.6 oz)  01/12/12 82.6 kg (182 lb 1.6 oz)    Physical Exam:  General appearance: alert, cooperative, appears stated age and no distress Head: Normocephalic, without obvious abnormality, atraumatic Eyes: anicteric, non injected Ears: ext nl Nose: Nares normal. Septum midline. Mucosa normal. No drainage or sinus tenderness. Throat: lips, mucosa, and tongue normal; teeth and gums normal Neck: no adenopathy, no carotid bruit, no JVD, supple, symmetrical, trachea midline and thyroid not enlarged, symmetric, no tenderness/mass/nodules Back: symmetric, no curvature. ROM normal. No CVA tenderness. Resp: clear to auscultation bilaterally Cardio: regular rate and rhythm, S1, S2 normal, no murmur, click, rub or gallop GI: soft, non-tender; bowel sounds normal; no masses,  no organomegaly Extremities: extremities normal, atraumatic, no cyanosis or edema Pulses: 2+ and  symmetric Skin: Skin color, texture, turgor normal. No rashes or lesions Neurologic: Left UE 1-2+ out of 5.  LLE 2+ to 3+/5 , RUE 4-/5, RLE 4/5, left heel cord a little tight. Incision/Wound:  Clean and intact   Assessment/Plan: 1. Functional deficits secondary to cervical stenosis with myelopathy and radiculopathy which require 3+ hours per day of interdisciplinary therapy in a comprehensive inpatient rehab setting. Physiatrist is providing close team supervision and 24 hour management of active medical problems listed below. Physiatrist and rehab team continue to assess barriers to discharge/monitor patient progress toward functional and medical goals.   FIM: FIM - Bathing Bathing Steps Patient Completed: Chest;Left Arm;Front perineal area;Abdomen;Buttocks;Right upper leg;Left upper leg;Right lower leg (including foot);Left lower leg (including foot);Right Arm Bathing: 4: Steadying assist  FIM - Upper Body Dressing/Undressing Upper body dressing/undressing steps patient completed: Thread/unthread left sleeve of pullover shirt/dress;Thread/unthread right sleeve of pullover shirt/dresss;Pull shirt over trunk;Put head through opening of pull over shirt/dress Upper body dressing/undressing: 5: Supervision: Safety issues/verbal cues FIM - Lower Body Dressing/Undressing Lower body dressing/undressing steps patient completed: Pull pants up/down;Thread/unthread right pants leg;Thread/unthread left pants leg;Don/Doff left shoe;Don/Doff right shoe;Don/Doff right sock;Don/Doff left sock Lower body dressing/undressing: 4: Steadying Assist  FIM - Toileting Toileting steps completed by patient: Adjust clothing prior to toileting;Performs perineal hygiene;Adjust clothing after toileting Toileting Assistive Devices: Grab bar or rail for support Toileting: 0: Activity did not occur  FIM - Archivist Transfers: 4-To toilet/BSC: Min A (steadying Pt. > 75%);4-From toilet/BSC: Min A  (steadying Pt. > 75%)  FIM - Bed/Chair Transfer Bed/Chair Transfer Assistive Devices: Therapist, occupational: 5: Supine > Sit: Supervision (verbal cues/safety issues);5: Sit > Supine: Supervision (  verbal cues/safety issues);5: Bed > Chair or W/C: Supervision (verbal cues/safety issues);5: Chair or W/C > Bed: Supervision (verbal cues/safety issues)  FIM - Locomotion: Wheelchair Distance: 30' Locomotion: Wheelchair: 1: Total Assistance/staff pushes wheelchair (Pt<25%) FIM - Locomotion: Ambulation Locomotion: Ambulation Assistive Devices: Designer, industrial/product Ambulation/Gait Assistance: 4: Min guard Locomotion: Ambulation: 4: Travels 150 ft or more with minimal assistance (Pt.>75%)  Comprehension Comprehension Mode: Auditory Comprehension: 7-Follows complex conversation/direction: With no assist  Expression Expression Mode: Verbal Expression: 7-Expresses complex ideas: With no assist  Social Interaction Social Interaction: 6-Interacts appropriately with others with medication or extra time (anti-anxiety, antidepressant).  Problem Solving Problem Solving: 4-Solves basic 75 - 89% of the time/requires cueing 10 - 24% of the time  Memory Memory: 7-Complete Independence: No helper  1. Cervical myelopathy. Status post C2-C7 laminectomy and fusion 01/04/2012 with postoperative epidural hematoma evacuation 01/09/2012  2. DVT Prophylaxis/Anticoagulation: SCDs. Monitor for any signs of DVT  3.. Pain Management: Decadron taper as advised, Robaxin and Percocet as needed. Monitor with increased mobility   -encouraged taking a percocet before he gets started each day so as not to let pain get out of control.  -voltaren gel 4. Neuropsych: This patient is capable of making decisions on his/her own behalf.  5. Hypertension. Presently on no antihypertensive medication. Patient on lisinopril 10 mg daily prior to admission. Will monitor with increased mobility  6. Diabetes mellitus.  resumed Glucotrol  5 mg twice a day as prior to admission. Patient also on Glucophage 1000 mg twice a day PTA---decrease to 500mg  bid for now   --off decadron now  7. Hyperlipidemia. Zocor  8. Neurogenic bowel: augmented bowel regimen senna S qhs. enema  LOS (Days) 10 A FACE TO FACE EVALUATION WAS PERFORMED  Rogelia Boga 01/22/2012, 8:04 AM

## 2012-01-22 NOTE — Progress Notes (Signed)
Physical Therapy Note  Patient Details  Name: [name] MRN: [id number] Date of Birth: Jan 15, 1946 Today's Date: 01/22/2012  Time: 1400-1500 60 minutes  No c/o pain.  Pt participated in gait training group therapy with min A for gait with obstacle negotiation, sideways, backwards and gait with cognitive challenges with RW.  Min A for stair negotiation 5 stairs with B handrails.  Nustep 8 mins level 4 for LE/UE strength and endurance. Good safety awareness with RW with obstacle negotiation.  Group therapy   [name] 01/22/2012, 3:03 PM

## 2012-01-23 LAB — GLUCOSE, CAPILLARY: Glucose-Capillary: 180 mg/dL — ABNORMAL HIGH (ref 70–99)

## 2012-01-23 NOTE — Progress Notes (Signed)
Patient ID: [name], male   DOB: September 01, 1945, 66 y.o.   MRN: [id number] Patient ID: [name], male   DOB: [birth year], 66 y.o.   MRN: [id number] Subjective/Complaints: 7/7.  Doing well; no c/os.   Other ROS reviewed and negative. Exam- C collar in place; Chest- clear; CV- regular no tachy; Abd- benign  BP Readings from Last 3 Encounters:  01/23/12 103/62  01/12/12 107/67  01/12/12 107/67   CBG (last 3)   Basename 01/23/12 0748 01/22/12 2031 01/22/12 1655  GLUCAP 180* 193* 170*     Objective: Vital Signs: Blood pressure 103/62, pulse 73, temperature 98.6 F (37 C), temperature source Oral, resp. rate 18, height 6' (1.829 m), weight 78.064 kg (172 lb 1.6 oz), SpO2 96.00%. No results found. No results found for this basename: WBC:2,HGB:2,HCT:2,PLT:2 in the last 72 hours No results found for this basename: NA:2,K:2,CL:2,CO2:2,GLUCOSE:2,BUN:2,CREATININE:2,CALCIUM:2 in the last 72 hours CBG (last 3)   Basename 01/23/12 0748 01/22/12 2031 01/22/12 1655  GLUCAP 180* 193* 170*    Wt Readings from Last 3 Encounters:  01/19/12 78.064 kg (172 lb 1.6 oz)  01/12/12 82.6 kg (182 lb 1.6 oz)  01/12/12 82.6 kg (182 lb 1.6 oz)    Physical Exam:  General appearance: alert, cooperative, appears stated age and no distress Head: Normocephalic, without obvious abnormality, atraumatic Eyes: anicteric, non injected Ears: ext nl Nose: Nares normal. Septum midline. Mucosa normal. No drainage or sinus tenderness. Throat: lips, mucosa, and tongue normal; teeth and gums normal Neck: no adenopathy, no carotid bruit, no JVD, supple, symmetrical, trachea midline and thyroid not enlarged, symmetric, no tenderness/mass/nodules Back: symmetric, no curvature. ROM normal. No CVA tenderness. Resp: clear to auscultation bilaterally Cardio: regular rate and rhythm, S1, S2 normal, no murmur, click, rub or gallop GI: soft, non-tender; bowel sounds normal; no masses,  no organomegaly Extremities:  extremities normal, atraumatic, no cyanosis or edema Pulses: 2+ and symmetric Skin: Skin color, texture, turgor normal. No rashes or lesions Neurologic: Left UE 1-2+ out of 5.  LLE 2+ to 3+/5 , RUE 4-/5, RLE 4/5, left heel cord a little tight. Incision/Wound:  Clean and intact   Assessment/Plan: 1. Functional deficits secondary to cervical stenosis with myelopathy and radiculopathy which require 3+ hours per day of interdisciplinary therapy in a comprehensive inpatient rehab setting. Physiatrist is providing close team supervision and 24 hour management of active medical problems listed below. Physiatrist and rehab team continue to assess barriers to discharge/monitor patient progress toward functional and medical goals.   FIM: FIM - Bathing Bathing Steps Patient Completed: Chest;Right Arm;Left Arm;Abdomen;Front perineal area;Buttocks;Right upper leg;Left upper leg;Right lower leg (including foot);Left lower leg (including foot) Bathing: 4: Steadying assist  FIM - Upper Body Dressing/Undressing Upper body dressing/undressing steps patient completed: Thread/unthread right sleeve of pullover shirt/dresss;Thread/unthread left sleeve of pullover shirt/dress;Pull shirt over trunk Upper body dressing/undressing: 4: Min-Patient completed 75 plus % of tasks FIM - Lower Body Dressing/Undressing Lower body dressing/undressing steps patient completed: Thread/unthread right pants leg;Thread/unthread left pants leg;Pull pants up/down Lower body dressing/undressing: 3: Mod-Patient completed 50-74% of tasks  FIM - Toileting Toileting steps completed by patient: Adjust clothing prior to toileting;Performs perineal hygiene;Adjust clothing after toileting Toileting Assistive Devices: Grab bar or rail for support Toileting: 6: More than reasonable amount of time  FIM - Diplomatic Services operational officer Devices: Grab bars Toilet Transfers: 5-To toilet/BSC: Supervision (verbal cues/safety  issues);5-From toilet/BSC: Supervision (verbal cues/safety issues)  FIM - Banker Devices: Manufacturing systems engineer Transfer: 5:  Bed > Chair or W/C: Supervision (verbal cues/safety issues);5: Chair or W/C > Bed: Supervision (verbal cues/safety issues)  FIM - Locomotion: Wheelchair Distance: 30' Locomotion: Wheelchair: 1: Total Assistance/staff pushes wheelchair (Pt<25%) FIM - Locomotion: Ambulation Locomotion: Ambulation Assistive Devices: Designer, industrial/product Ambulation/Gait Assistance: 4: Min guard Locomotion: Ambulation: 4: Travels 150 ft or more with minimal assistance (Pt.>75%)  Comprehension Comprehension Mode: Auditory Comprehension: 7-Follows complex conversation/direction: With no assist  Expression Expression Mode: Verbal Expression: 7-Expresses complex ideas: With no assist  Social Interaction Social Interaction: 6-Interacts appropriately with others with medication or extra time (anti-anxiety, antidepressant).  Problem Solving Problem Solving: 5-Solves basic 90% of the time/requires cueing < 10% of the time  Memory Memory: 7-Complete Independence: No helper  1. Cervical myelopathy. Status post C2-C7 laminectomy and fusion 01/04/2012 with postoperative epidural hematoma evacuation 01/09/2012  2. DVT Prophylaxis/Anticoagulation: SCDs. Monitor for any signs of DVT  3.. Pain Management: Decadron taper as advised, Robaxin and Percocet as needed. Monitor with increased mobility   -encouraged taking a percocet before he gets started each day so as not to let pain get out of control.  -voltaren gel 4. Neuropsych: This patient is capable of making decisions on his/her own behalf.  5. Hypertension. Presently on no antihypertensive medication. Patient on lisinopril 10 mg daily prior to admission. Will monitor with increased mobility  6. Diabetes mellitus.  resumed Glucotrol 5 mg twice a day as prior to admission. Patient also on Glucophage 1000 mg  twice a day PTA---decrease to 500mg  bid for now   --off decadron now  7. Hyperlipidemia. Zocor  8. Neurogenic bowel: augmented bowel regimen senna S qhs. enema  LOS (Days) 11 A FACE TO FACE EVALUATION WAS PERFORMED  Rogelia Boga 01/23/2012, 8:15 AM

## 2012-01-23 NOTE — Progress Notes (Signed)
Physical Therapy Note  Patient Details  Name: [name] MRN: [id number] Date of Birth: 09/25/1945 Today's Date: 01/23/2012  1300-1355 (55 minutes) group Pain: no complaint of pain Pt participated in PT group session focused on gait training/safety/endurance. Pt ambulates with RW + hand orthosis on left 80 feet X 2 SBA. Kinetron in standing 2 X 10 reps for LT knee terminal extension control (with assist to prevent hyperextension) ; up/down 4 inch step LT LE only 2 X 10 for quad strengthening at end of functional range.   Patches Mcdonnell,JIM 01/23/2012, 2:51 PM

## 2012-01-24 LAB — GLUCOSE, CAPILLARY
Glucose-Capillary: 192 mg/dL — ABNORMAL HIGH (ref 70–99)
Glucose-Capillary: 104 mg/dL — ABNORMAL HIGH (ref 70–99)
Glucose-Capillary: 162 mg/dL — ABNORMAL HIGH (ref 70–99)
Glucose-Capillary: 198 mg/dL — ABNORMAL HIGH (ref 70–99)

## 2012-01-24 MED ORDER — SENNOSIDES-DOCUSATE SODIUM 8.6-50 MG PO TABS
3.0000 | ORAL_TABLET | Freq: Every day | ORAL | Status: DC
  Administered 2012-01-24 – 2012-01-27 (×4): 3 via ORAL
  Filled 2012-01-24 (×4): qty 3

## 2012-01-24 MED ORDER — POLYETHYLENE GLYCOL 3350 17 G PO PACK
17.0000 g | PACK | Freq: Every day | ORAL | Status: DC | PRN
  Filled 2012-01-24: qty 1

## 2012-01-24 MED ORDER — POLYETHYLENE GLYCOL 3350 17 G PO PACK
17.0000 g | PACK | ORAL | Status: AC
  Administered 2012-01-24: 17 g via ORAL
  Filled 2012-01-24: qty 1

## 2012-01-24 NOTE — Progress Notes (Signed)
Social Work Patient ID: [name], male   DOB: 1946/06/23, 66 y.o.   MRN: [id number] Micah Flesher to see pt in therapies but son not with him.  Pt reports he had to leave, so will not be attending  Therapies today with him. Hopefully will sometime this week.  Pt reports he is aware he shouldn't walk without Assistance.  Work toward discharge Friday.

## 2012-01-24 NOTE — Progress Notes (Signed)
Per State Regulation 482.30 This chart was reviewed for medical necessity with respect to the patient's Admission/Duration of stay. Pt participating in therapies with slow, steady progress. Pt's son in for education today in preparation for d/c Friday. Meryl Dare                 Nurse Care Manager            Next Review Date: 01/27/12

## 2012-01-24 NOTE — Progress Notes (Signed)
Occupational Therapy Session Note  Patient Details  Name: [name] MRN: [id number] Date of Birth: 1946-02-20  Today's Date: 01/24/2012  Session 1 Time: 0700-0800 Time Calculation (min): 60 min  Short Term Goals: Week 2:   =LTG  Skilled Therapeutic Interventions/Progress Updates:    ADL retraining including bathing at shower level and dressing with sit<>stand from EOB.  Pt supervision for all tasks.  Pt amb to bathroom with RW.  Focus on safety awareness, standing balance, and compensatory strategies for dressing tasks.    Therapy Documentation Precautions:  Precautions Precautions: Fall Precaution Comments: Educated pt on cervical precautions. Required Braces or Orthoses: Cervical Brace Cervical Brace: Hard collar Restrictions Weight Bearing Restrictions: No Other Position/Activity Restrictions: patient has personal knee brace he likes to wear for comfort  Pain: Pain Assessment Pain Assessment: 0-10 Pain Score:   5 Pain Type: Surgical pain Pain Location: Neck Pain Orientation: Posterior Pain Descriptors: Aching Pain Intervention(s): RN made aware  See FIM for current functional status  Therapy/Group: Individual Therapy  Session 2 Time: 1330-1400 Pt denies pain Individual Therapy Pt engaged in kitchen activities with emphasis on compensatory strategies and safety awareness; no unsafe behaviors noted.  Pt practiced doffing/donning right knee brace - mod I.  Pt transitioned to completing home management tasks using RW for functional amb.    [name] [facility] 01/24/2012, 8:01 AM

## 2012-01-24 NOTE — Progress Notes (Signed)
Physical Therapy Session Note  Patient Details  Name: JESTIN BURBACH MRN: [id number] Date of Birth: [birth year]  Today's Date: 01/24/2012 Time: 0831-0928 Time Calculation (min): 57 min  Short Term Goals: Week 2:  PT Short Term Goal 1 (Week 2): =LTG PT Short Term Goal 1 - Progress (Week 2): Progressing toward goal  Skilled Therapeutic Interventions/Progress Updates:    Gait training x 150' with RW and Lt. Hand splint verbal/tactile cues to decrease LT. Knee hyperextension. Pt with decreased control of Lt. Knee, gains lost over weekend, donned knee cage, gait training x 75', 150' with RW/hand splint with improved mechanics. Supine hip stabilization exercises: bridging + resisted bil. Hip abduction; repeated resisted bil. Hip abduction with 5 sec hold at end range 3 x 15 reps. Resisted Lt. Shoulder external rotation + bil. Scapular retraction for shoulder stability 2 x 12 reps.   All resisted exercise performed with orange band. Knee cage left in room for further therapies, may benefit from knee cage at home for long distances.   Therapy Documentation Precautions:  Precautions Precautions: Fall Precaution Comments: Educated pt on cervical precautions. Required Braces or Orthoses: Cervical Brace Cervical Brace: Hard collar Restrictions Weight Bearing Restrictions: No Other Position/Activity Restrictions: patient has personal knee brace he likes to wear for comfort Pain: Pain Assessment Pain Assessment: No/denies pain Pain Score: 0-No pain   Therapy/Group: Individual Therapy  Wilhemina Bonito 01/24/2012, 12:48 PM

## 2012-01-24 NOTE — Progress Notes (Signed)
Subjective/Complaints: Thinks that left hand is a little number than it has been. Complains of constipation   Other ROS reviewed and negative. Exam- C collar in place; Chest- clear; CV- regular no tachy; Abd- benign    Objective: Vital Signs: Blood pressure 119/60, pulse 56, temperature 97.8 F (36.6 C), temperature source Oral, resp. rate 18, height 6' (1.829 m), weight 78.064 kg (172 lb 1.6 oz), SpO2 96.00%. No results found. No results found for this basename: WBC:2,HGB:2,HCT:2,PLT:2 in the last 72 hours No results found for this basename: NA:2,K:2,CL:2,CO2:2,GLUCOSE:2,BUN:2,CREATININE:2,CALCIUM:2 in the last 72 hours CBG (last 3)   Basename 01/23/12 2031 01/23/12 1641 01/23/12 1132  GLUCAP 166* 169* 121*    Wt Readings from Last 3 Encounters:  01/19/12 78.064 kg (172 lb 1.6 oz)  01/12/12 82.6 kg (182 lb 1.6 oz)  01/12/12 82.6 kg (182 lb 1.6 oz)    Physical Exam:  General appearance: alert, cooperative, appears stated age and no distress Head: Normocephalic, without obvious abnormality, atraumatic Eyes: anicteric, non injected Ears: ext nl Nose: Nares normal. Septum midline. Mucosa normal. No drainage or sinus tenderness. Throat: lips, mucosa, and tongue normal; teeth and gums normal Neck: no adenopathy, no carotid bruit, no JVD, supple, symmetrical, trachea midline and thyroid not enlarged, symmetric, no tenderness/mass/nodules Back: symmetric, no curvature. ROM normal. No CVA tenderness. Resp: clear to auscultation bilaterally Cardio: regular rate and rhythm, S1, S2 normal, no murmur, click, rub or gallop GI: soft, non-tender; bowel sounds normal; no masses,  no organomegaly Extremities: extremities normal, atraumatic, no cyanosis or edema Pulses: 2+ and symmetric Skin: Skin color, texture, turgor normal. No rashes or lesions Neurologic: Left UE 2+ to 3 at biceps and shoulder. Tric 1+, we trace as are HI.  LLE 2+ to 3+/5 , RUE 4-/5, RLE 4/5, left heel cord a little  tight. Incision/Wound:  Clean and intact   Assessment/Plan: 1. Functional deficits secondary to cervical stenosis with myelopathy and radiculopathy which require 3+ hours per day of interdisciplinary therapy in a comprehensive inpatient rehab setting. Physiatrist is providing close team supervision and 24 hour management of active medical problems listed below. Physiatrist and rehab team continue to assess barriers to discharge/monitor patient progress toward functional and medical goals.   FIM: FIM - Bathing Bathing Steps Patient Completed: Chest;Right Arm;Left Arm;Abdomen;Front perineal area;Right upper leg;Left upper leg Bathing: 5: Set-up assist to: Obtain items  FIM - Upper Body Dressing/Undressing Upper body dressing/undressing steps patient completed: Thread/unthread right sleeve of pullover shirt/dresss;Thread/unthread left sleeve of pullover shirt/dress;Put head through opening of pull over shirt/dress;Pull shirt over trunk Upper body dressing/undressing: 5: Supervision: Safety issues/verbal cues FIM - Lower Body Dressing/Undressing Lower body dressing/undressing steps patient completed: Thread/unthread right pants leg;Thread/unthread left pants leg;Pull pants up/down Lower body dressing/undressing: 4: Min-Patient completed 75 plus % of tasks  FIM - Toileting Toileting steps completed by patient: Adjust clothing prior to toileting;Performs perineal hygiene;Adjust clothing after toileting Toileting Assistive Devices: Grab bar or rail for support Toileting: 6: More than reasonable amount of time  FIM - Diplomatic Services operational officer Devices: Grab bars Toilet Transfers: 5-To toilet/BSC: Supervision (verbal cues/safety issues);5-From toilet/BSC: Supervision (verbal cues/safety issues)  FIM - Banker Devices: Walker;Bed rails Bed/Chair Transfer: 5: Bed > Chair or W/C: Supervision (verbal cues/safety issues);5: Chair or W/C >  Bed: Supervision (verbal cues/safety issues)  FIM - Locomotion: Wheelchair Distance: 30' Locomotion: Wheelchair: 1: Total Assistance/staff pushes wheelchair (Pt<25%) FIM - Locomotion: Ambulation Locomotion: Ambulation Assistive Devices: Designer, industrial/product Ambulation/Gait Assistance: 4:  Min guard Locomotion: Ambulation: 4: Travels 150 ft or more with minimal assistance (Pt.>75%)  Comprehension Comprehension Mode: Auditory Comprehension: 7-Follows complex conversation/direction: With no assist  Expression Expression Mode: Verbal Expression: 7-Expresses complex ideas: With no assist  Social Interaction Social Interaction: 7-Interacts appropriately with others - No medications needed.  Problem Solving Problem Solving: 5-Solves complex 90% of the time/cues < 10% of the time  Memory Memory: 7-Complete Independence: No helper  1. Cervical myelopathy. Status post C2-C7 laminectomy and fusion 01/04/2012 with postoperative epidural hematoma evacuation 01/09/2012   -i don't see any changes in neuro exam. May have compresses a peripehral nerve while he was asleep.  -follow clinically for now. 2. DVT Prophylaxis/Anticoagulation: SCDs. Monitor for any signs of DVT  3.. Pain Management: Decadron taper as advised, Robaxin and Percocet as needed. Monitor with increased mobility   -encouraged taking a percocet before he gets started each day so as not to let pain get out of control.  -voltaren gel 4. Neuropsych: This patient is capable of making decisions on his/her own behalf.  5. Hypertension. Presently on no antihypertensive medication. Patient on lisinopril 10 mg daily prior to admission. Will monitor with increased mobility  6. Diabetes mellitus.  resumed Glucotrol 5 mg twice a day as prior to admission. Patient also on Glucophage 1000 mg twice a day PTA---decrease to 500mg  bid for now   --off decadron now  7. Hyperlipidemia. Zocor  8. Neurogenic bowel: augmented bowel regimen senna S qhs.  Will try laxative today.  LOS (Days) 12 A FACE TO FACE EVALUATION WAS PERFORMED  Persis Graffius T 01/24/2012, 7:50 AM

## 2012-01-24 NOTE — Progress Notes (Signed)
Social Work Patient ID: [name], male   DOB: 1946/01/08, 66 y.o.   MRN: [id number] Met with pt and son-[name] to discuss discharge plans.  Discussed team's recommendation is 24 hour supervision at discharge. Son reports he will be alone some of the time.  Pt states: " I will be fine, I don't do the things exactly the way the therapists want Me too.  I am having to re-learn how not to hyperextend my knee when I walk.  Encouraged son to stay and attend therapies with Pt this afternoon.  He plans too then he can ask questions to the therapists.  Discussed home health coverage and also talked about SNF's.  Pt feels he will be ready to go home by Friday.

## 2012-01-24 NOTE — Progress Notes (Signed)
Physical Therapy Session Note  Patient Details  Name: [name] MRN: [id number] Date of Birth: 1945-10-07  Today's Date: 01/24/2012 Time: 1430-1510 Time Calculation (min): 40 min  Short Term Goals: Week 2:  PT Short Term Goal 1 (Week 2): =LTG PT Short Term Goal 1 - Progress (Week 2): Progressing toward goal  Skilled Therapeutic Interventions/Progress Updates:    Gait training x 150', 150' with RW and Lt. Hand splint and Lt. Knee cage. Pt with improving smoothness of gait, improving stability. Step up 6" step with Lt. LE for strengthening 3 x 10 reps, Rt. LE foot taps for Lt. LE single limb stance, verbal cues for controling Lt. Knee however knee cage donned entire session. Dynamic balance standing on compliant surface + ball toss with cognitive challenge, min assist for loss of balance. Performed without UE support. Discussed knee cage, pt open to ordering this for longer distance ambulation to protect integrity of Lt. Knee joint.   Therapy Documentation Precautions:  Precautions Precautions: Fall Precaution Comments: Educated pt on cervical precautions. Required Braces or Orthoses: Cervical Brace Cervical Brace: Hard collar Restrictions Weight Bearing Restrictions: No Other Position/Activity Restrictions: patient has personal knee brace he likes to wear for comfort Pain: Pain Assessment Pain Assessment: 0-10 Pain Score:   7 Pain Type: Chronic pain Pain Location: Knee Pain Orientation: Right Pain Descriptors:  (crunch/catch) Pain Onset: With Activity Pain Intervention(s): Repositioned;Rest  See FIM for current functional status  Therapy/Group: Individual Therapy  Wilhemina Bonito 01/24/2012, 4:43 PM

## 2012-01-24 NOTE — Progress Notes (Signed)
Remains continent of bowel and bladder. Last bowel movement 01/23/12. Independent with the urinal. Requires staff to remove and empty. Posterior cervical incision with staples and intermittent steristrips. Voltaren gel to R knee with knee immobilizer in place. Requests Percocet x 2 tab q 4hrs for R knee discomfort. Calls appropriately for assistance.

## 2012-01-25 LAB — GLUCOSE, CAPILLARY: Glucose-Capillary: 154 mg/dL — ABNORMAL HIGH (ref 70–99)

## 2012-01-25 MED ORDER — SORBITOL 70 % SOLN
60.0000 mL | Status: AC
  Administered 2012-01-25: 60 mL via ORAL
  Filled 2012-01-25: qty 30

## 2012-01-25 NOTE — Progress Notes (Signed)
Occupational Therapy Session Note  Patient Details  Name: [name] MRN: [id number] Date of Birth: Feb 15, 1946  Today's Date: 01/25/2012  Session 1 Time: 0700-0800 Time Calculation (min): 60 min  Short Term Goals: Week 2:   =LTG  Skilled Therapeutic Interventions/Progress Updates:    ADL retraining including bathing at shower level and dressing from EOB.  Emphasis on increased independence with all ADLs.  Pt amb to bathroom with RW at supervision level.  Pt completed all bathing tasks with supervision using AE PRN.  Pt completed dressing tasks with sit to stand from EOB.  Pt transitioned to functional amb in room for home mgmt tasks at supervision level.  Therapy Documentation Precautions:  Precautions Precautions: Fall Precaution Comments: Educated pt on cervical precautions. Required Braces or Orthoses: Cervical Brace Cervical Brace: Hard collar Restrictions Weight Bearing Restrictions: No Other Position/Activity Restrictions: patient has personal knee brace he likes to wear for comfort Pain: Pain Assessment Pain Assessment: 0-10 Pain Score:   4 Pain Type: Surgical pain Pain Location: Neck Pain Orientation: Posterior Pain Descriptors: Aching Pain Frequency: Intermittent Pain Onset: Gradual Patients Stated Pain Goal: 2 Pain Intervention(s): RN made aware    See FIM for current functional status  Therapy/Group: Individual Therapy  Session 2 Time: 1400-1430 Pt denies pain Individual Therapy Pt practiced fastening belt using BUE with no assistance.  Pt amb with RW to bathroom to use toilet at supervision level.  Pt transitioned to left UE exercises (wrist flexion/extension, finger flexion/extension, and elbow extension.  Pt exhibiting increased muscle group isolation with slight increased movement.   [name] [facility] 01/25/2012, 9:11 AM

## 2012-01-25 NOTE — Progress Notes (Signed)
Recreational Therapy Session Note  Patient Details  Name: [name] MRN: [id number] Date of Birth: 1945/07/29 Today's Date: 01/25/2012 Time: 9-930 Pain: no c/o Skilled Therapeutic Interventions/Progress Updates: Out and about on hospital grounds ambulating with RW and wearing knee cage on outdoor uneven surfaces looking at items at [employer].   Pt demonstrates good safety with mobility.  Pt ambulated with contact guard assist.   Racquel Arkin 01/25/2012, 11:07 AM

## 2012-01-25 NOTE — Progress Notes (Signed)
Physical Therapy Session Note  Patient Details  Name: [name] MRN: [id number] Date of Birth: 05-31-1946  Today's Date: 01/25/2012 Time: 3086-5784 Time Calculation (min): 61 min  Short Term Goals: STG=LTG  Skilled Therapeutic Interventions/Progress Updates:  Pain: 6/10 Rt. Knee "catches" at times during gait. Pt allowed rest until pain resolved.   Gait over controlled surface x 200' with RW and Lt. Hand splint with supervision, Lt. Knee cage donned. Obstacle course negotiating cones and stepping over varying width obstacles performed with supervision. Gait over simulated clumpy grass surface with RW, splint performed with min-guard assist. Outdoor ambulation with RW, Lt. Hand splint over uneven pavement, up/down inclined surfaces while performing cognitive task with recreational therapist, performed with min-guard assist.  Updated goals due to good progress.  Second Session Skilled Therapeutic Interventions/Progress Updates:  Time:  [phone] Time Calculation (min): 34 min  *Pt missed 10 min due to having loose bowel movement with nursing at start, pt requested hold PT until finished. Pain: 0/10 Pt declining out of room therapy due to fear of needing the bathroom quickly. Requested focus on seated strength training to decrease risk of bowel accident. Performed seated resisted UE rows (Rt. UE with orange band, Lt. UE manually resisted with facilitation of scapular retractors and depressors.) Practiced grasping and opening Lt. Hand with facilitation of finger flexors/extensors respectively. Bil. long arc quads with manual stretch into dorsiflexion at end Lt. Knee extension. Bil. Dorsiflexion/plantar flexion. Resisted knee flexion with orange band. All exercises 3 x 10-12 reps Transfer wheelchair > recliner with RW performed with supervision.    Therapy Documentation Precautions:  Precautions Precautions: Fall Precaution Comments: Educated pt on cervical precautions. Required Braces  or Orthoses: Cervical Brace Cervical Brace: Hard collar Restrictions Weight Bearing Restrictions: No Other Position/Activity Restrictions: patient has personal knee brace he likes to wear for comfort  See FIM for current functional status  Therapy/Group: Individual Therapy both sessions  Wilhemina Bonito 01/25/2012, 2:44 PM

## 2012-01-25 NOTE — Progress Notes (Signed)
Orthopedic Tech Progress Note Patient Details:  DARIUSZ BRASE [birth year] [id number]  Ortho Devices Type of Ortho Device: Postop boot   Cammer, Mickie Bail 01/25/2012, 4:25 PM Called [facility]

## 2012-01-25 NOTE — Progress Notes (Signed)
Remains continent of bowel and bladder. Last bowel movement x 3 after Sorbitol 60mg  x 1 dose.  Extra large, soft stool per report. Pain managed with 2 Percocet q 4hrs, prn and Voltaren cream to R knee.

## 2012-01-25 NOTE — Progress Notes (Signed)
Patient ID: [name], male   DOB: May 24, 1946, 66 y.o.   MRN: [id number] Subjective/Complaints: Still no bm yesterday. In shower with OT this am. Other ROS reviewed and negative. Exam- C collar in place; Chest- clear; CV- regular no tachy; Abd- benign    Objective: Vital Signs: Blood pressure 121/66, pulse 72, temperature 98.2 F (36.8 C), temperature source Oral, resp. rate 19, height 6' (1.829 m), weight 78.064 kg (172 lb 1.6 oz), SpO2 98.00%. No results found. No results found for this basename: WBC:2,HGB:2,HCT:2,PLT:2 in the last 72 hours No results found for this basename: NA:2,K:2,CL:2,CO2:2,GLUCOSE:2,BUN:2,CREATININE:2,CALCIUM:2 in the last 72 hours CBG (last 3)   Basename 01/24/12 2109 01/24/12 1630 01/24/12 1128  GLUCAP 198* 162* 104*    Wt Readings from Last 3 Encounters:  01/19/12 78.064 kg (172 lb 1.6 oz)  01/12/12 82.6 kg (182 lb 1.6 oz)  01/12/12 82.6 kg (182 lb 1.6 oz)    Physical Exam:  General appearance: alert, cooperative, appears stated age and no distress Head: Normocephalic, without obvious abnormality, atraumatic Eyes: anicteric, non injected Ears: ext nl Nose: Nares normal. Septum midline. Mucosa normal. No drainage or sinus tenderness. Throat: lips, mucosa, and tongue normal; teeth and gums normal Neck: no adenopathy, no carotid bruit, no JVD, supple, symmetrical, trachea midline and thyroid not enlarged, symmetric, no tenderness/mass/nodules Back: symmetric, no curvature. ROM normal. No CVA tenderness. Resp: clear to auscultation bilaterally Cardio: regular rate and rhythm, S1, S2 normal, no murmur, click, rub or gallop GI: soft, non-tender; bowel sounds normal; no masses,  no organomegaly Extremities: extremities normal, atraumatic, no cyanosis or edema Pulses: 2+ and symmetric Skin: Skin color, texture, turgor normal. No rashes or lesions Neurologic: Left UE 2+ to 3 at biceps and shoulder. Tric 1+, we trace as are HI.  LLE 2+ to 3+/5 , RUE 4-/5,  RLE 4/5, left heel cord a little tight. Incision/Wound:  Clean and intact   Assessment/Plan: 1. Functional deficits secondary to cervical stenosis with myelopathy and radiculopathy which require 3+ hours per day of interdisciplinary therapy in a comprehensive inpatient rehab setting. Physiatrist is providing close team supervision and 24 hour management of active medical problems listed below. Physiatrist and rehab team continue to assess barriers to discharge/monitor patient progress toward functional and medical goals.   FIM: FIM - Bathing Bathing Steps Patient Completed: Chest;Right Arm;Left Arm;Abdomen;Front perineal area;Right upper leg;Left upper leg;Right lower leg (including foot);Left lower leg (including foot) Bathing: 5: Supervision: Safety issues/verbal cues  FIM - Upper Body Dressing/Undressing Upper body dressing/undressing steps patient completed: Thread/unthread right sleeve of pullover shirt/dresss;Thread/unthread left sleeve of pullover shirt/dress;Put head through opening of pull over shirt/dress;Pull shirt over trunk Upper body dressing/undressing: 5: Supervision: Safety issues/verbal cues FIM - Lower Body Dressing/Undressing Lower body dressing/undressing steps patient completed: Thread/unthread right pants leg;Thread/unthread left pants leg;Pull pants up/down;Don/Doff right sock;Don/Doff left sock;Don/Doff right shoe;Don/Doff left shoe Lower body dressing/undressing: 4: Min-Patient completed 75 plus % of tasks  FIM - Toileting Toileting steps completed by patient: Adjust clothing prior to toileting;Performs perineal hygiene;Adjust clothing after toileting Toileting Assistive Devices: Grab bar or rail for support Toileting: 5: Supervision: Safety issues/verbal cues  FIM - Diplomatic Services operational officer Devices: Grab bars Toilet Transfers: 5-To toilet/BSC: Supervision (verbal cues/safety issues);5-From toilet/BSC: Supervision (verbal cues/safety  issues)  FIM - Banker Devices: Walker;Bed rails Bed/Chair Transfer: 5: Bed > Chair or W/C: Supervision (verbal cues/safety issues);5: Supine > Sit: Supervision (verbal cues/safety issues);5: Chair or W/C > Bed: Supervision (verbal cues/safety issues)  FIM - Locomotion: Wheelchair Distance: 30' Locomotion: Wheelchair: 0: Activity did not occur (NA) FIM - Locomotion: Ambulation Locomotion: Ambulation Assistive Devices: Walker - Rolling;Other (comment) (Lt. hand splint) Ambulation/Gait Assistance: 5: Supervision Locomotion: Ambulation: 5: Travels 150 ft or more with supervision/safety issues  Comprehension Comprehension Mode: Auditory Comprehension: 7-Follows complex conversation/direction: With no assist  Expression Expression Mode: Verbal Expression: 7-Expresses complex ideas: With no assist  Social Interaction Social Interaction: 7-Interacts appropriately with others - No medications needed.  Problem Solving Problem Solving: 5-Solves complex 90% of the time/cues < 10% of the time  Memory Memory: 7-Complete Independence: No helper  1. Cervical myelopathy. Status post C2-C7 laminectomy and fusion 01/04/2012 with postoperative epidural hematoma evacuation 01/09/2012   -neuro exam stable  -follow clinically for now. 2. DVT Prophylaxis/Anticoagulation: SCDs. Monitor for any signs of DVT  3.. Pain Management: Decadron taper as advised, Robaxin and Percocet as needed. Monitor with increased mobility   -encouraged taking a percocet before he gets started each day so as not to let pain get out of control.  -voltaren gel 4. Neuropsych: This patient is capable of making decisions on his/her own behalf.  5. Hypertension. Presently on no antihypertensive medication. Patient on lisinopril 10 mg daily prior to admission. Will monitor with increased mobility  6. Diabetes mellitus.  resumed Glucotrol 5 mg twice a day as prior to admission. Patient also  on Glucophage 1000 mg twice a day PTA---decrease to 500mg  bid for now   --off decadron now  7. Hyperlipidemia. Zocor  8. Neurogenic bowel: augmented bowel regimen senna S qhs. Try sorbitol today followed by SSE  LOS (Days) 13 A FACE TO FACE EVALUATION WAS PERFORMED  Shyniece Scripter T 01/25/2012, 7:19 AM

## 2012-01-26 DIAGNOSIS — K592 Neurogenic bowel, not elsewhere classified: Secondary | ICD-10-CM

## 2012-01-26 DIAGNOSIS — N319 Neuromuscular dysfunction of bladder, unspecified: Secondary | ICD-10-CM

## 2012-01-26 DIAGNOSIS — M4712 Other spondylosis with myelopathy, cervical region: Secondary | ICD-10-CM

## 2012-01-26 DIAGNOSIS — Z5189 Encounter for other specified aftercare: Secondary | ICD-10-CM

## 2012-01-26 LAB — GLUCOSE, CAPILLARY
Glucose-Capillary: 81 mg/dL (ref 70–99)
Glucose-Capillary: 149 mg/dL — ABNORMAL HIGH (ref 70–99)
Glucose-Capillary: 163 mg/dL — ABNORMAL HIGH (ref 70–99)

## 2012-01-26 NOTE — Progress Notes (Signed)
Physical Therapy Note  Patient Details  Name: ARLO BUTT MRN: [id number] Date of Birth: October 18, 1945 Today's Date: 01/26/2012  6962-9528 (60 minutes) individual Pain: no complaint of pain Focus of treatment: Neuro re-ed LT LE; gait training RW with recurvatum brace Treatment: Transfers SPT RW + hand orthosis LT SBA wc>mat; LT LE neuro re-ed in supine X 20 (heel slides, ankle pumps, hip abduction) ; Gait training: 80 feet X 3 RW as above SBA  With LT recurvatum brace decreasing hyperextension. Pt instructed in increased hip flexion during swing to decrease minimal foot drag on left (decreased eversion control).   Latavius Capizzi,JIM 01/26/2012, 11:02 AM

## 2012-01-26 NOTE — Progress Notes (Signed)
Orthopedic Tech Progress Note Patient Details:  [name] [birth year] [id number]  Patient ID: [name], male   DOB: March 23, 1946, 66 y.o.   MRN: [id number] Confirmed brace has been delivered to pt.  Eean Buss T 01/26/2012, 8:23 AM

## 2012-01-26 NOTE — Progress Notes (Signed)
Social Work Patient ID: [name], male   DOB: [birth year], 66 y.o.   MRN: [id number] Met with pt and spoke with son to schedule family education for tomorrow.  Son can come at the 3:30 PT Session. Referral made to [facility] pt's pref to provide home health.  Work toward discharge for Friday.

## 2012-01-26 NOTE — Progress Notes (Signed)
Physical Therapy Session Note  Patient Details  Name: [name] MRN: [id number] Date of Birth: 01-19-1946  Today's Date: 01/26/2012 Time: 4:17-5:00 Time Calculation (min): 43 min Skilled Therapeutic Interventions/Progress Updates:  Tx focused on gait training and therex for increased LE and postural strength and ROM.  Sit<>stand with S and increased time required for set-up and balance, but no safety issues. Stand-step tranfers with RW and S, very safe and methodical technique noted.  Gait 1x150' with RW and S with cues for increasing DF as able on L Sitting edge of mat, performed anterior pelvic tilts x20 with tactile cues at ASIS for technique/direction. Shoulder external rotation and retraction with verbal, visual, and tactile cues for appropriate muscle recruitment and relaxing traps. L ankle DF x20 with cues for decreased supination.  Step-ups to 4" step with LLE leading for increased strength and control x20 with bil UE assist and SBA only.  Nu-step x22min, level 5 with bil LE and RUE for strengthening, targeting 50spm average with good success. Pt reported feeling fatigued at end of tx, requesting to ride back in [facility].  Pt reports no questions or concerns remaining prior to DC home this week, as long as all home modifications are in place.       Therapy Documentation Precautions:  Precautions Precautions: Fall Precaution Comments: Educated pt on cervical precautions. Required Braces or Orthoses: Cervical Brace Cervical Brace: Hard collar Restrictions Weight Bearing Restrictions: No Other Position/Activity Restrictions: patient has personal knee brace he likes to wear for comfort Pain: Pain Assessment Pain Score: 0-No pain Faces Pain Scale: No hurt :    See FIM for current functional status  Therapy/Group: Individual Therapy  Virl Cagey, PT  01/26/2012, 4:51 PM

## 2012-01-26 NOTE — Patient Care Conference (Signed)
Inpatient RehabilitationTeam Conference Note Date: 01/25/2012   Time: 2:05 PM    Patient Name: [name]      Medical Record Number: [id number]  Date of Birth: 03/11/1946 Sex: Male         Room/Bed: 4008/4008-01 Payor Info: Payor: MEDICARE  Plan: MEDICARE PART A AND B  Product Type: *No Product type*     Admitting Diagnosis: Cervical myelopathy s/p fusion  Admit Date/Time:  01/12/2012  2:22 PM Admission Comments: No comment available   Primary Diagnosis:  Myelopathy Principal Problem: Myelopathy  Patient Active Problem List   Diagnosis Date Noted  . Myelopathy 01/12/2012  . Hyperkalemia 01/11/2012  . Hypoxemia 01/11/2012  . Weakness 01/11/2012  . Respiratory failure, post-operative 01/10/2012    Expected Discharge Date: Expected Discharge Date: 01/24/12  Team Members Present: Physician: Dr. Faith Rogue Case Manager Present: Lutricia Horsfall, RN Social Worker Present: Dossie Der, LCSW Nurse Present: Daryll Brod, RN PT Present: Karolee Stamps, Mammie Lorenzo, PT OT Present: Edwin Cap, OT SLP Present: Feliberto Gottron, SLP     Current Status/Progress Goal Weekly Team Focus  Medical   constipated, workking on bowel meds.?more numbness ? recurvatum left knee  better knee stability and bowel and bladder regimen  see prior   Bowel/Bladder   Continent of bowel and bladder  Continent of bowel and bladder  Monitor effectiveness of current regiment   Swallow/Nutrition/ Hydration             ADL's   toilet transfers/shower transfers - supervision; bathing - supervision; dressing - supervision/min A  bathing - supervision, dressing, toilet transfers/toileting - mod I  home making tasks; safety awareness; transfers   Mobility   Supervision for mobility  supervision/modified independent  Improve gait mechanics, increase independence, decreasing falls risk    Communication             Safety/Cognition/ Behavioral Observations            Pain   Percocet x 2 q  4hrs, prn  <3  Manage effectiveness of pain medicaiton   Skin   Posterior cervical incision with sutures/staples with intermittent steris intact  Skin to remain free of infection and breakdown  Monitor q shift      *See Interdisciplinary Assessment and Plan and progress notes for long and short-term goals  Barriers to Discharge: ongoing neuro deficits    Possible Resolutions to Barriers:  adaptive equipment, wheelchair level goals    Discharge Planning/Teaching Needs:  Home to senior apt with family in and out.  At times will be alone.  Trying to do family education this week, son suppose to stay yesterday but did not.      Team Discussion:  Goals upgraded to mod I. Son needs to come for education. Poor control of knee, needs knee cage.  SSE ordered for constipation.  Revisions to Treatment Plan:  Goals upgraded to mod I.   Continued Need for Acute Rehabilitation Level of Care: The patient requires daily medical management by a physician with specialized training in physical medicine and rehabilitation for the following conditions: Daily direction of a multidisciplinary physical rehabilitation program to ensure safe treatment while eliciting the highest outcome that is of practical value to the patient.: Yes Daily medical management of patient stability for increased activity during participation in an intensive rehabilitation regime.: Yes Daily analysis of laboratory values and/or radiology reports with any subsequent need for medication adjustment of medical intervention for : Post surgical problems;Neurological problems  Meryl Dare 01/26/2012,  9:57 AM

## 2012-01-26 NOTE — Care Management Note (Signed)
Team conference f/u: Called pt's son and left a message requesting him to call re: scheduling time for family education. Explained importance of family ed for pt's safety. Talked with pt to report on team conference. Pt in agreement with goals and states that he is pleased with his progress and feels ready for discharge on Friday.

## 2012-01-26 NOTE — Progress Notes (Signed)
Patient ID: [name], male   DOB: 1945-11-14, 66 y.o.   MRN: [id number] Patient ID: [name], male   DOB: [birth year], 66 y.o.   MRN: [id number] Subjective/Complaints: Had bm yesterday. In shower with OT again this am Other ROS reviewed and negative. Exam- C collar in place; Chest- clear; CV- regular no tachy; Abd- benign    Objective: Vital Signs: Blood pressure 145/75, pulse 84, temperature 98.7 F (37.1 C), temperature source Oral, resp. rate 20, height 6' (1.829 m), weight 78.064 kg (172 lb 1.6 oz), SpO2 100.00%. No results found. No results found for this basename: WBC:2,HGB:2,HCT:2,PLT:2 in the last 72 hours No results found for this basename: NA:2,K:2,CL:2,CO2:2,GLUCOSE:2,BUN:2,CREATININE:2,CALCIUM:2 in the last 72 hours CBG (last 3)   Basename 01/25/12 2040 01/25/12 1616 01/25/12 1125  GLUCAP 187* 200* 154*    Wt Readings from Last 3 Encounters:  01/19/12 78.064 kg (172 lb 1.6 oz)  01/12/12 82.6 kg (182 lb 1.6 oz)  01/12/12 82.6 kg (182 lb 1.6 oz)    Physical Exam:  General appearance: alert, cooperative, appears stated age and no distress Head: Normocephalic, without obvious abnormality, atraumatic Eyes: anicteric, non injected Ears: ext nl Nose: Nares normal. Septum midline. Mucosa normal. No drainage or sinus tenderness. Throat: lips, mucosa, and tongue normal; teeth and gums normal Neck: no adenopathy, no carotid bruit, no JVD, supple, symmetrical, trachea midline and thyroid not enlarged, symmetric, no tenderness/mass/nodules Back: symmetric, no curvature. ROM normal. No CVA tenderness. Resp: clear to auscultation bilaterally Cardio: regular rate and rhythm, S1, S2 normal, no murmur, click, rub or gallop GI: soft, non-tender; bowel sounds normal; no masses,  no organomegaly Extremities: extremities normal, atraumatic, no cyanosis or edema Pulses: 2+ and symmetric Skin: Skin color, texture, turgor normal. No rashes or lesions Neurologic: Left UE 2+ to 3 at  biceps and shoulder. Tric 1+, we trace as are HI.  LLE 2+ to 3+/5 , RUE 4-/5, RLE 4/5, left heel cord a little tight. Incision/Wound:  Clean and intact   Assessment/Plan: 1. Functional deficits secondary to cervical stenosis with myelopathy and radiculopathy which require 3+ hours per day of interdisciplinary therapy in a comprehensive inpatient rehab setting. Physiatrist is providing close team supervision and 24 hour management of active medical problems listed below. Physiatrist and rehab team continue to assess barriers to discharge/monitor patient progress toward functional and medical goals.   FIM: FIM - Bathing Bathing Steps Patient Completed: Chest;Right Arm;Left Arm;Abdomen;Front perineal area;Right upper leg;Left upper leg;Right lower leg (including foot);Left lower leg (including foot) Bathing: 5: Supervision: Safety issues/verbal cues  FIM - Upper Body Dressing/Undressing Upper body dressing/undressing steps patient completed: Thread/unthread right sleeve of pullover shirt/dresss;Thread/unthread left sleeve of pullover shirt/dress;Put head through opening of pull over shirt/dress;Pull shirt over trunk Upper body dressing/undressing: 5: Supervision: Safety issues/verbal cues FIM - Lower Body Dressing/Undressing Lower body dressing/undressing steps patient completed: Thread/unthread right pants leg;Thread/unthread left pants leg;Pull pants up/down;Don/Doff right sock;Don/Doff left sock;Don/Doff right shoe;Don/Doff left shoe Lower body dressing/undressing: 5: Supervision: Safety issues/verbal cues  FIM - Toileting Toileting steps completed by patient: Adjust clothing prior to toileting;Performs perineal hygiene;Adjust clothing after toileting Toileting Assistive Devices: Grab bar or rail for support Toileting: 5: Supervision: Safety issues/verbal cues  FIM - Diplomatic Services operational officer Devices: Grab bars Toilet Transfers: 5-To toilet/BSC: Supervision (verbal  cues/safety issues);5-From toilet/BSC: Supervision (verbal cues/safety issues)  FIM - Banker Devices: Walker;Bed rails Bed/Chair Transfer: 5: Bed > Chair or W/C: Supervision (verbal cues/safety issues);5: Supine > Sit:  Supervision (verbal cues/safety issues);5: Chair or W/C > Bed: Supervision (verbal cues/safety issues)  FIM - Locomotion: Wheelchair Distance: 30' Locomotion: Wheelchair: 0: Activity did not occur (NA) FIM - Locomotion: Ambulation Locomotion: Ambulation Assistive Devices: Designer, industrial/product Ambulation/Gait Assistance: 5: Supervision Locomotion: Ambulation: 5: Travels 150 ft or more with supervision/safety issues  Comprehension Comprehension Mode: Auditory Comprehension: 7-Follows complex conversation/direction: With no assist  Expression Expression Mode: Verbal Expression: 7-Expresses complex ideas: With no assist  Social Interaction Social Interaction: 7-Interacts appropriately with others - No medications needed.  Problem Solving Problem Solving: 5-Solves basic 90% of the time/requires cueing < 10% of the time  Memory Memory: 7-Complete Independence: No helper  1. Cervical myelopathy. Status post C2-C7 laminectomy and fusion 01/04/2012 with postoperative epidural hematoma evacuation 01/09/2012   -neuro exam stable, may fatigue from time to time. 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 2. DVT Prophylaxis/Anticoagulation: SCDs. Monitor for any signs of DVT  3.. Pain Management: Decadron taper as advised, Robaxin and Percocet as needed. Monitor with increased mobility   -encouraged taking a percocet before he gets started each day so as not to let pain get out of control.  -voltaren gel 4. Neuropsych: This patient is capable of making decisions on his/her own behalf.  5. Hypertension. Presently on no antihypertensive medication. Patient on lisinopril 10 mg daily prior to admission. Will monitor with  increased mobility  6. Diabetes mellitus.  resumed Glucotrol 5 mg twice a day as prior to admission. Patient also on Glucophage 1000 mg twice a day PTA---decrease to 500mg  bid for now   --off decadron now  7. Hyperlipidemia. Zocor  8. Neurogenic bowel: augmented bowel regimen senna S qhs. Try sorbitol today followed by SSE  LOS (Days) 14 A FACE TO FACE EVALUATION WAS PERFORMED  SWARTZ,ZACHARY T 01/26/2012, 7:21 AM

## 2012-01-26 NOTE — Progress Notes (Signed)
Occupational Therapy Session Note  Patient Details  Name: [name] MRN: [id number] Date of Birth: 01/25/1946  Today's Date: 01/26/2012  Session 1 Time: 0700-0800 Time Calculation (min): 60 min  Short Term Goals: Week 2:   =LTG  Skilled Therapeutic Interventions/Progress Updates:    Pt engaged in bathing at shower level and dressing seated at EOB.  Pt amb with RW in room to gather supplies and clothing.  Pt completed all tasks with supervision.  Focus on activity tolerance and safety awareness.  Pt employs compensatory strategies with bathing and dressing tasks utilizing AE as appropriate.  Pt states he is pleased with progress and feels prepared for discharge on 7/12.  Therapy Documentation Precautions:  Precautions Precautions: Fall Precaution Comments: Educated pt on cervical precautions. Required Braces or Orthoses: Cervical Brace Cervical Brace: Hard collar Restrictions Weight Bearing Restrictions: No Other Position/Activity Restrictions: patient has personal knee brace he likes to wear for comfort   Pain: Pt denies pain  See FIM for current functional status  Therapy/Group: Individual Therapy  Session 2 Time: 1300-1330 Pt denies pain Individual Therapy Pt engaged in doffing/donning Swedish Knee Cage on right knee.  Pt performs tasks independently.  Pt transitioned to functional amb with RW to complete home mgmt tasks at supervision level.  [name] [facility] 01/26/2012, 10:18 AM

## 2012-01-27 LAB — GLUCOSE, CAPILLARY
Glucose-Capillary: 164 mg/dL — ABNORMAL HIGH (ref 70–99)
Glucose-Capillary: 142 mg/dL — ABNORMAL HIGH (ref 70–99)
Glucose-Capillary: 139 mg/dL — ABNORMAL HIGH (ref 70–99)

## 2012-01-27 NOTE — Progress Notes (Addendum)
Physical Therapy Session Note  Patient Details  Name: [name] MRN: [id number] Date of Birth: 05-Jun-1946  Today's Date: 01/27/2012 Time: 0830-0930 Time Calculation (min): 60 min  Short Term Goals: STG=LTG   Skilled Therapeutic Interventions/Progress Updates:    Gait 2 x 150' with RW modified independent, Lt. Hand splint, Lt. Knee cage. Home ambulation over carpet and tile surfaces 2 x 60' with RW and modified independent. Practiced stairs, performed 6 with one rail, supervision for safety. Pt made "modified independent" in room, educated pt on his risk of falling, ways to decrease risk in room, and what his new "status" entailed.    Therapy Documentation Precautions:  Precautions Precautions: Fall Precaution Comments: Educated pt on cervical precautions. Required Braces or Orthoses: Cervical Brace Cervical Brace: Hard collar Restrictions Weight Bearing Restrictions: No Other Position/Activity Restrictions: patient has personal knee brace he likes to wear for comfort Pain: Pain Assessment Pain Assessment: 0-10 Pain Score:   4 Pain Type: Acute pain Pain Location: Neck Pain Orientation: Posterior Pain Descriptors: Aching Pain Onset: Gradual Patients Stated Pain Goal: 2 Pain Intervention(s): RN made aware   See FIM for current functional status  Therapy/Group: Individual Therapy  [name] 01/27/2012, 9:30 AM

## 2012-01-27 NOTE — Progress Notes (Signed)
Posterior cervical staples removed per order. Pt tolerated procedure well.

## 2012-01-27 NOTE — Progress Notes (Signed)
Occupational Therapy Session Note  Patient Details  Name: [name] MRN: [id number] Date of Birth: 1945/12/27  Today's Date: 01/27/2012  Session 1 Time: 0700-0758 Time Calculation (min): 58 min  Short Term Goals: Week 2:   =LTG  Skilled Therapeutic Interventions/Progress Updates:    Pt seated EOB preparing for bathing and dressing.  Pt amb with RW to gather supplies on the way to bathroom to use toilet and shower.  Pt completed all bathing tasks with supervision using AE PRN.  Pt amb to room to complete dressing tasks seated EOB.  Pt performed all dressing tasks with mod I.  Discussed with PT about making patient mod I in room.  Focus on safety awareness and activity tolerance.  Therapy Documentation Precautions:  Precautions Precautions: Fall Precaution Comments: Educated pt on cervical precautions. Required Braces or Orthoses: Cervical Brace Cervical Brace: Hard collar Restrictions Weight Bearing Restrictions: No Other Position/Activity Restrictions: patient has personal knee brace he likes to wear for comfort Pain: Pain Assessment Pain Assessment: 0-10 Pain Score:   4 Pain Type: Acute pain Pain Location: Neck Pain Orientation: Posterior Pain Descriptors: Aching Pain Onset: Gradual Patients Stated Pain Goal: 2 Pain Intervention(s): RN made aware  See FIM for current functional status  Therapy/Group: Individual Therapy  Session 2 Time: 1100-1145 Pt c/o pain 7/10 at end of session; RN aware Individual Therapy Pt completed kitchen tasks (cooking fried eggs) with assistance to open containers.  Pt amb with RW in kitchen to gather all supplies and clean up after task.  No unsafe behaviors noted.  Pt employing compensatory strategies secondary decreased function with LUE.  [name] [facility] 01/27/2012, 7:59 AM

## 2012-01-27 NOTE — Progress Notes (Signed)
Physical Therapy Discharge Summary  Patient Details  Name: [name] MRN: [id number] Date of Birth: April 30, 1946  Today's Date: 01/27/2012 Time: 1530-1600 Time Calculation (min): 30 min  Skilled Therapeutic Interventions/Progress Updates:  Pain: No c/o Pain Session primarily focused on family education of pt's son. Practiced gait x 175' with RW, educated son on reasons for knee cage to decrease joint degeneration, need for cues occasionally for Lt. Scapular winging, need for use of RW instead of cane secondary to risk for falls, and importance of having assist around particularly during 1st 2 weeks home. Practiced a car transfer with son providing supervision. Discussed son's positioning to protect pt if going over uneven terrain. Son verbalized understanding of all education. Pt and son have no further questions or concerns.   Patient has met 8 of 8 long term goals due to improved activity tolerance, improved balance, improved postural control, increased functional strength and ability to compensate for deficits.  Patient to discharge at an ambulatory level Modified Independent.   Patient's care partner is independent to provide the necessary physical assistance at discharge.  Reasons goals not met: NA  Recommendation:  Patient will benefit from ongoing skilled PT services in home health setting to continue to advance safe functional mobility, address ongoing impairments in decreased endurance, poor gait mechanics, and minimize fall risk.  Equipment: Lt. knee cage, Lt. heel lift, Lt. hand splint, RW  Reasons for discharge: treatment goals met and discharge from hospital  Patient/family agrees with progress made and goals achieved: Yes  PT Discharge Precautions/Restrictions  Cervical collar on at all times Vital Signs Therapy Vitals Temp: 99.2 F (37.3 C) Temp src: Oral Pulse Rate: 68  Resp: 18  BP: 117/74 mmHg Oxygen Therapy SpO2: 99 % Pain Pain Assessment Pain Assessment:  No/denies pain  Cognition Overall Cognitive Status: Appears within functional limits for tasks assessed Arousal/Alertness: Awake/alert Orientation Level: Oriented X4 Memory: Appears intact Awareness: Appears intact Problem Solving: Appears intact Safety/Judgment: Appears intact Sensation Sensation Light Touch: Impaired by gross assessment (Diminished LT LE sensation per pt report) Proprioception: Appears Intact Coordination Gross Motor Movements are Fluid and Coordinated: No Fine Motor Movements are Fluid and Coordinated: No Coordination and Movement Description: decreased gross and fine motor movements of LUE   Mobility Bed Mobility Bed Mobility: Rolling Right;Rolling Left;Right Sidelying to Sit;Sit to Sidelying Right Rolling Right: 6: Modified independent (Device/Increase time) Rolling Left: 6: Modified independent (Device/Increase time) Right Sidelying to Sit: 6: Modified independent (Device/Increase time) Left Sidelying to Sit: 6: Modified independent (Device/Increase time) Supine to Sit: 6: Modified independent (Device/Increase time) Sitting - Scoot to Edge of Bed: 6: Modified independent (Device/Increase time) Sit to Sidelying Right: 6: Modified independent (Device/Increase time) Transfers Sit to Stand: 6: Modified independent (Device/Increase time) Stand to Sit: 6: Modified independent (Device/Increase time) Locomotion  Ambulation Ambulation: Yes Ambulation/Gait Assistance: 6: Modified independent (Device/Increase time) Ambulation Distance (Feet): 150 Feet Assistive device: Rolling walker;Other (Comment) (Lt. hand splint) Gait Gait: Yes Gait Pattern: Impaired Gait Pattern: Step-through pattern;Decreased step length - right;Decreased hip/knee flexion - left;Decreased dorsiflexion - left;Left genu recurvatum;Decreased weight shift to left (recurvatum resolved by knee cage) High Level Ambulation High Level Ambulation: Side stepping;Backwards walking;Direction  changes;Sudden stops Side Stepping: modified independent in home environment  Backwards Walking: modified independent in home environment  Direction Changes: modified independent in home environment  Sudden Stops: modified independent in home environment  Stairs / Additional Locomotion Stairs: Yes Stairs Assistance: 5: Supervision Stair Management Technique: One rail Right;Step to pattern;Forwards Number of  Stairs: 6  Height of Stairs: 6    Balance Balance Balance Assessed: Yes Static Sitting Balance Static Sitting - Balance Support: Feet supported;No upper extremity supported Static Sitting - Level of Assistance: 7: Independent Dynamic Sitting Balance Dynamic Sitting - Level of Assistance: 6: Modified independent (Device/Increase time) Static Standing Balance Static Standing - Balance Support: During functional activity;Left upper extremity supported Static Standing - Level of Assistance: 6: Modified independent (Device/Increase time) Extremity Assessment      RLE Assessment RLE Assessment: Within Functional Limits RLE Strength RLE Overall Strength Comments: Hip flex/abd/add 4/5; knee flex/ext and PF/DF 4-/5 LLE Assessment LLE Assessment: Exceptions to [facility] LLE Strength LLE Overall Strength Comments: Hip flex/abd/add 4-/5; knee ext 4/5; PF/DF and knee flex 3+/5  See FIM for current functional status  Wilhemina Bonito 01/27/2012, 6:26 PM

## 2012-01-27 NOTE — Discharge Summary (Signed)
[name], [name] NO.:  [id number]  MEDICAL RECORD NO.:  [id number]  LOCATION:  4008                         FACILITY:  [facility]  PHYSICIAN:  Ranelle Oyster, M.D.DATE OF BIRTH:  17-Oct-1945  DATE OF ADMISSION:  01/12/2012 DATE OF DISCHARGE:                              DISCHARGE SUMMARY   DISCHARGE DATE:  January 28, 2012  DISCHARGE DIAGNOSES: 1. Cervical myelopathy. 2. Sequential compression devices for deep venous thrombosis     prophylaxis. 3. Pain management. 4. Hypertension. 5. Diabetes mellitus. 6. Hyperlipidemia. 7. Neurogenic bowel. This is a 66 year old right-handed male, admitted January 04, 2012, with weakness upper extremities that steadily worsened.  X-rays and imaging revealed cervical stenosis with myelopathy.  Underwent posterior cervical laminectomy C2-T1 with posterior fusion C2-T1 January 04, 2012, per Dr. Phoebe Perch.  Postoperatively, the patient developed progressive weakness that prompted an MRI of the neck that showed a large fluid collection in the surgical plane compressing the spinal cord.  Underwent emergent exploration of posterior cervical wound, evacuation of wound, epidural hematoma January 09, 2012, per Dr. Newell Coral.  The patient was fitted with cervical brace, postoperative pain management and remained on Decadron protocol.  The patient was admitted for comprehensive rehab program.  PAST MEDICAL HISTORY:  See discharge diagnoses.  SOCIAL HISTORY:  Lives alone.  He has a supportive family.  One level home.  Functional status upon admission to Rehab Services was independent.  He does drive.  He is unemployed.  He is a retired Investment banker, operational. Functional status upon admission to rehab services was minimal assist to scoot to the edge of the bed, +2 total assist to ambulate 8 feet.  PHYSICAL EXAMINATION:  VITAL SIGNS:  Blood pressure 107/67, pulse 62, temperature 97, respirations 20. GENERAL:  This was an alert male, in no acute distress, oriented  x3, well developed. NECK:  Cervical collar in place. LUNGS:  Clear to auscultation. CARDIAC:  Regular rate and rhythm. ABDOMEN:  Soft, nontender.  Good bowel sounds.  Surgical site clean and dry.  REHABILITATION HOSPITAL COURSE:  The patient was admitted to inpatient rehab services with therapies initiated on a 3-hour daily basis consisting of physical therapy, occupational therapy, and rehabilitation nursing.  The following issues were addressed during the patient's rehabilitation stay.  Pertaining to [name]' cervical myelopathy, he had undergone C2-C7 laminectomy with postoperative epidural hematoma evacuation January 09, 2012.  He continued with cervical collar and planned to be followup with Neurosurgery outpatient.  He continued on sequential compression devices for DVT prophylaxis.  He was weaned from his Decadron protocol.  His blood pressures were well-controlled with no current antihypertensive medications.  His blood sugars had some variables which improved after Decadron discontinued.  He remained on low-dose glipizide as well as Glucophage with monitoring of blood sugars a.c. and at bedtime.  He continued on Zocor for hyperlipidemia.  His bowel program was well established with laxative assistance as needed. The patient received weekly collaborative interdisciplinary team conferences to discuss estimated length of stay, family teaching, and any barriers to discharge.  He was continent of bowel and bladder, toilet transfers, shower transfer supervision, bathing supervision, dressing supervision, minimal assist.  He was supervision overall  for his mobility.  Family teaching was completed with his son.  Plan was to be discharged to home with therapies, continued to improve as his overall functional mobility.  DISCHARGE MEDICATIONS: 1. Voltaren gel 4 times daily as needed. 2. Glipizide 10 mg p.o. daily. 3. Glucophage 500 mg b.i.d. 4. Robaxin 500 mg every 6 hours as  needed. 5. Percocet 2 tablets every 4 hours as needed for pain. 6. Senokot-S tablets 3 at bedtime.  Hold for loose stool. 7. Zocor 20 mg daily.  DIET:  Diabetic diet.  SPECIAL INSTRUCTIONS:  Cervical collar at all times.  The patient should follow up Dr. Newell Coral, Neurosurgery, call for appointment, Dr. Faith Rogue at the Outpatient Rehab Service Office 02/09/12 arrive 10:30 am, Dr. Knox Royalty, February 04, 2012.     Mariam Dollar, P.A.   ______________________________ Ranelle Oyster, M.D.    DA/MEDQ  D:  01/27/2012  T:  01/27/2012  Job:  [id number]  cc:   Hewitt Shorts, M.D. Knox Royalty, MD

## 2012-01-27 NOTE — Progress Notes (Signed)
Subjective/Complaints: Walking with knee cage Other ROS reviewed and negative. Exam- C collar in place; Chest- clear; CV- regular no tachy; Abd- benign    Objective: Vital Signs: Blood pressure 113/69, pulse 67, temperature 98.7 F (37.1 C), temperature source Oral, resp. rate 18, height 6' (1.829 m), weight 78.382 kg (172 lb 12.8 oz), SpO2 95.00%. No results found. No results found for this basename: WBC:2,HGB:2,HCT:2,PLT:2 in the last 72 hours No results found for this basename: NA:2,K:2,CL:2,CO2:2,GLUCOSE:2,BUN:2,CREATININE:2,CALCIUM:2 in the last 72 hours CBG (last 3)   Basename 01/27/12 0753 01/26/12 2005 01/26/12 1625  GLUCAP 164* 163* 149*    Wt Readings from Last 3 Encounters:  01/26/12 78.382 kg (172 lb 12.8 oz)  01/12/12 82.6 kg (182 lb 1.6 oz)  01/12/12 82.6 kg (182 lb 1.6 oz)    Physical Exam:  General appearance: alert, cooperative, appears stated age and no distress Head: Normocephalic, without obvious abnormality, atraumatic Eyes: anicteric, non injected Ears: ext nl Nose: Nares normal. Septum midline. Mucosa normal. No drainage or sinus tenderness. Throat: lips, mucosa, and tongue normal; teeth and gums normal Neck: no adenopathy, no carotid bruit, no JVD, supple, symmetrical, trachea midline and thyroid not enlarged, symmetric, no tenderness/mass/nodules Back: symmetric, no curvature. ROM normal. No CVA tenderness. Resp: clear to auscultation bilaterally Cardio: regular rate and rhythm, S1, S2 normal, no murmur, click, rub or gallop GI: soft, non-tender; bowel sounds normal; no masses,  no organomegaly Extremities: extremities normal, atraumatic, no cyanosis or edema Pulses: 2+ and symmetric Skin: Skin color, texture, turgor normal. No rashes or lesions Neurologic: Left UE 2+ to 3 at biceps and shoulder. Tric 1+, we trace as are HI.  LLE 2+ to 3+/5 , RUE 4-/5, RLE 4/5, left heel cord a little tight. Needs cues for ADF with gait. Looks much  More steady with  knee cage.  Incision/Wound:  Clean and intact   Assessment/Plan: 1. Functional deficits secondary to cervical stenosis with myelopathy and radiculopathy which require 3+ hours per day of interdisciplinary therapy in a comprehensive inpatient rehab setting. Physiatrist is providing close team supervision and 24 hour management of active medical problems listed below. Physiatrist and rehab team continue to assess barriers to discharge/monitor patient progress toward functional and medical goals.   FIM: FIM - Bathing Bathing Steps Patient Completed: Chest;Right Arm;Left Arm;Abdomen;Front perineal area;Right upper leg;Left upper leg;Right lower leg (including foot);Left lower leg (including foot) Bathing: 5: Supervision: Safety issues/verbal cues  FIM - Upper Body Dressing/Undressing Upper body dressing/undressing steps patient completed: Thread/unthread right sleeve of pullover shirt/dresss;Thread/unthread left sleeve of pullover shirt/dress;Put head through opening of pull over shirt/dress;Pull shirt over trunk Upper body dressing/undressing: 6: More than reasonable amount of time FIM - Lower Body Dressing/Undressing Lower body dressing/undressing steps patient completed: Thread/unthread right pants leg;Thread/unthread left pants leg;Pull pants up/down;Don/Doff right sock;Don/Doff left sock;Don/Doff right shoe;Don/Doff left shoe Lower body dressing/undressing: 6: More than reasonable amount of time  FIM - Toileting Toileting steps completed by patient: Adjust clothing prior to toileting;Performs perineal hygiene;Adjust clothing after toileting Toileting Assistive Devices: Grab bar or rail for support Toileting: 6: More than reasonable amount of time  FIM - Diplomatic Services operational officer Devices: Grab bars;Walker Toilet Transfers: 6-To toilet/ BSC;6-From toilet/BSC  FIM - Banker Devices: Therapist, occupational: 6: Supine > Sit: No  assist;6: More than reasonable amt of time;6: Bed > Chair or W/C: No assist  FIM - Locomotion: Wheelchair Distance: 30' Locomotion: Wheelchair: 1: Total Assistance/staff pushes wheelchair (Pt<25%) FIM - Locomotion: Ambulation  Locomotion: Ambulation Assistive Devices: Designer, industrial/product Ambulation/Gait Assistance: 5: Supervision Locomotion: Ambulation: 5: Travels 150 ft or more with supervision/safety issues  Comprehension Comprehension Mode: Auditory Comprehension: 7-Follows complex conversation/direction: With no assist  Expression Expression Mode: Verbal Expression: 7-Expresses complex ideas: With no assist  Social Interaction Social Interaction: 7-Interacts appropriately with others - No medications needed.  Problem Solving Problem Solving: 7-Solves complex problems: Recognizes & self-corrects  Memory Memory: 7-Complete Independence: No helper  1. Cervical myelopathy. Status post C2-C7 laminectomy and fusion 01/04/2012 with postoperative epidural hematoma evacuation 01/09/2012   -neuro exam stable, may fatigue from time to time. 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  -may do well with a kick plate as well for his shoes, knee cage looks to be helping 2. DVT Prophylaxis/Anticoagulation: SCDs. Monitor for any signs of DVT  3.. Pain Management: Decadron taper as advised, Robaxin and Percocet as needed. Monitor with increased mobility   -encouraged taking a percocet before he gets started each day so as not to let pain get out of control.  -voltaren gel 4. Neuropsych: This patient is capable of making decisions on his/her own behalf.  5. Hypertension. Presently on no antihypertensive medication. Patient on lisinopril 10 mg daily prior to admission. Will monitor with increased mobility  6. Diabetes mellitus.  resumed Glucotrol 5 mg twice a day as prior to admission. Patient also on Glucophage 1000 mg twice a day PTA---decrease to 500mg  bid for now   --sugars  normalizing. 7. Hyperlipidemia. Zocor  8. Neurogenic bowel: augmented bowel regimen- seeking qod movements  LOS (Days) 15 A FACE TO FACE EVALUATION WAS PERFORMED  [name] T 01/27/2012, 8:57 AM

## 2012-01-27 NOTE — Progress Notes (Signed)
Occupational Therapy Discharge Summary  Patient Details  Name: [name] MRN: [id number] Date of Birth: 1946-03-18  Today's Date: 01/27/2012  Patient has met 14 of 14 long term goals due to improved activity tolerance, improved balance, postural control, ability to compensate for deficits, functional use of  RIGHT upper and LEFT upper extremity, improved attention, improved awareness and improved coordination.  Pt has made excellent progress this admission and is mod I for dressing, toilet transfers, and toileting; supervision for bathing and tub transfers; min A for simple kitchen tasks and home mgmt tasks.  No family has been in for education.  Pt has exhibited independence in directing care in those areas where assistance is required. Patient to discharge at overall mod I -> supervision level and min assist for IADLs.    Recommendation:  Patient will benefit from ongoing skilled OT services in home health setting to continue to advance functional skills in the area of BADL and iADL.  Equipment: Tub transfer bench and BSC  Reasons for discharge: treatment goals met and discharge from hospital  Patient/family agrees with progress made and goals achieved: Yes  ADL ADL Equipment Provided: Reacher;Long-handled sponge Eating: Independent Where Assessed-Eating: Chair Grooming: Modified independent Where Assessed-Grooming: Standing at sink Upper Body Bathing: Supervision/safety Where Assessed-Upper Body Bathing: Shower Lower Body Bathing: Supervision/safety Where Assessed-Lower Body Bathing: Shower Upper Body Dressing: Modified independent (Device) Where Assessed-Upper Body Dressing: Edge of bed Lower Body Dressing: Modified independent Where Assessed-Lower Body Dressing: Edge of bed Toileting: Modified independent Where Assessed-Toileting: Teacher, adult education: Engineer, agricultural Method: Event organiser: Distant supervision Training and development officer Method: Designer, industrial/product: Emergency planning/management officer  Vision/Perception  Vision - History Baseline Vision: Wears glasses all the time Patient Visual Report: No change from baseline   Cognition Overall Cognitive Status: Appears within functional limits for tasks assessed Arousal/Alertness: Awake/alert Orientation Level: Oriented X4 Memory: Appears intact Awareness: Appears intact Problem Solving: Appears intact Safety/Judgment: Appears intact  Sensation Sensation Light Touch: Impaired by gross assessment (Diminished LT throughout Bilateral UE (L>R) and LLE) Proprioception: Appears Intact Coordination Gross Motor Movements are Fluid and Coordinated: No Fine Motor Movements are Fluid and Coordinated: No Coordination and Movement Description: decreased gross and fine motor movements of LUE     Trunk/Postural Assessment  Cervical Assessment Cervical Assessment: Exceptions to [facility] (posterior incision; assessment limited d/t hard collar) Thoracic Assessment Thoracic Assessment: Within Functional Limits Lumbar Assessment Lumbar Assessment: Within Functional Limits Postural Control Postural Control: Within Functional Limits    Extremity/Trunk Assessment RUE Assessment RUE Assessment: Within Functional Limits LUE Assessment LUE Assessment: Exceptions to [facility] (limited AROM at wrist and fingers)  See FIM for current functional status  Rich Brave 01/27/2012, 11:56 AM

## 2012-01-27 NOTE — Progress Notes (Signed)
Per State Regulation 482.30 This chart was reviewed for medical necessity with respect to the patient's Admission/Duration of stay. Lane-Morgan, Alajiah Dutkiewicz Anne                 Nurse Care Manager               

## 2012-01-28 DIAGNOSIS — M4712 Other spondylosis with myelopathy, cervical region: Secondary | ICD-10-CM

## 2012-01-28 DIAGNOSIS — Z5189 Encounter for other specified aftercare: Secondary | ICD-10-CM

## 2012-01-28 DIAGNOSIS — K592 Neurogenic bowel, not elsewhere classified: Secondary | ICD-10-CM

## 2012-01-28 DIAGNOSIS — N319 Neuromuscular dysfunction of bladder, unspecified: Secondary | ICD-10-CM

## 2012-01-28 LAB — GLUCOSE, CAPILLARY: Glucose-Capillary: 144 mg/dL — ABNORMAL HIGH (ref 70–99)

## 2012-01-28 MED ORDER — METHOCARBAMOL 500 MG PO TABS: 500.0000 mg | ORAL_TABLET | Freq: Four times a day (QID) | ORAL | Status: AC | PRN

## 2012-01-28 MED ORDER — DICLOFENAC SODIUM 1 % TD GEL: 1.0000 "application " | Freq: Four times a day (QID) | TRANSDERMAL | Status: DC

## 2012-01-28 MED ORDER — OXYCODONE-ACETAMINOPHEN 5-325 MG PO TABS: 2.0000 | ORAL_TABLET | ORAL | Status: AC | PRN

## 2012-01-28 MED ORDER — SENNOSIDES-DOCUSATE SODIUM 8.6-50 MG PO TABS: 3.0000 | ORAL_TABLET | Freq: Every day | ORAL | Status: AC

## 2012-01-28 MED ORDER — METFORMIN HCL 500 MG PO TABS: 500.0000 mg | ORAL_TABLET | Freq: Two times a day (BID) | ORAL | Status: DC

## 2012-01-28 NOTE — Progress Notes (Signed)
Patient ID: [name], male   DOB: [birth year], 66 y.o.   MRN: [id number] Subjective/Complaints: Walking with knee cage. It seems to be helping gait. Other ROS reviewed and negative. Exam- C collar in place; Chest- clear; CV- regular no tachy; Abd- benign    Objective: Vital Signs: Blood pressure 120/78, pulse 63, temperature 98 F (36.7 C), temperature source Oral, resp. rate 18, height 6' (1.829 m), weight 78.382 kg (172 lb 12.8 oz), SpO2 95.00%. No results found. No results found for this basename: WBC:2,HGB:2,HCT:2,PLT:2 in the last 72 hours No results found for this basename: NA:2,K:2,CL:2,CO2:2,GLUCOSE:2,BUN:2,CREATININE:2,CALCIUM:2 in the last 72 hours CBG (last 3)   Basename 01/28/12 0720 01/27/12 2055 01/27/12 1624  GLUCAP 144* 139* 142*    Wt Readings from Last 3 Encounters:  01/26/12 78.382 kg (172 lb 12.8 oz)  01/12/12 82.6 kg (182 lb 1.6 oz)  01/12/12 82.6 kg (182 lb 1.6 oz)    Physical Exam:  General appearance: alert, cooperative, appears stated age and no distress Head: Normocephalic, without obvious abnormality, atraumatic Eyes: anicteric, non injected Ears: ext nl Nose: Nares normal. Septum midline. Mucosa normal. No drainage or sinus tenderness. Throat: lips, mucosa, and tongue normal; teeth and gums normal Neck: no adenopathy, no carotid bruit, no JVD, supple, symmetrical, trachea midline and thyroid not enlarged, symmetric, no tenderness/mass/nodules Back: symmetric, no curvature. ROM normal. No CVA tenderness. Resp: clear to auscultation bilaterally Cardio: regular rate and rhythm, S1, S2 normal, no murmur, click, rub or gallop GI: soft, non-tender; bowel sounds normal; no masses,  no organomegaly Extremities: extremities normal, atraumatic, no cyanosis or edema Pulses: 2+ and symmetric Skin: Skin color, texture, turgor normal. No rashes or lesions Neurologic: Left UE 2+ to 3 at biceps and shoulder. Tric 1+, we trace as are HI.  LLE 2+ to 3+/5 , RUE  4-/5, RLE 4/5, left heel cord a little tight. Needs cues for ADF with gait. Looks much  More steady with knee cage.  Incision/Wound:  Clean and intact   Assessment/Plan: 1. Functional deficits secondary to cervical stenosis with myelopathy and radiculopathy which require 3+ hours per day of interdisciplinary therapy in a comprehensive inpatient rehab setting. Physiatrist is providing close team supervision and 24 hour management of active medical problems listed below. Physiatrist and rehab team continue to assess barriers to discharge/monitor patient progress toward functional and medical goals.  outpt follow up with me in about a month. Reviewed prognosis with patient today FIM: FIM - Bathing Bathing Steps Patient Completed: Chest;Right Arm;Left Arm;Abdomen;Front perineal area;Right upper leg;Left upper leg;Right lower leg (including foot);Left lower leg (including foot) Bathing: 5: Supervision: Safety issues/verbal cues  FIM - Upper Body Dressing/Undressing Upper body dressing/undressing steps patient completed: Thread/unthread right sleeve of pullover shirt/dresss;Thread/unthread left sleeve of pullover shirt/dress;Put head through opening of pull over shirt/dress;Pull shirt over trunk Upper body dressing/undressing: 6: More than reasonable amount of time FIM - Lower Body Dressing/Undressing Lower body dressing/undressing steps patient completed: Thread/unthread right pants leg;Thread/unthread left pants leg;Pull pants up/down;Don/Doff right sock;Don/Doff left sock;Don/Doff right shoe;Don/Doff left shoe Lower body dressing/undressing: 6: More than reasonable amount of time  FIM - Toileting Toileting steps completed by patient: Adjust clothing prior to toileting Toileting Assistive Devices: Grab bar or rail for support Toileting: 6: More than reasonable amount of time  FIM - Diplomatic Services operational officer Devices: Grab bars;Walker Toilet Transfers: 6-To toilet/ BSC;6-From  toilet/BSC  FIM - Banker Devices: Therapist, occupational: 6: Assistive device: no helper;6: Sit > Supine:  No assist;6: Supine > Sit: No assist;6: Bed > Chair or W/C: No assist;6: Chair or W/C > Bed: No assist  FIM - Locomotion: Wheelchair Distance: 30' Locomotion: Wheelchair: 0: Activity did not occur (not means of locomotion, unsafe due to hypotonic hand) FIM - Locomotion: Ambulation Locomotion: Ambulation Assistive Devices: Walker - Rolling;Other (comment) (hand splint) Ambulation/Gait Assistance: 6: Modified independent (Device/Increase time) Locomotion: Ambulation: 6: Travels 150 ft or more with assistive device/no helper  Comprehension Comprehension Mode: Auditory Comprehension: 7-Follows complex conversation/direction: With no assist  Expression Expression Mode: Verbal Expression: 7-Expresses complex ideas: With no assist  Social Interaction Social Interaction: 6-Interacts appropriately with others with medication or extra time (anti-anxiety, antidepressant).  Problem Solving Problem Solving: 7-Solves complex problems: Recognizes & self-corrects  Memory Memory: 7-Complete Independence: No helper  1. Cervical myelopathy. Status post C2-C7 laminectomy and fusion 01/04/2012 with postoperative epidural hematoma evacuation 01/09/2012   -neuro exam stable, may fatigue from time to time. 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  -may do well with a kick plate eventually as well for his shoes, knee cage looks to be helping 2. DVT Prophylaxis/Anticoagulation: SCDs. Monitor for any signs of DVT  3.. Pain Management: Decadron taper as advised, Robaxin and Percocet as needed. Monitor with increased mobility   -encouraged taking a percocet before he gets started each day so as not to let pain get out of control.  -voltaren gel 4. Neuropsych: This patient is capable of making decisions on his/her own behalf.  5.  Hypertension. Presently on no antihypertensive medication. Patient on lisinopril 10 mg daily prior to admission. Will monitor with increased mobility  6. Diabetes mellitus.  resumed Glucotrol 5 mg twice a day as prior to admission. Patient also on Glucophage 1000 mg twice a day PTA---decrease to 500mg  bid for now   --sugars normalizing. 7. Hyperlipidemia. Zocor  8. Neurogenic bowel: augmented bowel regimen- seeking qod movements  LOS (Days) 16 A FACE TO FACE EVALUATION WAS PERFORMED  SWARTZ,ZACHARY T 01/28/2012, 9:02 AM

## 2012-01-28 NOTE — Progress Notes (Signed)
Social Work Discharge Note Discharge Note  The overall goal for the admission was met for:   Discharge location: Yes-HOME WITH FAMILY PROVIDING INTERMITTENT SUPERVISION  Length of Stay: Yes-16 DAYS  Discharge activity level: Yes-MOD/I LEVEL  Home/community participation: Yes  Services provided included: MD, RD, PT, OT, RN, CM, TR, Pharmacy and SW  Financial Services: Medicare  Follow-up services arranged: Home Health: [facility]-PT,OT,RN, DME: [facility], BSC, TUB [facility]EN[facility], Patient/Family has no preference for HH/DME agencies and Patient/Family request agency HH: PREF [facility], DME: NO PREF  Comments (or additional information):  Patient/Family verbalized understanding of follow-up arrangements: Yes  Individual responsible for coordination of the follow-up plan: PATIENT AND [name]-SON  Confirmed correct DME delivered: Lucy Chris 01/28/2012    Emiley Digiacomo, Lemar Livings

## 2012-01-28 NOTE — Progress Notes (Signed)
1035 Pt. Discharged to home with son; VSS, Aox3.  Sherol Dade, PA discharged patient with handout instructions; no further questions asked.  All personal belongings in tow. Escorted by Joni Reining, NT.

## 2012-02-09 ENCOUNTER — Telehealth: Payer: Self-pay

## 2012-02-09 ENCOUNTER — Encounter: Payer: Self-pay | Admitting: Physical Medicine & Rehabilitation

## 2012-02-09 ENCOUNTER — Encounter: Payer: Medicare Other | Attending: Physical Medicine & Rehabilitation | Admitting: Physical Medicine & Rehabilitation

## 2012-02-09 VITALS — BP 137/67 | HR 61 | Resp 16 | Ht 72.0 in | Wt 172.8 lb

## 2012-02-09 DIAGNOSIS — Z981 Arthrodesis status: Secondary | ICD-10-CM | POA: Insufficient documentation

## 2012-02-09 DIAGNOSIS — M4712 Other spondylosis with myelopathy, cervical region: Secondary | ICD-10-CM | POA: Insufficient documentation

## 2012-02-09 DIAGNOSIS — M1711 Unilateral primary osteoarthritis, right knee: Secondary | ICD-10-CM | POA: Insufficient documentation

## 2012-02-09 DIAGNOSIS — E119 Type 2 diabetes mellitus without complications: Secondary | ICD-10-CM | POA: Insufficient documentation

## 2012-02-09 DIAGNOSIS — M1710 Unilateral primary osteoarthritis, unspecified knee: Secondary | ICD-10-CM

## 2012-02-09 DIAGNOSIS — E785 Hyperlipidemia, unspecified: Secondary | ICD-10-CM | POA: Insufficient documentation

## 2012-02-09 DIAGNOSIS — F17200 Nicotine dependence, unspecified, uncomplicated: Secondary | ICD-10-CM | POA: Insufficient documentation

## 2012-02-09 DIAGNOSIS — K592 Neurogenic bowel, not elsewhere classified: Secondary | ICD-10-CM | POA: Insufficient documentation

## 2012-02-09 DIAGNOSIS — I10 Essential (primary) hypertension: Secondary | ICD-10-CM | POA: Insufficient documentation

## 2012-02-09 DIAGNOSIS — Z96649 Presence of unspecified artificial hip joint: Secondary | ICD-10-CM | POA: Insufficient documentation

## 2012-02-09 MED ORDER — OXYCODONE-ACETAMINOPHEN 5-325 MG PO TABS: 1.0000 | ORAL_TABLET | Freq: Four times a day (QID) | ORAL | Status: DC | PRN

## 2012-02-09 MED ORDER — METHOCARBAMOL 500 MG PO TABS: 500.0000 mg | ORAL_TABLET | Freq: Four times a day (QID) | ORAL | Status: DC

## 2012-02-09 MED ORDER — TRAMADOL HCL 50 MG PO TABS: 50.0000 mg | ORAL_TABLET | Freq: Four times a day (QID) | ORAL | Status: AC | PRN

## 2012-02-09 NOTE — Telephone Encounter (Signed)
Pt is trying to fill percocet early and the pharmacy wont let him so he would like something else for pain.  Advised pt he was to take the percocet PRN not daily and he said he was not aware of that.  Is there something we can do for him?  He needs something for pain he says.

## 2012-02-09 NOTE — Patient Instructions (Signed)
Work on your range of motion and technique with therapies

## 2012-02-09 NOTE — Telephone Encounter (Signed)
We could call in ultram for now.  50mg  one q6 prn #40 0 rf

## 2012-02-09 NOTE — Telephone Encounter (Signed)
Pt informed of medication

## 2012-02-09 NOTE — Progress Notes (Signed)
Subjective:    Patient ID: [name], male    DOB: January 19, 1946, 66 y.o.   MRN: [id number]  HPI [name] is back regarding his cervical stenosis and myelopathy. PT has been coming out to the house working on strength and balance. OT has just started coming to the house as well working UE strength, rom, and ADL"s. They are looking at fitting him with a resting WHO.   He continues to wear his left knee cage especially when he's walking outside of his apt. In the house, he's walking without it.   From a pain standpoint he's doing fairly well. He uses his percocet and robaxin. He uses both about 4 x per day.   His bladder is emptying well. He is using dulcolax and MOM to manage his bowels. He tends to be constipated  Pain Inventory Average Pain 4 Pain Right Now 2 My pain is aching  In the last 24 hours, has pain interfered with the following? General activity 2 Relation with others 0 Enjoyment of life 3 What TIME of day is your pain at its worst? evening Sleep (in general) Fair  Pain is worse with: some activites Pain improves with: medication Relief from Meds: 7  Mobility use a walker ability to climb steps?  yes do you drive?  no transfers alone  Function I need assistance with the following:  household duties  Neuro/Psych numbness  Prior Studies Any changes since last visit?  no  Physicians involved in your care Any changes since last visit?  no   Family History  Problem Relation Age of Onset  . Anesthesia problems Neg Hx    History   Social History  . Marital Status: Legally Separated    Spouse Name: N/A    Number of Children: N/A  . Years of Education: N/A   Social History Main Topics  . Smoking status: Current Everyday Smoker -- 1.0 packs/day for 35 years    Types: Cigarettes    Last Attempt to Quit: 07/19/1988  . Smokeless tobacco: Former Neurosurgeon  . Alcohol Use: Yes     None since January 21 1979  . Drug Use: No  . Sexually Active: Not Currently    Other Topics Concern  . None   Social History Narrative  . None   Past Surgical History  Procedure Date  . Anterior cervical decomp/discectomy fusion 2008  . Posterior fusion cervical spine 01/04/12  . Joint replacement     Left Hip Replacement  . Total hip arthroplasty 1999    left  . Excisional hemorrhoidectomy 1970's  . Inguinal hernia repair ~ 1975    left  . Posterior cervical laminectomy 01/04/2012    Procedure: POSTERIOR CERVICAL LAMINECTOMY;  Surgeon: Clydene Fake, MD;  Location: [facility];  Service: Neurosurgery;  Laterality: N/A;  Cervical Two to Thoracic One2  Decompressive Laminectomy, Fusion Cervical Two to Thoracic One with Instrumentation  . Posterior cervical laminectomy 01/09/2012    Procedure: POSTERIOR CERVICAL LAMINECTOMY;  Surgeon: Hewitt Shorts, MD;  Location: [facility];  Service: Neurosurgery;  Laterality: N/A;  Evacuation of Epidural Hematoma   Past Medical History  Diagnosis Date  . Neuromuscular disorder   . Hypertension   . High cholesterol   . Type II diabetes mellitus   . Arthritis     "right knee; left hip"   BP 137/67  Pulse 61  Resp 16  Ht 6' (1.829 m)  Wt 172 lb 12.8 oz (78.382 kg)  BMI  23.44 kg/m2  SpO2 97%    Review of Systems  Gastrointestinal: Positive for constipation.  Musculoskeletal:       Neck pain  Neurological: Positive for numbness.  All other systems reviewed and are negative.       Objective:   Physical Exam   General: Alert and oriented x 3, No apparent distress HEENT: Head is normocephalic, atraumatic, PERRLA, EOMI, sclera anicteric, oral mucosa pink and moist, dentition intact, ext ear canals clear,  Neck: Supple without JVD or lymphadenopathy Heart: Reg rate and rhythm. No murmurs rubs or gallops Chest: CTA bilaterally without wheezes, rales, or rhonchi; no distress Abdomen: Soft, non-tender, non-distended, bowel sounds positive. Extremities: No clubbing, cyanosis, or edema. Pulses are  2+ Skin: Clean and intact without signs of breakdown Neuro: Pt is cognitively appropriate with normal insight, memory, and awareness. Cranial nerves 2-12 are intact. Sensory exam is 1/2l. Reflexes are 2++ on the left.  LUE 3 prox to 0 distally. LLE is 3-4.   Musculoskeletal: severe valgus deformity of the right knee especially when walking. Left knee goes into recurvatum sligthtly with brace Psych: Pt's affect is appropriate. Pt is cooperative         1. Cervical myelopathy. Status post C2-C7 laminectomy and fusion 01/04/2012 with postoperative epidural hematoma evacuation 01/09/2012  -continue with home health therapies- would like to transition to outpt therapies eventually -may do well with a kick plate eventually as well for his shoes, knee cage looks to be helping   -rx'ed percocet and robaxin (tore up previous script from [facility]) with the expectation of tapering these meds, especially the oxycodone. 2.. Pain Management:   Robaxin and Percocet as needed. Wean as able, especially the percoet.  3. Will order a resting WHO for left upper extremity 5. Hypertension. Good control 6. Diabetes mellitus. Continue home control --sugars normalizing.  7. Hyperlipidemia. Zocor  8. Neurogenic bowel: augmented bowel regimen- seeking qod movements-  -discussed options for medications  -would rather that he be proactive rather than reactive with medications 9. I'lll see him back in 2 months. All questions were encoraged and answered.

## 2012-02-09 NOTE — Telephone Encounter (Signed)
Pt called with issue getting his medication fill.  He states he is in a lot of pain and needs his medication, even if it needs to be changed to a different kind.  Please advise.

## 2012-04-12 ENCOUNTER — Encounter: Payer: Medicare Other | Attending: Physical Medicine & Rehabilitation | Admitting: Physical Medicine & Rehabilitation

## 2012-04-12 ENCOUNTER — Encounter: Payer: Self-pay | Admitting: Physical Medicine & Rehabilitation

## 2012-04-12 VITALS — BP 146/84 | HR 60 | Resp 16 | Ht 70.0 in | Wt 186.0 lb

## 2012-04-12 DIAGNOSIS — M4712 Other spondylosis with myelopathy, cervical region: Secondary | ICD-10-CM | POA: Insufficient documentation

## 2012-04-12 DIAGNOSIS — IMO0002 Reserved for concepts with insufficient information to code with codable children: Secondary | ICD-10-CM | POA: Insufficient documentation

## 2012-04-12 DIAGNOSIS — M1711 Unilateral primary osteoarthritis, right knee: Secondary | ICD-10-CM

## 2012-04-12 DIAGNOSIS — K592 Neurogenic bowel, not elsewhere classified: Secondary | ICD-10-CM

## 2012-04-12 DIAGNOSIS — M1710 Unilateral primary osteoarthritis, unspecified knee: Secondary | ICD-10-CM

## 2012-04-12 MED ORDER — TRAMADOL HCL 50 MG PO TABS: 50.0000 mg | ORAL_TABLET | Freq: Four times a day (QID) | ORAL | Status: DC | PRN

## 2012-04-12 NOTE — Patient Instructions (Addendum)
CONTINUE WORKING ON ACTIVE AND PASSIVE RANGE OF MOTION IN YOUR LEFT ARM,HAND, WRIST.   WE CAN CONSIDER OUTPT THERAPY NEXT [name]

## 2012-04-12 NOTE — Progress Notes (Signed)
Subjective:    Patient ID: [name], male    DOB: 15-Jul-1946, 66 y.o.   MRN: [id number]  HPI  [name] is back regarding his cervical myelopathy. He has finished HH therapies. He went for an outpt therapy evalutaion and was nearing the end of his medicare visits. OT also felt there wasn't much new they could work on given his physical exam, so they decided to hold off for a while.  His pain has generally been improving although he has some general pain in the righ knee still and also in the neck. He has tapered down to tramadol for pain which generally seems to take the "edge" off things. His bowel program has been regular.  Pain Inventory Average Pain 4 Pain Right Now 0 My pain is sharp  In the last 24 hours, has pain interfered with the following? General activity 3 Relation with others 2 Enjoyment of life 2 What TIME of day is your pain at its worst? night Sleep (in general) Fair  Pain is worse with: some activites Pain improves with: medication Relief from Meds: 3  Mobility walk with assistance use a walker ability to climb steps?  yes do you drive?  yes  Function I need assistance with the following:  household duties  Neuro/Psych No problems in this area  Prior Studies Any changes since last visit?  no  Physicians involved in your care Any changes since last visit?  no   Family History  Problem Relation Age of Onset  . Anesthesia problems Neg Hx    History   Social History  . Marital Status: Legally Separated    Spouse Name: N/A    Number of Children: N/A  . Years of Education: N/A   Social History Main Topics  . Smoking status: Former Smoker -- 1.0 packs/day for 35 years    Types: Cigarettes    Quit date: 07/19/1988  . Smokeless tobacco: Former Neurosurgeon  . Alcohol Use: Yes     None since January 21 1979  . Drug Use: No  . Sexually Active: Not Currently   Other Topics Concern  . None   Social History Narrative  . None   Past Surgical  History  Procedure Date  . Anterior cervical decomp/discectomy fusion 2008  . Posterior fusion cervical spine 01/04/12  . Joint replacement     Left Hip Replacement  . Total hip arthroplasty 1999    left  . Excisional hemorrhoidectomy 1970's  . Inguinal hernia repair ~ 1975    left  . Posterior cervical laminectomy 01/04/2012    Procedure: POSTERIOR CERVICAL LAMINECTOMY;  Surgeon: Clydene Fake, MD;  Location: [facility];  Service: Neurosurgery;  Laterality: N/A;  Cervical Two to Thoracic One2  Decompressive Laminectomy, Fusion Cervical Two to Thoracic One with Instrumentation  . Posterior cervical laminectomy 01/09/2012    Procedure: POSTERIOR CERVICAL LAMINECTOMY;  Surgeon: Hewitt Shorts, MD;  Location: [facility];  Service: Neurosurgery;  Laterality: N/A;  Evacuation of Epidural Hematoma   Past Medical History  Diagnosis Date  . Neuromuscular disorder   . Hypertension   . High cholesterol   . Type II diabetes mellitus   . Arthritis     "right knee; left hip"   BP 146/84  Pulse 60  Resp 16  Ht 5\' 10"  (1.778 m)  Wt 186 lb (84.369 kg)  BMI 26.69 kg/m2  SpO2 96%     Review of Systems  Constitutional: Negative.   HENT: Positive  for neck pain.   Eyes: Negative.   Respiratory: Negative.   Cardiovascular: Negative.   Gastrointestinal: Negative.   Genitourinary: Negative.   Skin: Negative.   Neurological: Negative.   Psychiatric/Behavioral: Negative.        Objective:   Physical Exam  General: Alert and oriented x 3, No apparent distress  HEENT: Head is normocephalic, atraumatic, PERRLA, EOMI, sclera anicteric, oral mucosa pink and moist, dentition intact, ext ear canals clear,  Neck: Supple without JVD or lymphadenopathy  Heart: Reg rate and rhythm. No murmurs rubs or gallops  Chest: CTA bilaterally without wheezes, rales, or rhonchi; no distress  Abdomen: Soft, non-tender, non-distended, bowel sounds positive.  Extremities: No clubbing, cyanosis, or edema.  Pulses are 2+  Skin: Clean and intact without signs of breakdown  Neuro: Pt is cognitively appropriate with normal insight, memory, and awareness. Cranial nerves 2-12 are intact. Sensory exam is 1/2l. Reflexes are 2++ on the left. LLE is grossly 4/5. LUE is 3 to 3+ prox at deltoid, biceps, triceps, trace with FF, 0/5 with WF,WE. He walks with a scissoring gait. Musculoskeletal: severe valgus deformity of the right knee especially when walking. Left knee goes into recurvatum sligthtly with brace  Psych: Pt's affect is appropriate. Pt is cooperative     1. Cervical myelopathy. Status post C2-C7 laminectomy and fusion 01/04/2012 with postoperative epidural hematoma evacuation 01/09/2012  -continue with HEP, consider transition again to outpt in the new year when he has coverage and some specific motor function to capture  -He has made nice gains overall -rx'ed tramadol for breakthrough pain. Needs to address appropriate posture   2. Continue with WHO at night and day time spling  5. Hypertension. Good control  6. Diabetes mellitus. Continue home control  --sugars normalizing.  7. Hyperlipidemia. Zocor  8. Neurogenic bowel: bowel regimen is effective  9. I'lll see him back in 4 months.

## 2012-05-09 ENCOUNTER — Other Ambulatory Visit: Payer: Self-pay | Admitting: Physical Medicine & Rehabilitation

## 2012-07-21 ENCOUNTER — Other Ambulatory Visit: Payer: Self-pay | Admitting: *Deleted

## 2012-07-21 MED ORDER — METHOCARBAMOL 500 MG PO TABS: 500.0000 mg | ORAL_TABLET | Freq: Four times a day (QID) | ORAL | Status: DC

## 2012-08-01 ENCOUNTER — Encounter: Payer: Medicare Other | Attending: Physical Medicine & Rehabilitation | Admitting: Physical Medicine & Rehabilitation

## 2012-08-16 ENCOUNTER — Telehealth: Payer: Self-pay

## 2012-08-16 MED ORDER — TRAMADOL HCL 50 MG PO TABS: 50.0000 mg | ORAL_TABLET | Freq: Four times a day (QID) | ORAL | Status: DC | PRN

## 2012-08-16 NOTE — Telephone Encounter (Signed)
Patient called to refill tramadol.  Refill given and sent to [facility] drug.  Patient aware.

## 2012-08-21 IMAGING — CR DG CHEST 2V
2 series · 2 of 2 positions shown · non-contrast
Comparison: 02/25/2006.

CLINICAL DATA: Preoperative evaluation.  History of hypertension.
History of smoking.

CHEST - 2 VIEW

[view not recorded (1 of 2)]
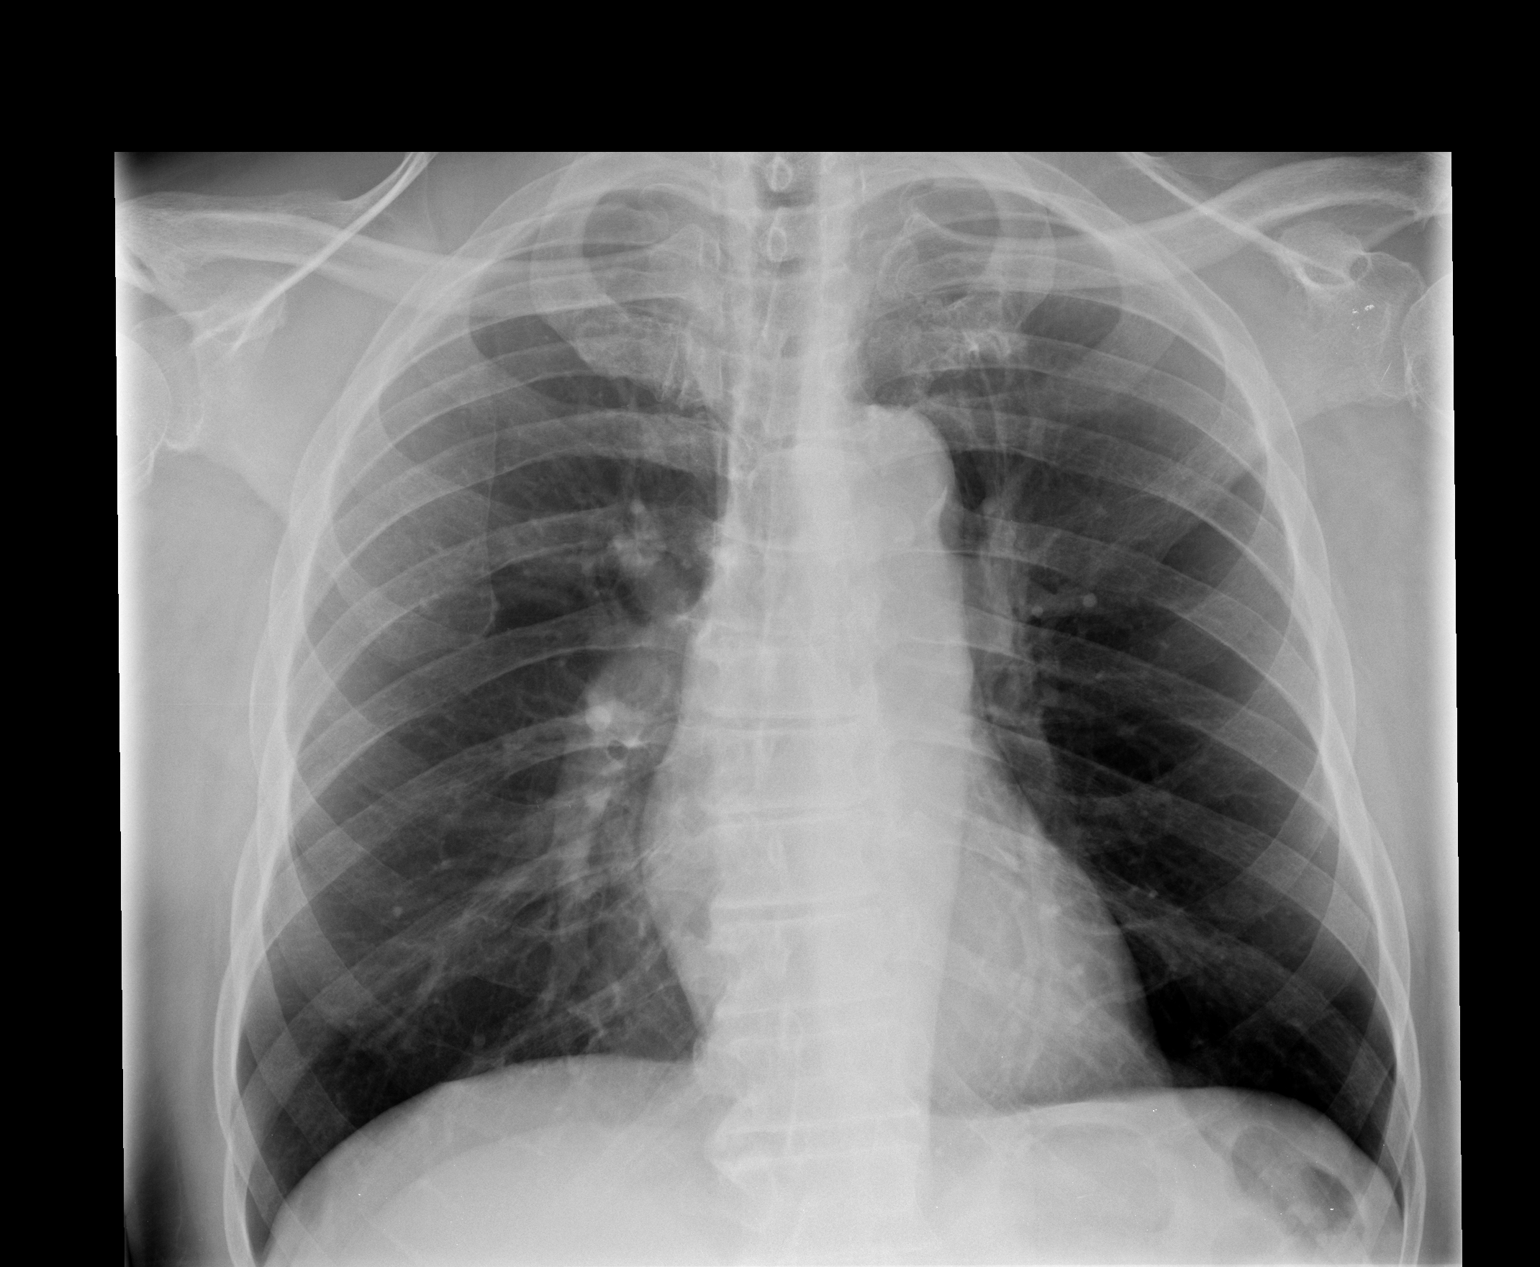

[view not recorded (2 of 2)]
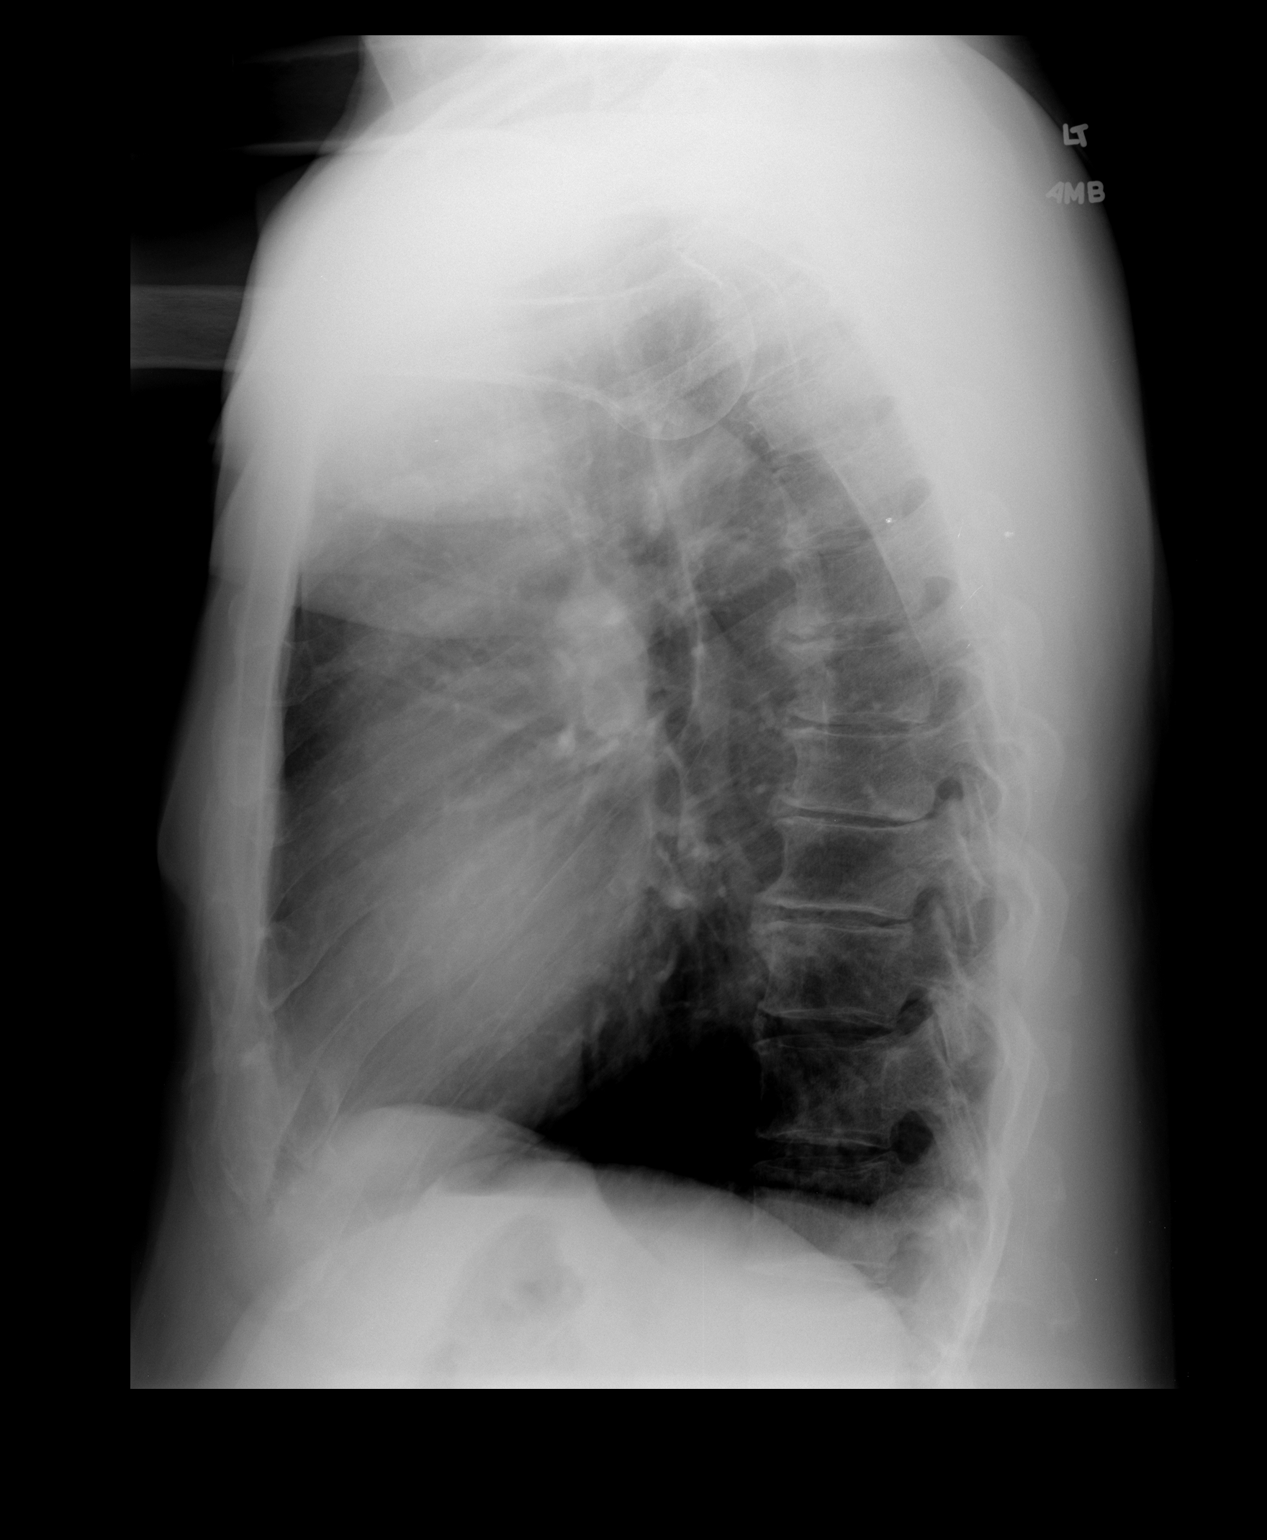

[2 of 2 positions shown; findings below may reference images not displayed]

FINDINGS: The cardiac silhouette is normal size and shape. Ectasia
and nonaneurysmal calcification of the thoracic aorta are seen.
Mediastinal and hilar contours appear stable.  No pulmonary
infiltrates or nodules were evident. There is flattening of the
diaphragm on lateral image consistent with overall hyperinflation
configuration associated with COPD.  No pleural abnormality is
evident.  Osteophytes are present in the spine with changes of
degenerative disc disease and degenerative spondylosis..
IMPRESSION: Hyperinflation configuration consistent with COPD.  No acute
superimposed abnormality is evident.

## 2012-08-22 ENCOUNTER — Encounter: Payer: Medicare Other | Attending: Physical Medicine & Rehabilitation | Admitting: Physical Medicine & Rehabilitation

## 2012-08-22 ENCOUNTER — Encounter: Payer: Self-pay | Admitting: Physical Medicine & Rehabilitation

## 2012-08-22 ENCOUNTER — Telehealth: Payer: Self-pay

## 2012-08-22 VITALS — BP 150/90 | HR 58 | Resp 14 | Ht 72.0 in | Wt 200.0 lb

## 2012-08-22 DIAGNOSIS — M1711 Unilateral primary osteoarthritis, right knee: Secondary | ICD-10-CM

## 2012-08-22 DIAGNOSIS — I10 Essential (primary) hypertension: Secondary | ICD-10-CM | POA: Insufficient documentation

## 2012-08-22 DIAGNOSIS — Z981 Arthrodesis status: Secondary | ICD-10-CM | POA: Insufficient documentation

## 2012-08-22 DIAGNOSIS — M4712 Other spondylosis with myelopathy, cervical region: Secondary | ICD-10-CM

## 2012-08-22 DIAGNOSIS — M1710 Unilateral primary osteoarthritis, unspecified knee: Secondary | ICD-10-CM | POA: Insufficient documentation

## 2012-08-22 DIAGNOSIS — E785 Hyperlipidemia, unspecified: Secondary | ICD-10-CM | POA: Insufficient documentation

## 2012-08-22 DIAGNOSIS — K592 Neurogenic bowel, not elsewhere classified: Secondary | ICD-10-CM | POA: Insufficient documentation

## 2012-08-22 DIAGNOSIS — E119 Type 2 diabetes mellitus without complications: Secondary | ICD-10-CM | POA: Insufficient documentation

## 2012-08-22 NOTE — Patient Instructions (Signed)
WORK ON RANGE OF MOTION AND MASSAGE TO YOUR LEFT HAND/ARM.   CHECK ON OTHER OPTIONS FOR MUSCLE RELAXANTS WHICH MIGHT BE MORE AFFORDABLE FOR YOU

## 2012-08-22 NOTE — Telephone Encounter (Signed)
Patient called to say that his insurance will cover meloxicam 15mg  instead of methocarbamol.  Patient says this was discussed at last visit.  Please advise.

## 2012-08-22 NOTE — Telephone Encounter (Signed)
meloxicam ain't a muscle relaxant! i asked him to check to see if another muscle relaxant was covered, not a drug that started with the same letter.

## 2012-08-22 NOTE — Progress Notes (Signed)
Subjective:    Patient ID: [name], male    DOB: 1946-03-09, 67 y.o.   MRN: [id number]  HPI  [name] is back regarding his cervical myelopathy. He reports that not a lot has changed since i last saw him. He wears his spint at day and a splint at night. He hasn't gained noticeable strength back in the LUE. He is walking with his walker, but his right knee pain tends to be limiting. He often feels "popping" in the right knee depending upon his position. Often the popping will relieve the pain.   He is going on a trip in April and is worried about how his knee pain might limit him.  Bowel and bladder function are intact. He continues on tramadol for baseline pain control.  Pain Inventory Average Pain 5 Pain Right Now 4 My pain is intermittent and sharp  In the last 24 hours, has pain interfered with the following? General activity 6 Relation with others 5 Enjoyment of life 5 What TIME of day is your pain at its worst? night Sleep (in general) Fair  Pain is worse with: walking and some activites Pain improves with: medication Relief from Meds: 8  Mobility walk with assistance use a cane use a walker how many minutes can you walk? 20-30 do you drive?  yes transfers alone Do you have any goals in this area?  yes  Function retired Do you have any goals in this area?  yes  Neuro/Psych numbness tingling trouble walking spasms  Prior Studies Any changes since last visit?  no  Physicians involved in your care Primary care  Neurosurgeon  Dr Yetta Barre, Dr Chong Sicilian   Family History  Problem Relation Age of Onset  . Anesthesia problems Neg Hx    History   Social History  . Marital Status: Legally Separated    Spouse Name: N/A    Number of Children: N/A  . Years of Education: N/A   Social History Main Topics  . Smoking status: Former Smoker -- 1.0 packs/day for 35 years    Types: Cigarettes    Quit date: 07/19/1988  . Smokeless tobacco: Former Neurosurgeon  .  Alcohol Use: Yes     Comment: None since January 21 1979  . Drug Use: No  . Sexually Active: Not Currently   Other Topics Concern  . None   Social History Narrative  . None   Past Surgical History  Procedure Date  . Anterior cervical decomp/discectomy fusion 2008  . Posterior fusion cervical spine 01/04/12  . Joint replacement     Left Hip Replacement  . Total hip arthroplasty 1999    left  . Excisional hemorrhoidectomy 1970's  . Inguinal hernia repair ~ 1975    left  . Posterior cervical laminectomy 01/04/2012    Procedure: POSTERIOR CERVICAL LAMINECTOMY;  Surgeon: Clydene Fake, MD;  Location: [facility];  Service: Neurosurgery;  Laterality: N/A;  Cervical Two to Thoracic One2  Decompressive Laminectomy, Fusion Cervical Two to Thoracic One with Instrumentation  . Posterior cervical laminectomy 01/09/2012    Procedure: POSTERIOR CERVICAL LAMINECTOMY;  Surgeon: Hewitt Shorts, MD;  Location: [facility];  Service: Neurosurgery;  Laterality: N/A;  Evacuation of Epidural Hematoma   Past Medical History  Diagnosis Date  . Neuromuscular disorder   . Hypertension   . High cholesterol   . Type II diabetes mellitus   . Arthritis     "right knee; left hip"   BP 150/90  Pulse  58  Resp 14  Ht 6' (1.829 m)  Wt 200 lb (90.719 kg)  BMI 27.12 kg/m2  SpO2 98%     Review of Systems  Musculoskeletal: Positive for gait problem.  Neurological: Positive for numbness.  All other systems reviewed and are negative.       Objective:   Physical Exam General: Alert and oriented x 3, No apparent distress  HEENT: Head is normocephalic, atraumatic, PERRLA, EOMI, sclera anicteric, oral mucosa pink and moist, dentition intact, ext ear canals clear,  Neck: Supple without JVD or lymphadenopathy  Heart: Reg rate and rhythm. No murmurs rubs or gallops  Chest: CTA bilaterally without wheezes, rales, or rhonchi; no distress  Abdomen: Soft, non-tender, non-distended, bowel sounds positive.   Extremities: No clubbing, cyanosis, or edema. Pulses are 2+  Skin: Clean and intact without signs of breakdown  Neuro: Pt is cognitively appropriate with normal insight, memory, and awareness. Cranial nerves 2-12 are intact. Sensory exam is 1/2. Reflexes are 2++ on the left. LLE is grossly 4/5. LUE is   3+ prox at deltoid, biceps, triceps, trace to 1 with FF, 0 to trace /5 with WF,WE. He walks with less of a scissoring gait.  Musculoskeletal: severe valgus deformity of the right knee especially when walking. Left knee goes into recurvatum sligthtly with standing. Right knee displays general laxity with PROM. He doesn't appear to be at risk of falling. He has contractures of the MCP's and PIP's of the left hand.  Psych: Pt's affect is appropriate. Pt is cooperative    1. Cervical myelopathy. Status post C2-C7 laminectomy and fusion 01/04/2012 with postoperative epidural hematoma evacuation 01/09/2012  -continue with HEP -He has made nice gains overall. Motor return has slowed, but there is some signs of improvement since September. -continue tramadol for breakthrough pain. -consider tenodesis splint if contractures improve-discussed stretching exercises today 2. Continue with WHO at night and day time spling  3. OA of right knee- potentially inject knee at next visit as knee pain remains a problem with gait and at night.   -continue with knee bracing for stability 5. Hypertension. Good control  6. Diabetes mellitus. Continue home control  --sugars normalizing.  7. Hyperlipidemia. Zocor  8. Neurogenic bowel: bowel regimen is effective  9. I'lll see him back in about 8 weeks.  15 minutes of face to face patient care time were spent during this visit. All questions were encouraged and answered.

## 2012-08-23 ENCOUNTER — Telehealth: Payer: Self-pay | Admitting: *Deleted

## 2012-08-23 NOTE — Telephone Encounter (Signed)
[name] says Tizanidine is covered by his plan instead of the methocarbamol.

## 2012-08-23 NOTE — Telephone Encounter (Signed)
Reviewed a few muscle relaxers for [name] to see if his insurance will approve.  He realized after his first call that meloxicam is not a muscle relaxer,

## 2012-08-23 NOTE — Telephone Encounter (Signed)
Left VM to [facility] 

## 2012-08-24 MED ORDER — TIZANIDINE HCL 2 MG PO TABS: 2.0000 mg | ORAL_TABLET | Freq: Four times a day (QID) | ORAL | Status: DC | PRN

## 2012-08-24 NOTE — Telephone Encounter (Signed)
i have written and E-Rx for zanaflex

## 2012-09-02 IMAGING — CR DG CHEST 1V PORT
1 series · 1 of 1 positions shown · non-contrast
Comparison: 12/28/2011.

CLINICAL DATA: 66-year-old male respiratory failure, intubated.

PORTABLE CHEST - 1 VIEW

[AP]
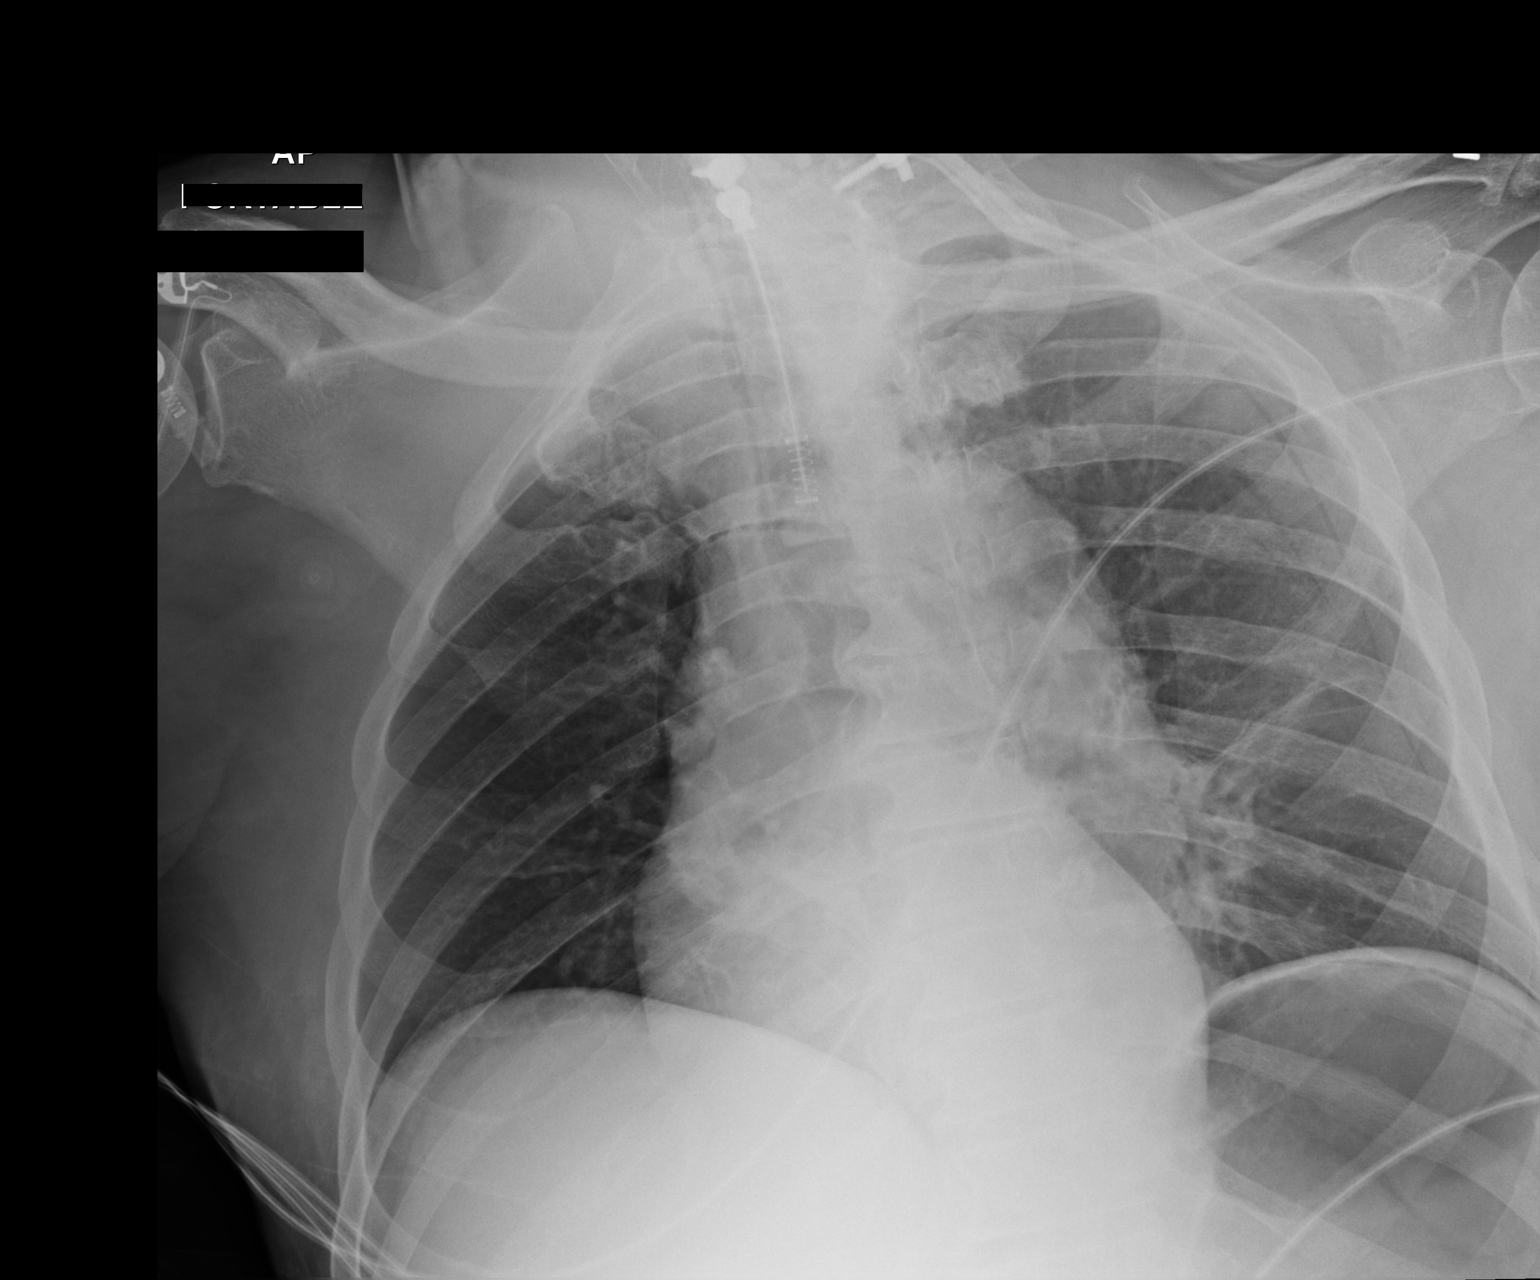

[1 of 1 positions shown; findings below may reference images not displayed]

FINDINGS: AP portable semi upright view 3993 hours.  Endotracheal
tube tip projects over the tracheal air column between level of
clavicles and carina, about 5 cm proximal to the carina.  Lower
cervical anterior and posterior fusion hardware partially visible
with overlying skin staples.  The patient is rotated to the right.
Lower lung volumes.  Grossly stable mediastinal contours.  No
pneumothorax, pulmonary edema, pleural effusion or consolidation
identified.  Gas in the gastric fundus.
IMPRESSION: 1.  Endotracheal tube in good position, tip between level of
clavicles and carina.
2.  Rotated.  Lower lung volumes.
3.  Interval postoperative changes to the cervical spine.

## 2012-10-17 ENCOUNTER — Ambulatory Visit: Payer: Medicare Other | Admitting: Physical Medicine & Rehabilitation

## 2012-11-06 ENCOUNTER — Encounter: Payer: Medicare Other | Attending: Physical Medicine & Rehabilitation | Admitting: Physical Medicine & Rehabilitation

## 2012-11-06 ENCOUNTER — Encounter: Payer: Self-pay | Admitting: Physical Medicine & Rehabilitation

## 2012-11-06 VITALS — BP 132/84 | HR 86 | Resp 14 | Ht 72.0 in | Wt 197.2 lb

## 2012-11-06 DIAGNOSIS — I10 Essential (primary) hypertension: Secondary | ICD-10-CM | POA: Insufficient documentation

## 2012-11-06 DIAGNOSIS — M1710 Unilateral primary osteoarthritis, unspecified knee: Secondary | ICD-10-CM | POA: Insufficient documentation

## 2012-11-06 DIAGNOSIS — M4712 Other spondylosis with myelopathy, cervical region: Secondary | ICD-10-CM | POA: Insufficient documentation

## 2012-11-06 DIAGNOSIS — M1711 Unilateral primary osteoarthritis, right knee: Secondary | ICD-10-CM

## 2012-11-06 DIAGNOSIS — E785 Hyperlipidemia, unspecified: Secondary | ICD-10-CM | POA: Insufficient documentation

## 2012-11-06 DIAGNOSIS — E119 Type 2 diabetes mellitus without complications: Secondary | ICD-10-CM | POA: Insufficient documentation

## 2012-11-06 DIAGNOSIS — K592 Neurogenic bowel, not elsewhere classified: Secondary | ICD-10-CM | POA: Insufficient documentation

## 2012-11-06 DIAGNOSIS — Z981 Arthrodesis status: Secondary | ICD-10-CM | POA: Insufficient documentation

## 2012-11-06 MED ORDER — TIZANIDINE HCL 2 MG PO TABS: 2.0000 mg | ORAL_TABLET | Freq: Four times a day (QID) | ORAL | Status: DC | PRN

## 2012-11-06 MED ORDER — TRAMADOL HCL 50 MG PO TABS: 50.0000 mg | ORAL_TABLET | Freq: Four times a day (QID) | ORAL | Status: DC | PRN

## 2012-11-06 NOTE — Progress Notes (Signed)
       KNEE INJECTION/MAJOR JOINT  After informed consent and preparation of the skin with betadine, I injected 40mg  methylprednisolone and 4cc of 1% lidocaine into the right knee via lateral approach. The patient tolerated well, and no complications were encountered. Afterward the area was cleaned and dressed. Post- injection instructions were provided.   Additionally I refilled his zanaflex and ultram today. I will see him back in about 3 months.

## 2012-11-06 NOTE — Patient Instructions (Signed)
Knee Injection  Joint injections are shots. Your caregiver will place a needle into your knee joint. The needle is used to put medicine into the joint. These shots can be used to help treat different painful knee conditions such as osteoarthritis, bursitis, local flare-ups of rheumatoid arthritis, and pseudogout. Anti-inflammatory medicines such as corticosteroids and anesthetics are the most common medicines used for joint and soft tissue injections.   PROCEDURE  · The skin over the kneecap will be cleaned with an antiseptic solution.  · Your caregiver will inject a small amount of a local anesthetic (a medicine like Novocaine) just under the skin in the area that was cleaned.  · After the area becomes numb, a second injection is done. This second injection usually includes an anesthetic and an anti-inflammatory medicine called a steroid or cortisone. The needle is carefully placed in between the kneecap and the knee, and the medicine is injected into the joint space.  · After the injection is done, the needle is removed. Your caregiver may place a bandage over the injection site. The whole procedure takes no more than a couple of minutes.  BEFORE THE PROCEDURE   Wash all of the skin around the entire knee area. Try to remove any loose, scaling skin. There is no other specific preparation necessary unless advised otherwise by your caregiver.  LET YOUR CAREGIVER KNOW ABOUT:   · Allergies.  · Medications taken including herbs, eye drops, over the counter medications, and creams.  · Use of steroids (by mouth or creams).  · Possible pregnancy, if applicable.  · Previous problems with anesthetics or Novocaine.  · History of blood clots (thrombophlebitis).  · History of bleeding or blood problems.  · Previous surgery.  · Other health problems.  RISKS AND COMPLICATIONS  Side effects from cortisone shots are rare. They include:   · Slight bruising of the skin.  · Shrinkage of the normal fatty tissue under the skin where  the shot was given.  · Increase in pain after the shot.  · Infection.  · Weakening of tendons or tendon rupture.  · Allergic reaction to the medicine.  · Diabetics may have a temporary increase in their blood sugar after a shot.  · Cortisone can temporarily weaken the immune system. While receiving these shots, you should not get certain vaccines. Also, avoid contact with anyone who has chickenpox or measles. Especially if you have never had these diseases or have not been previously immunized. Your immune system may not be strong enough to fight off the infection while the cortisone is in your system.  AFTER THE PROCEDURE   · You can go home after the procedure.  · You may need to put ice on the joint 15 to 20 minutes every 3 or 4 hours until the pain goes away.  · You may need to put an elastic bandage on the joint.  HOME CARE INSTRUCTIONS   · Only take over-the-counter or prescription medicines for pain, discomfort, or fever as directed by your caregiver.  · You should avoid stressing the joint. Unless advised otherwise, avoid activities that put a lot of pressure on a knee joint, such as:  · Jogging.  · Bicycling.  · Recreational climbing.  · Hiking.  · Laying down and elevating the leg/knee above the level of your heart can help to minimize swelling.  SEEK MEDICAL CARE IF:   · You have repeated or worsening swelling.  · There is drainage from the puncture area.  ·   You develop red streaking that extends above or below the site where the needle was inserted.  SEEK IMMEDIATE MEDICAL CARE IF:   · You develop a fever.  · You have pain that gets worse even though you are taking pain medicine.  · The area is red and warm, and you have trouble moving the joint.  MAKE SURE YOU:   · Understand these instructions.  · Will watch your condition.  · Will get help right away if you are not doing well or get worse.  Document Released: 09/26/2006 Document Revised: 09/27/2011 Document Reviewed: 06/23/2007  [employer]® Patient  Information ©2013 [employer].

## 2013-01-31 ENCOUNTER — Encounter: Payer: Self-pay | Admitting: Physical Medicine & Rehabilitation

## 2013-01-31 ENCOUNTER — Encounter: Payer: Medicare Other | Attending: Physical Medicine & Rehabilitation | Admitting: Physical Medicine & Rehabilitation

## 2013-01-31 VITALS — BP 149/76 | HR 84 | Resp 14 | Ht 72.0 in | Wt 198.6 lb

## 2013-01-31 DIAGNOSIS — E119 Type 2 diabetes mellitus without complications: Secondary | ICD-10-CM | POA: Insufficient documentation

## 2013-01-31 DIAGNOSIS — I10 Essential (primary) hypertension: Secondary | ICD-10-CM | POA: Insufficient documentation

## 2013-01-31 DIAGNOSIS — M4712 Other spondylosis with myelopathy, cervical region: Secondary | ICD-10-CM

## 2013-01-31 DIAGNOSIS — Z981 Arthrodesis status: Secondary | ICD-10-CM | POA: Insufficient documentation

## 2013-01-31 DIAGNOSIS — E785 Hyperlipidemia, unspecified: Secondary | ICD-10-CM | POA: Insufficient documentation

## 2013-01-31 DIAGNOSIS — K592 Neurogenic bowel, not elsewhere classified: Secondary | ICD-10-CM | POA: Insufficient documentation

## 2013-01-31 DIAGNOSIS — M1710 Unilateral primary osteoarthritis, unspecified knee: Secondary | ICD-10-CM | POA: Insufficient documentation

## 2013-01-31 DIAGNOSIS — M1711 Unilateral primary osteoarthritis, right knee: Secondary | ICD-10-CM

## 2013-01-31 MED ORDER — TRAMADOL HCL 50 MG PO TABS
50.0000 mg | ORAL_TABLET | Freq: Four times a day (QID) | ORAL | Status: DC | PRN
Start: 2013-01-31 — End: 2013-06-01

## 2013-01-31 NOTE — Patient Instructions (Signed)
CALL ME WITH ANY PROBLEMS OR QUESTIONS (#[phone]).  HAVE A GOOD DAY  

## 2013-01-31 NOTE — Progress Notes (Signed)
Subjective:    Patient ID: [name], male    DOB: 1946-07-07, 67 y.o.   MRN: [id number]  HPI  [name] is back regarding his cervical myelopathy. The injection we performed on his right knee in April helped him on his trip and lasted for about 2-3 weeks.   He is walking with his walker for longer dx and uses his cane around the house.   He has noticed some improvement in his left hand since I last saw him with respect to strength and sensation.   He is taking 3 tramadol a day on avg. He still takes his voltaren tablets as well.     Pain Inventory Average Pain 4 Pain Right Now 3 My pain is intermittent and dull  In the last 24 hours, has pain interfered with the following? General activity 4 Relation with others 1 Enjoyment of life 4 What TIME of day is your pain at its worst? daytime Sleep (in general) Fair  Pain is worse with: walking and standing Pain improves with: rest and medication Relief from Meds: 7  Mobility walk with assistance use a walker how many minutes can you walk? 15-30  Function disabled: date disabled 2012 retired I need assistance with the following:  household duties  Neuro/Psych numbness tingling trouble walking spasms  Prior Studies Any changes since last visit?  no  Physicians involved in your care Any changes since last visit?  no   Family History  Problem Relation Age of Onset  . Anesthesia problems Neg Hx    History   Social History  . Marital Status: Legally Separated    Spouse Name: N/A    Number of Children: N/A  . Years of Education: N/A   Social History Main Topics  . Smoking status: Former Smoker -- 1.00 packs/day for 35 years    Types: Cigarettes    Quit date: 07/19/1988  . Smokeless tobacco: Former Neurosurgeon  . Alcohol Use: Yes     Comment: None since January 21 1979  . Drug Use: No  . Sexually Active: Not Currently   Other Topics Concern  . None   Social History Narrative  . None   Past Surgical  History  Procedure Laterality Date  . Anterior cervical decomp/discectomy fusion  2008  . Posterior fusion cervical spine  01/04/12  . Joint replacement      Left Hip Replacement  . Total hip arthroplasty  1999    left  . Excisional hemorrhoidectomy  1970's  . Inguinal hernia repair  ~ 1975    left  . Posterior cervical laminectomy  01/04/2012    Procedure: POSTERIOR CERVICAL LAMINECTOMY;  Surgeon: Clydene Fake, MD;  Location: [facility];  Service: Neurosurgery;  Laterality: N/A;  Cervical Two to Thoracic One2  Decompressive Laminectomy, Fusion Cervical Two to Thoracic One with Instrumentation  . Posterior cervical laminectomy  01/09/2012    Procedure: POSTERIOR CERVICAL LAMINECTOMY;  Surgeon: Hewitt Shorts, MD;  Location: [facility];  Service: Neurosurgery;  Laterality: N/A;  Evacuation of Epidural Hematoma   Past Medical History  Diagnosis Date  . Neuromuscular disorder   . Hypertension   . High cholesterol   . Type II diabetes mellitus   . Arthritis     "right knee; left hip"   BP 149/76  Pulse 84  Resp 14  Ht 6' (1.829 m)  Wt 198 lb 9.6 oz (90.084 kg)  BMI 26.93 kg/m2  SpO2 95%   Review of Systems  Musculoskeletal: Positive for gait problem.       Spasms  Neurological: Positive for numbness.       Tingling  All other systems reviewed and are negative.       Objective:   Physical Exam  General: Alert and oriented x 3, No apparent distress  HEENT: Head is normocephalic, atraumatic, PERRLA, EOMI, sclera anicteric, oral mucosa pink and moist, dentition intact, ext ear canals clear,  Neck: Supple without JVD or lymphadenopathy  Heart: Reg rate and rhythm. No murmurs rubs or gallops  Chest: CTA bilaterally without wheezes, rales, or rhonchi; no distress  Abdomen: Soft, non-tender, non-distended, bowel sounds positive.  Extremities: No clubbing, cyanosis, or edema. Pulses are 2+  Skin: Clean and intact without signs of breakdown  Neuro: Pt is cognitively  appropriate with normal insight, memory, and awareness. Cranial nerves 2-12 are intact. Sensory exam is 1/2. Reflexes are 2++ on the left. LLE is grossly 4/5. LUE is 3+ prox at deltoid, biceps, triceps, trace 1+ with FF,   trace /5 with WF,WE. He walks with a mildly scissoring gait.  Musculoskeletal: severe valgus deformity of the right knee especially when walking. Left knee goes into recurvatum sligthtly with standing. Right knee displays general laxity with PROM. He doesn't appear to be at risk of falling. He has contractures of the MCP's and PIP's of the left hand.  Psych: Pt's affect is appropriate. Pt is cooperative    1. Cervical myelopathy. Status post C2-C7 laminectomy and fusion 01/04/2012 with postoperative epidural hematoma evacuation 01/09/2012  -continue with HEP  -He has made nice gains overall. He has had further motor and sensory recovery since I last saw him.  -tramadol was refilled today.  -continue with left spica wrist splint to assist with wrist control and finger use/flexion  2. Continue with WHO at night and day time spling  3. OA of right knee--continue with knee bracing for stability  5. Hypertension. Good control  6. Diabetes mellitus. Continue home control  7. Hyperlipidemia. Zocor  8. Neurogenic bowel: bowel regimen is effective  9. I'lll see him back in about 4 months. . 15 minutes of face to face patient care time were spent during this visit. All questions were encouraged and answered

## 2013-03-14 ENCOUNTER — Other Ambulatory Visit: Payer: Self-pay | Admitting: Physical Medicine & Rehabilitation

## 2013-06-01 ENCOUNTER — Encounter: Payer: Medicare Other | Attending: Physical Medicine & Rehabilitation | Admitting: Physical Medicine & Rehabilitation

## 2013-06-01 ENCOUNTER — Encounter: Payer: Self-pay | Admitting: Physical Medicine & Rehabilitation

## 2013-06-01 ENCOUNTER — Other Ambulatory Visit: Payer: Self-pay

## 2013-06-01 VITALS — BP 146/83 | HR 63 | Resp 14 | Ht 72.0 in | Wt 199.0 lb

## 2013-06-01 DIAGNOSIS — E119 Type 2 diabetes mellitus without complications: Secondary | ICD-10-CM | POA: Insufficient documentation

## 2013-06-01 DIAGNOSIS — I10 Essential (primary) hypertension: Secondary | ICD-10-CM | POA: Insufficient documentation

## 2013-06-01 DIAGNOSIS — K592 Neurogenic bowel, not elsewhere classified: Secondary | ICD-10-CM | POA: Insufficient documentation

## 2013-06-01 DIAGNOSIS — Z79899 Other long term (current) drug therapy: Secondary | ICD-10-CM | POA: Insufficient documentation

## 2013-06-01 DIAGNOSIS — M1711 Unilateral primary osteoarthritis, right knee: Secondary | ICD-10-CM

## 2013-06-01 DIAGNOSIS — M1710 Unilateral primary osteoarthritis, unspecified knee: Secondary | ICD-10-CM | POA: Insufficient documentation

## 2013-06-01 DIAGNOSIS — M4712 Other spondylosis with myelopathy, cervical region: Secondary | ICD-10-CM | POA: Insufficient documentation

## 2013-06-01 DIAGNOSIS — G8250 Quadriplegia, unspecified: Secondary | ICD-10-CM | POA: Insufficient documentation

## 2013-06-01 DIAGNOSIS — Z981 Arthrodesis status: Secondary | ICD-10-CM | POA: Insufficient documentation

## 2013-06-01 DIAGNOSIS — E785 Hyperlipidemia, unspecified: Secondary | ICD-10-CM | POA: Insufficient documentation

## 2013-06-01 MED ORDER — TRAMADOL HCL 50 MG PO TABS: 50.0000 mg | ORAL_TABLET | Freq: Four times a day (QID) | ORAL | Status: DC | PRN

## 2013-06-01 NOTE — Patient Instructions (Signed)
CALL ME WITH ANY PROBLEMS OR QUESTIONS (#[phone]).  HAVE A GOOD DAY  

## 2013-06-01 NOTE — Progress Notes (Signed)
Subjective:    Patient ID: [name], male    DOB: [birth year], 67 y.o.   MRN: [id number]  HPI  [name] is back regarding his cervical myelopathy and chronic knee pain. His knee continues to struggle with his right knee. The knee injection in March helped for a few weeks. He continues to wear his knee brace but it's beginning to fall apart, slides down, etc. His left WHO is also in disrepair.   He doesn't note a lot of neurological progress over the last 3 months.   He uses his cane and walker for basic support during ambulation. He reports no falls.     Pain Inventory Average Pain 4 Pain Right Now 2 My pain is sharp  In the last 24 hours, has pain interfered with the following? General activity 8 Relation with others 5 Enjoyment of life 5 What TIME of day is your pain at its worst? evening Sleep (in general) Fair  Pain is worse with: standing and some activites Pain improves with: rest, therapy/exercise and medication Relief from Meds: 7  Mobility walk with assistance use a cane use a walker how many minutes can you walk? 30-45 ability to climb steps?  yes do you drive?  yes Do you have any goals in this area?  no  Function not employed: date last employed 2011 I need assistance with the following:  household duties Do you have any goals in this area?  yes  Neuro/Psych weakness trouble walking  Prior Studies Any changes since last visit?  no  Physicians involved in your care Dr Floreen Comber   Family History  Problem Relation Age of Onset  . Anesthesia problems Neg Hx    History   Social History  . Marital Status: Legally Separated    Spouse Name: N/A    Number of Children: N/A  . Years of Education: N/A   Social History Main Topics  . Smoking status: Former Smoker -- 1.00 packs/day for 35 years    Types: Cigarettes    Quit date: 07/19/1988  . Smokeless tobacco: Former Neurosurgeon  . Alcohol Use: Yes     Comment: None since January 21 1979  . Drug Use: No   . Sexual Activity: Not Currently   Other Topics Concern  . None   Social History Narrative  . None   Past Surgical History  Procedure Laterality Date  . Anterior cervical decomp/discectomy fusion  2008  . Posterior fusion cervical spine  01/04/12  . Joint replacement      Left Hip Replacement  . Total hip arthroplasty  1999    left  . Excisional hemorrhoidectomy  1970's  . Inguinal hernia repair  ~ 1975    left  . Posterior cervical laminectomy  01/04/2012    Procedure: POSTERIOR CERVICAL LAMINECTOMY;  Surgeon: Clydene Fake, MD;  Location: [facility];  Service: Neurosurgery;  Laterality: N/A;  Cervical Two to Thoracic One2  Decompressive Laminectomy, Fusion Cervical Two to Thoracic One with Instrumentation  . Posterior cervical laminectomy  01/09/2012    Procedure: POSTERIOR CERVICAL LAMINECTOMY;  Surgeon: Hewitt Shorts, MD;  Location: [facility];  Service: Neurosurgery;  Laterality: N/A;  Evacuation of Epidural Hematoma   Past Medical History  Diagnosis Date  . Neuromuscular disorder   . Hypertension   . High cholesterol   . Type II diabetes mellitus   . Arthritis     "right knee; left hip"   BP 146/83  Pulse 63  Resp  14  Ht 6' (1.829 m)  Wt 199 lb (90.266 kg)  BMI 26.98 kg/m2  SpO2 93%     Review of Systems  Musculoskeletal: Positive for gait problem and neck pain.  Neurological: Positive for weakness.  All other systems reviewed and are negative.       Objective:   Physical Exam  General: Alert and oriented x 3, No apparent distress  HEENT: Head is normocephalic, atraumatic, PERRLA, EOMI, sclera anicteric, oral mucosa pink and moist, dentition intact, ext ear canals clear,  Neck: Supple without JVD or lymphadenopathy  Heart: Reg rate and rhythm. No murmurs rubs or gallops  Chest: CTA bilaterally without wheezes, rales, or rhonchi; no distress  Abdomen: Soft, non-tender, non-distended, bowel sounds positive.  Extremities: No clubbing, cyanosis,  or edema. Pulses are 2+  Skin: Clean and intact without signs of breakdown  Neuro: Pt is cognitively appropriate with normal insight, memory, and awareness. Cranial nerves 2-12 are intact. Sensory exam is 1/2. Reflexes are 2++ on the left. LLE is grossly 4/5. LUE is 4 prox at deltoid, biceps, 3 to 3+triceps, trace 1+ with FF, trace /5 with WF,WE. He walks with a mildly scissoring gait.  Marland Kitchen He is wearing his left wrist brace Musculoskeletal: continued severe valgus deformity of the right knee especially when walking. Left knee goes into recurvatum sligthtly with standing. Right knee displays general laxity with PROM. He doesn't appear to be at risk of falling. He has contractures of the MCP's and PIP's of the left hand.  Psych: Pt's affect is appropriate. Pt is cooperative    1. Cervical myelopathy. Status post C2-C7 laminectomy and fusion 01/04/2012 with postoperative epidural hematoma evacuation 01/09/2012   -tramadol was refilled today #60 with 3RF -continue with left spica wrist splint to assist with wrist control and finger use/flexion - wrote an rx for a replacement brace as his is in disrepair   2. OA of right knee--After informed consent and preparation of the skin with betadine and isopropyl alcohol, I injected 6mg  (1cc) of celestone and 4cc of 1% lidocaine into the right knee via lateral approach. Additionally, aspiration was performed prior to injection. The patient tolerated well, and no complications were encountered. Afterward the area was cleaned and dressed. Post- injection instructions were provided. -rx for a replacement hinged knee brace was provided -consider synvisc.  5. Hypertension. Good control  6. Diabetes mellitus. Continue home control  7. Hyperlipidemia. Zocor  8. Neurogenic bowel: bowel regimen remains appropriate 9. I'lll see him back in about 3 months. . 15 minutes of face to face patient care time were spent during this visit. All questions were encouraged and  answered.

## 2013-08-06 ENCOUNTER — Other Ambulatory Visit: Payer: Self-pay

## 2013-08-06 MED ORDER — TRAMADOL HCL 50 MG PO TABS: 50.0000 mg | ORAL_TABLET | Freq: Four times a day (QID) | ORAL | Status: DC | PRN

## 2013-08-29 ENCOUNTER — Encounter: Payer: Medicare HMO | Attending: Physical Medicine & Rehabilitation | Admitting: Physical Medicine & Rehabilitation

## 2013-08-29 ENCOUNTER — Encounter: Payer: Self-pay | Admitting: Physical Medicine & Rehabilitation

## 2013-08-29 VITALS — BP 143/70 | HR 78 | Resp 14 | Ht 72.0 in | Wt 195.0 lb

## 2013-08-29 DIAGNOSIS — K592 Neurogenic bowel, not elsewhere classified: Secondary | ICD-10-CM | POA: Insufficient documentation

## 2013-08-29 DIAGNOSIS — IMO0002 Reserved for concepts with insufficient information to code with codable children: Secondary | ICD-10-CM

## 2013-08-29 DIAGNOSIS — I10 Essential (primary) hypertension: Secondary | ICD-10-CM | POA: Insufficient documentation

## 2013-08-29 DIAGNOSIS — E785 Hyperlipidemia, unspecified: Secondary | ICD-10-CM | POA: Insufficient documentation

## 2013-08-29 DIAGNOSIS — Z981 Arthrodesis status: Secondary | ICD-10-CM | POA: Insufficient documentation

## 2013-08-29 DIAGNOSIS — E119 Type 2 diabetes mellitus without complications: Secondary | ICD-10-CM | POA: Insufficient documentation

## 2013-08-29 DIAGNOSIS — M4712 Other spondylosis with myelopathy, cervical region: Secondary | ICD-10-CM | POA: Insufficient documentation

## 2013-08-29 DIAGNOSIS — M1711 Unilateral primary osteoarthritis, right knee: Secondary | ICD-10-CM

## 2013-08-29 DIAGNOSIS — M1710 Unilateral primary osteoarthritis, unspecified knee: Secondary | ICD-10-CM | POA: Insufficient documentation

## 2013-08-29 DIAGNOSIS — Z79899 Other long term (current) drug therapy: Secondary | ICD-10-CM | POA: Insufficient documentation

## 2013-08-29 NOTE — Progress Notes (Signed)
Subjective:    Patient ID: [name], male    DOB: 1946/07/14, 68 y.o.   MRN: [id number]  HPI  Mr. Flannery is back regarding his chronic right knee pain and cervical myelopathy.  He had good results for about a month with the steroid injection but it has since worn off. He received his knee brace which he still uses with some benefit.  He is interested in the synvisc injection we discussed before.  He uses tramadol in the morning and again sometimes in the afternoon. Generally he takes two per day.   He hasn't noticed a lot of improvement in his left hand. He wears his splint during the day.   Pain Inventory Average Pain 8 Pain Right Now 6 My pain is aching  In the last 24 hours, has pain interfered with the following? General activity 8 Relation with others 5 Enjoyment of life 5 What TIME of day is your pain at its worst? night Sleep (in general) Fair  Pain is worse with: walking, standing and some activites Pain improves with: medication Relief from Meds: 8  Mobility use a cane use a walker ability to climb steps?  yes do you drive?  yes  Function retired I need assistance with the following:  household duties  Neuro/Psych trouble walking  Prior Studies Any changes since last visit?  no  Physicians involved in your care Any changes since last visit?  no   Family History  Problem Relation Age of Onset  . Anesthesia problems Neg Hx    History   Social History  . Marital Status: Legally Separated    Spouse Name: N/A    Number of Children: N/A  . Years of Education: N/A   Social History Main Topics  . Smoking status: Former Smoker -- 1.00 packs/day for 35 years    Types: Cigarettes    Quit date: 07/19/1988  . Smokeless tobacco: Former Neurosurgeon  . Alcohol Use: Yes     Comment: None since January 21 1979  . Drug Use: No  . Sexual Activity: Not Currently   Other Topics Concern  . None   Social History Narrative  . None   Past Surgical History    Procedure Laterality Date  . Anterior cervical decomp/discectomy fusion  2008  . Posterior fusion cervical spine  01/04/12  . Joint replacement      Left Hip Replacement  . Total hip arthroplasty  1999    left  . Excisional hemorrhoidectomy  1970's  . Inguinal hernia repair  ~ 1975    left  . Posterior cervical laminectomy  01/04/2012    Procedure: POSTERIOR CERVICAL LAMINECTOMY;  Surgeon: Clydene Fake, MD;  Location: [facility];  Service: Neurosurgery;  Laterality: N/A;  Cervical Two to Thoracic One2  Decompressive Laminectomy, Fusion Cervical Two to Thoracic One with Instrumentation  . Posterior cervical laminectomy  01/09/2012    Procedure: POSTERIOR CERVICAL LAMINECTOMY;  Surgeon: Hewitt Shorts, MD;  Location: [facility];  Service: Neurosurgery;  Laterality: N/A;  Evacuation of Epidural Hematoma   Past Medical History  Diagnosis Date  . Neuromuscular disorder   . Hypertension   . High cholesterol   . Type II diabetes mellitus   . Arthritis     "right knee; left hip"   BP 143/70  Pulse 78  Resp 14  Ht 6' (1.829 m)  Wt 195 lb (88.451 kg)  BMI 26.44 kg/m2  SpO2 98%  Opioid Risk Score:  Fall Risk Score: Moderate Fall Risk (6-13 points) (patient educated handout declined)   Review of Systems  Gastrointestinal: Positive for constipation.  Musculoskeletal: Positive for gait problem.  All other systems reviewed and are negative.       Objective:   Physical Exam  General: Alert and oriented x 3, No apparent distress  HEENT: Head is normocephalic, atraumatic, PERRLA, EOMI, sclera anicteric, oral mucosa pink and moist, dentition intact, ext ear canals clear,  Neck: Supple without JVD or lymphadenopathy  Heart: Reg rate and rhythm. No murmurs rubs or gallops  Chest: CTA bilaterally without wheezes, rales, or rhonchi; no distress  Abdomen: Soft, non-tender, non-distended, bowel sounds positive.  Extremities: No clubbing, cyanosis, or edema. Pulses are 2+  Skin:  Clean and intact without signs of breakdown  Neuro: Pt is cognitively appropriate with normal insight, memory, and awareness. Cranial nerves 2-12 are intact. Sensory exam is 1/2. Reflexes are 2++ on the left. LLE is grossly 4/5. LUE is 4 prox at deltoid, biceps, 3 to 3+triceps, trace 1+ with FF, 1 /5 with WF,WE. He is developing flexion contractures in the left middle and 5th gingers.  Musculoskeletal:   severe valgus deformity of the right knee especially when walking. Left knee goes into recurvatum sligthtly with standing. Right knee displays general laxity with PROM. He doesn't appear to be at risk of falling. He has contractures of the MCP's and PIP's of the left hand.  Psych: Pt's affect is appropriate. Pt is cooperative     1. Cervical myelopathy. Status post C2-C7 laminectomy and fusion 01/04/2012 with postoperative epidural hematoma evacuation 01/09/2012  -tramadol for breakthrough pain---consider hydrocodone if needed at same schedule. He may call me to start this if the injection doesn't help today.  -continue with left spica wrist splint to assist with wrist control and finger use/flexion -use resting WHO at night. 2. OA of right knee-Synvisc injection at next visit pending approval.     5. Hypertension. Good control  6. Diabetes mellitus. Continue home control  7. Hyperlipidemia. Zocor  8. Neurogenic bowel: bowel regimen remains appropriate  9. I'lll see him back in about 3 weeks. 15 minutes of face to face patient care time were spent during this visit. All questions were encouraged and answered.

## 2013-08-29 NOTE — Patient Instructions (Signed)
IF YOU DECIDE YOU WANT TO MOVE UP TO THE HYDROCODONE LET ME KNOW.

## 2013-10-26 ENCOUNTER — Encounter: Payer: Medicare HMO | Admitting: Physical Medicine & Rehabilitation

## 2013-12-26 ENCOUNTER — Encounter: Payer: Self-pay | Admitting: Physical Medicine & Rehabilitation

## 2013-12-26 ENCOUNTER — Encounter: Payer: Medicare HMO | Attending: Physical Medicine & Rehabilitation | Admitting: Physical Medicine & Rehabilitation

## 2013-12-26 VITALS — BP 146/73 | HR 84 | Resp 14 | Ht 72.0 in | Wt 198.0 lb

## 2013-12-26 DIAGNOSIS — K592 Neurogenic bowel, not elsewhere classified: Secondary | ICD-10-CM | POA: Insufficient documentation

## 2013-12-26 DIAGNOSIS — Z87891 Personal history of nicotine dependence: Secondary | ICD-10-CM | POA: Insufficient documentation

## 2013-12-26 DIAGNOSIS — M1710 Unilateral primary osteoarthritis, unspecified knee: Secondary | ICD-10-CM | POA: Insufficient documentation

## 2013-12-26 DIAGNOSIS — M4712 Other spondylosis with myelopathy, cervical region: Secondary | ICD-10-CM | POA: Insufficient documentation

## 2013-12-26 DIAGNOSIS — IMO0002 Reserved for concepts with insufficient information to code with codable children: Secondary | ICD-10-CM

## 2013-12-26 DIAGNOSIS — M1711 Unilateral primary osteoarthritis, right knee: Secondary | ICD-10-CM

## 2013-12-26 DIAGNOSIS — E119 Type 2 diabetes mellitus without complications: Secondary | ICD-10-CM | POA: Insufficient documentation

## 2013-12-26 DIAGNOSIS — I10 Essential (primary) hypertension: Secondary | ICD-10-CM | POA: Insufficient documentation

## 2013-12-26 DIAGNOSIS — Z981 Arthrodesis status: Secondary | ICD-10-CM | POA: Insufficient documentation

## 2013-12-26 DIAGNOSIS — Z96649 Presence of unspecified artificial hip joint: Secondary | ICD-10-CM | POA: Insufficient documentation

## 2013-12-26 DIAGNOSIS — E785 Hyperlipidemia, unspecified: Secondary | ICD-10-CM | POA: Insufficient documentation

## 2013-12-26 MED ORDER — TRAMADOL HCL 50 MG PO TABS: 50.0000 mg | ORAL_TABLET | Freq: Four times a day (QID) | ORAL | Status: DC | PRN

## 2013-12-26 NOTE — Patient Instructions (Signed)
PLEASE CALL ME WITH ANY PROBLEMS OR QUESTIONS (#[phone]).      

## 2013-12-26 NOTE — Progress Notes (Signed)
Subjective:    Patient ID: [name], male    DOB: 04/01/1946, 68 y.o.   MRN: [id number]  HPI  [name] is here for a synvisc injection. Today it some time for approval and to get him an appt.   Pain Inventory Average Pain 7 Pain Right Now 5 My pain is constant and aching  In the last 24 hours, has pain interfered with the following? General activity 7 Relation with others 6 Enjoyment of life 9 What TIME of day is your pain at its worst? evening Sleep (in general) Fair  Pain is worse with: standing and some activites Pain improves with: rest and medication Relief from Meds: na  Mobility walk with assistance use a cane use a walker ability to climb steps?  yes do you drive?  yes  Function retired I need assistance with the following:  household duties  Neuro/Psych numbness trouble walking  Prior Studies Any changes since last visit?  no  Physicians involved in your care Any changes since last visit?  no   Family History  Problem Relation Age of Onset  . Anesthesia problems Neg Hx    History   Social History  . Marital Status: Legally Separated    Spouse Name: N/A    Number of Children: N/A  . Years of Education: N/A   Social History Main Topics  . Smoking status: Former Smoker -- 1.00 packs/day for 35 years    Types: Cigarettes    Quit date: 07/19/1988  . Smokeless tobacco: Former NeurosurgeonUser  . Alcohol Use: Yes     Comment: None since January 21 1979  . Drug Use: No  . Sexual Activity: Not Currently   Other Topics Concern  . None   Social History Narrative  . None   Past Surgical History  Procedure Laterality Date  . Anterior cervical decomp/discectomy fusion  2008  . Posterior fusion cervical spine  01/04/12  . Joint replacement      Left Hip Replacement  . Total hip arthroplasty  1999    left  . Excisional hemorrhoidectomy  1970's  . Inguinal hernia repair  ~ 1975    left  . Posterior cervical laminectomy  01/04/2012    Procedure: POSTERIOR  CERVICAL LAMINECTOMY;  Surgeon: Clydene FakeJames R Hirsch, MD;  Location: [facility];  Service: Neurosurgery;  Laterality: N/A;  Cervical Two to Thoracic One2  Decompressive Laminectomy, Fusion Cervical Two to Thoracic One with Instrumentation  . Posterior cervical laminectomy  01/09/2012    Procedure: POSTERIOR CERVICAL LAMINECTOMY;  Surgeon: Hewitt Shortsobert W Nudelman, MD;  Location: [facility];  Service: Neurosurgery;  Laterality: N/A;  Evacuation of Epidural Hematoma   Past Medical History  Diagnosis Date  . Neuromuscular disorder   . Hypertension   . High cholesterol   . Type II diabetes mellitus   . Arthritis     "right knee; left hip"   BP 146/73  Pulse 84  Resp 14  Ht 6' (1.829 m)  Wt 198 lb (89.812 kg)  BMI 26.85 kg/m2  SpO2 96%  Opioid Risk Score:   Fall Risk Score: Moderate Fall Risk (6-13 points) (pt educated on fall risk, brochure given to pt previously)   Review of Systems  Gastrointestinal: Positive for constipation.  Musculoskeletal: Positive for gait problem.  Neurological: Positive for numbness.  All other systems reviewed and are negative.      Objective:   Physical Exam  General: Alert and oriented x 3, No apparent distress  HEENT:  Head is normocephalic, atraumatic, PERRLA, EOMI, sclera anicteric, oral mucosa pink and moist, dentition intact, ext ear canals clear,  Neck: Supple without JVD or lymphadenopathy  Heart: Reg rate and rhythm. No murmurs rubs or gallops  Chest: CTA bilaterally without wheezes, rales, or rhonchi; no distress  Abdomen: Soft, non-tender, non-distended, bowel sounds positive.  Extremities: No clubbing, cyanosis, or edema. Pulses are 2+  Skin: Clean and intact without signs of breakdown  Neuro: Pt is cognitively appropriate with normal insight, memory, and awareness. Cranial nerves 2-12 are intact. Sensory exam is 1/2. Reflexes are 2++ on the left. LLE is grossly 4/5. LUE is 4 prox at deltoid, biceps, 3 to 3+triceps, trace 1+ with FF, 1 /5 with  WF,WE. He is developing flexion contractures in the left middle and 5th gingers.  Musculoskeletal: severe valgus deformity of the right knee especially when walking. Left knee goes into recurvatum sligthtly with standing. Right knee displays general laxity with PROM. He doesn't appear to be at risk of falling. He has contractures of the MCP's and PIP's of the left hand.  Psych: Pt's affect is appropriate. Pt is cooperative      1. Cervical myelopathy. Status post C2-C7 laminectomy and fusion 01/04/2012 with postoperative epidural hematoma evacuation 01/09/2012  -tramadol for breakthrough pain---consider hydrocodone if needed at same schedule. He may call me to start this if the injection doesn't help today.  -continue with left spica wrist splint to assist with wrist control and finger use/flexion -use resting WHO at night.  2. OA of right knee-Synvisc injection After informed consent and preparation of the skin with betadine and isopropyl alcohol, I injected 60 mg synvisc into the right knee via lateral approach. Additionally, aspiration was performed prior to injection. The patient tolerated well, and no complications were encountered. Afterward the area was cleaned and dressed. Post- injection instructions were provided.    5. Hypertension. Good control  6. Diabetes mellitus. Continue home control  7. Hyperlipidemia. Zocor  8. Neurogenic bowel: bowel regimen remains appropriate  9. I'lll see him back in about 3 weeks. 15 minutes of face to face patient care time were spent during this visit. All questions were encouraged and answered.

## 2014-03-26 ENCOUNTER — Encounter: Payer: Medicare HMO | Admitting: Physical Medicine & Rehabilitation

## 2014-04-29 ENCOUNTER — Encounter
Payer: Commercial Managed Care - HMO | Attending: Physical Medicine & Rehabilitation | Admitting: Physical Medicine & Rehabilitation

## 2014-04-29 ENCOUNTER — Encounter: Payer: Self-pay | Admitting: Physical Medicine & Rehabilitation

## 2014-04-29 VITALS — BP 154/80 | HR 68 | Resp 14 | Ht 72.0 in | Wt 195.0 lb

## 2014-04-29 DIAGNOSIS — E119 Type 2 diabetes mellitus without complications: Secondary | ICD-10-CM | POA: Diagnosis not present

## 2014-04-29 DIAGNOSIS — M4712 Other spondylosis with myelopathy, cervical region: Secondary | ICD-10-CM

## 2014-04-29 DIAGNOSIS — K592 Neurogenic bowel, not elsewhere classified: Secondary | ICD-10-CM | POA: Insufficient documentation

## 2014-04-29 DIAGNOSIS — G9589 Other specified diseases of spinal cord: Secondary | ICD-10-CM | POA: Diagnosis present

## 2014-04-29 DIAGNOSIS — I10 Essential (primary) hypertension: Secondary | ICD-10-CM | POA: Diagnosis not present

## 2014-04-29 DIAGNOSIS — M179 Osteoarthritis of knee, unspecified: Secondary | ICD-10-CM | POA: Diagnosis not present

## 2014-04-29 DIAGNOSIS — E785 Hyperlipidemia, unspecified: Secondary | ICD-10-CM | POA: Diagnosis not present

## 2014-04-29 DIAGNOSIS — M1711 Unilateral primary osteoarthritis, right knee: Secondary | ICD-10-CM

## 2014-04-29 MED ORDER — TRAMADOL HCL 50 MG PO TABS: 50.0000 mg | ORAL_TABLET | Freq: Four times a day (QID) | ORAL | Status: DC | PRN

## 2014-04-29 NOTE — Patient Instructions (Addendum)
PLEASE CALL ME WITH ANY PROBLEMS OR QUESTIONS [phone]).     Glucosamine with chondroitin, ginger, omega 3 fatty acids.

## 2014-04-29 NOTE — Progress Notes (Signed)
Subjective:    Patient ID: [name], male    DOB: 01/30/1946, 68 y.o.   MRN: [id number]  HPI  [name] is back regarding his multiple pain complaints. He had about 2 months of good pain control with the synvisc injection in June. He is getting back close to baseline. He finds capsaicin cream helps. He also uses tramadol.   Pain Inventory Average Pain 5 Pain Right Now 5 My pain is constant and aching  In the last 24 hours, has pain interfered with the following? General activity 8 Relation with others 6 Enjoyment of life 6 What TIME of day is your pain at its worst? morning Sleep (in general) Fair  Pain is worse with: walking and some activites Pain improves with: medication and injections Relief from Meds: 7  Mobility use a cane ability to climb steps?  yes do you drive?  yes  Function retired  Neuro/Psych trouble walking spasms  Prior Studies Any changes since last visit?  no  Physicians involved in your care Any changes since last visit?  no   Family History  Problem Relation Age of Onset  . Anesthesia problems Neg Hx    History   Social History  . Marital Status: Legally Separated    Spouse Name: N/A    Number of Children: N/A  . Years of Education: N/A   Social History Main Topics  . Smoking status: Former Smoker -- 1.00 packs/day for 35 years    Types: Cigarettes    Quit date: 07/19/1988  . Smokeless tobacco: Former NeurosurgeonUser  . Alcohol Use: Yes     Comment: None since January 21 1979  . Drug Use: No  . Sexual Activity: Not Currently   Other Topics Concern  . None   Social History Narrative  . None   Past Surgical History  Procedure Laterality Date  . Anterior cervical decomp/discectomy fusion  2008  . Posterior fusion cervical spine  01/04/12  . Joint replacement      Left Hip Replacement  . Total hip arthroplasty  1999    left  . Excisional hemorrhoidectomy  1970's  . Inguinal hernia repair  ~ 1975    left  . Posterior cervical  laminectomy  01/04/2012    Procedure: POSTERIOR CERVICAL LAMINECTOMY;  Surgeon: Clydene FakeJames R Hirsch, MD;  Location: [facility];  Service: Neurosurgery;  Laterality: N/A;  Cervical Two to Thoracic One2  Decompressive Laminectomy, Fusion Cervical Two to Thoracic One with Instrumentation  . Posterior cervical laminectomy  01/09/2012    Procedure: POSTERIOR CERVICAL LAMINECTOMY;  Surgeon: Hewitt Shortsobert W Nudelman, MD;  Location: [facility];  Service: Neurosurgery;  Laterality: N/A;  Evacuation of Epidural Hematoma   Past Medical History  Diagnosis Date  . Neuromuscular disorder   . Hypertension   . High cholesterol   . Type II diabetes mellitus   . Arthritis     "right knee; left hip"   BP 154/80  Pulse 68  Resp 14  Ht 6' (1.829 m)  Wt 195 lb (88.451 kg)  BMI 26.44 kg/m2  SpO2 98%  Opioid Risk Score:   Fall Risk Score: Low Fall Risk (0-5 points)   Review of Systems     Objective:   Physical Exam  General: Alert and oriented x 3, No apparent distress  HEENT: Head is normocephalic, atraumatic, PERRLA, EOMI, sclera anicteric, oral mucosa pink and moist, dentition intact, ext ear canals clear,  Neck: Supple without JVD or lymphadenopathy  Heart: Reg  rate and rhythm. No murmurs rubs or gallops  Chest: CTA bilaterally without wheezes, rales, or rhonchi; no distress  Abdomen: Soft, non-tender, non-distended, bowel sounds positive.  Extremities: No clubbing, cyanosis, or edema. Pulses are 2+  Skin: Clean and intact without signs of breakdown  Neuro: Pt is cognitively appropriate with normal insight, memory, and awareness. Cranial nerves 2-12 are intact. Sensory exam is 1/2. Reflexes are 2++ on the left. LLE is grossly 4/5. LUE is 4 prox at deltoid, biceps, 3 to 3+triceps, trace 1+ with FF, 1 /5 with WF,WE. He is developing flexion contractures in the left middle and 5th gingers.  Musculoskeletal: severe valgus deformity of the right knee especially when walking. Left knee goes into recurvatum  sligthtly with standing. Right knee displays general laxity with PROM. He doesn't appear to be at risk of falling. He has contractures of the MCP's and PIP's of  the left hand.  Psych: Pt's affect is appropriate. Pt is cooperative    1. Cervical myelopathy. Status post C2-C7 laminectomy and fusion 01/04/2012 with postoperative epidural hematoma evacuation 01/09/2012  -tramadol for breakthrough pain---   2. OA of right knee  -consider repeat knee steroid injection  -probably is at the point where he needs to consider knee replacement 5. Hypertension. Good control  6. Diabetes mellitus. Continue home control  7. Hyperlipidemia. Zocor  8. Neurogenic bowel: bowel regimen remains appropriate  9. I'lll see him back in about 3 months. 15 minutes of face to face patient care time were spent during this visit. All questions were encouraged and answered.

## 2014-05-17 ENCOUNTER — Telehealth: Payer: Self-pay | Admitting: *Deleted

## 2014-05-17 NOTE — Telephone Encounter (Signed)
[facility]

## 2014-07-30 ENCOUNTER — Encounter: Payer: Commercial Managed Care - HMO | Admitting: Physical Medicine & Rehabilitation

## 2014-09-16 ENCOUNTER — Encounter
Payer: Commercial Managed Care - HMO | Attending: Physical Medicine & Rehabilitation | Admitting: Physical Medicine & Rehabilitation

## 2014-09-16 ENCOUNTER — Encounter: Payer: Self-pay | Admitting: Physical Medicine & Rehabilitation

## 2014-09-16 VITALS — BP 127/74 | HR 82 | Resp 14

## 2014-09-16 DIAGNOSIS — M179 Osteoarthritis of knee, unspecified: Secondary | ICD-10-CM | POA: Diagnosis not present

## 2014-09-16 DIAGNOSIS — M1711 Unilateral primary osteoarthritis, right knee: Secondary | ICD-10-CM

## 2014-09-16 DIAGNOSIS — E119 Type 2 diabetes mellitus without complications: Secondary | ICD-10-CM | POA: Insufficient documentation

## 2014-09-16 DIAGNOSIS — G9589 Other specified diseases of spinal cord: Secondary | ICD-10-CM | POA: Insufficient documentation

## 2014-09-16 DIAGNOSIS — K592 Neurogenic bowel, not elsewhere classified: Secondary | ICD-10-CM | POA: Diagnosis not present

## 2014-09-16 DIAGNOSIS — E785 Hyperlipidemia, unspecified: Secondary | ICD-10-CM | POA: Diagnosis not present

## 2014-09-16 DIAGNOSIS — I10 Essential (primary) hypertension: Secondary | ICD-10-CM | POA: Insufficient documentation

## 2014-09-16 NOTE — Progress Notes (Signed)
Right knee injection Indication: Endstage OA right knee, severe valgus deformity  After informed consent and preparation of the skin with betadine and isopropyl alcohol, I injected 6mg  (1cc) of celestone and 4cc of 1% lidocaine into the right knee via anterolateral approach. Additionally, aspiration was performed prior to injection. The patient tolerated well, and no complications were encountered. Afterward the area was cleaned and dressed. Post- injection instructions were provided.   Follow up in 3 months.

## 2014-09-16 NOTE — Patient Instructions (Signed)
PLEASE CALL ME WITH ANY PROBLEMS OR QUESTIONS (#[phone]).      

## 2014-12-17 ENCOUNTER — Ambulatory Visit: Payer: Medicare HMO | Admitting: Physical Medicine & Rehabilitation

## 2015-01-08 ENCOUNTER — Encounter: Payer: Medicare HMO | Admitting: Physical Medicine & Rehabilitation

## 2015-01-08 ENCOUNTER — Telehealth: Payer: Self-pay | Admitting: *Deleted

## 2015-01-08 NOTE — Telephone Encounter (Signed)
Patient called to reschedule his missed appointment - will have to be with Riley Lam - Dr. Riley Kill is booked up until August.  First Available with Appointment is July 22.  I called and left message asking patient to return call to reschedule and also need to know which pharmacy to call in Tramadol

## 2015-01-09 MED ORDER — TRAMADOL HCL 50 MG PO TABS: 50.0000 mg | ORAL_TABLET | Freq: Four times a day (QID) | ORAL | Status: DC | PRN

## 2015-01-09 NOTE — Telephone Encounter (Signed)
Terelle called to reschedule his appointment and report where he wants his tramadol called to --[facility].  Called to their pharmacy.

## 2015-01-27 ENCOUNTER — Ambulatory Visit: Payer: Medicare HMO | Admitting: Physical Medicine & Rehabilitation

## 2015-03-21 ENCOUNTER — Encounter: Payer: Medicare HMO | Admitting: Physical Medicine & Rehabilitation

## 2015-04-30 ENCOUNTER — Encounter: Payer: Medicare HMO | Attending: Physical Medicine & Rehabilitation | Admitting: Physical Medicine & Rehabilitation

## 2015-04-30 ENCOUNTER — Encounter: Payer: Self-pay | Admitting: Physical Medicine & Rehabilitation

## 2015-04-30 VITALS — BP 152/77 | HR 82 | Resp 14

## 2015-04-30 DIAGNOSIS — M1711 Unilateral primary osteoarthritis, right knee: Secondary | ICD-10-CM | POA: Insufficient documentation

## 2015-04-30 NOTE — Patient Instructions (Signed)
PLEASE CALL ME WITH ANY PROBLEMS OR QUESTIONS (#[phone]).  HAVE A GOOD DAY!    

## 2015-04-30 NOTE — Progress Notes (Signed)
Right knee injection Indication: Endstage OA right knee, severe, persistent valgus deformity  After informed consent and preparation of the skin with betadine and isopropyl alcohol, I injected 6mg  (1cc) of celestone and 4cc of 1% lidocaine into the right knee via anterolateral approach. Additionally, aspiration was performed prior to injection. The patient tolerated well, and no complications were encountered. Afterward the area was cleaned and dressed. Post- injection instructions were provided.   Follow up in 3 months. consider synvisc at that time

## 2015-05-05 ENCOUNTER — Telehealth: Payer: Self-pay | Admitting: Physical Medicine & Rehabilitation

## 2015-05-05 MED ORDER — TRAMADOL HCL 50 MG PO TABS: 50.0000 mg | ORAL_TABLET | Freq: Four times a day (QID) | ORAL | Status: DC | PRN

## 2015-05-05 NOTE — Telephone Encounter (Signed)
Patient needs a refill on Tramadol sent to his online pharmacy.  Please call patient with any questions.

## 2015-05-06 NOTE — Telephone Encounter (Signed)
rx sent, pt called, message left

## 2015-07-30 ENCOUNTER — Encounter: Payer: Medicare HMO | Admitting: Physical Medicine & Rehabilitation

## 2015-08-19 ENCOUNTER — Other Ambulatory Visit: Payer: Self-pay

## 2015-08-19 NOTE — Telephone Encounter (Signed)
Pt needs a refill on Tramadol. He uses [facility].

## 2015-08-21 MED ORDER — TRAMADOL HCL 50 MG PO TABS: 50.0000 mg | ORAL_TABLET | Freq: Four times a day (QID) | ORAL | Status: DC | PRN

## 2015-08-21 NOTE — Addendum Note (Signed)
Addended by: Kathrine Haddock L on: 08/21/2015 08:38 AM   Modules accepted: Orders

## 2015-08-21 NOTE — Addendum Note (Signed)
Addended by: Kathrine Haddock L on: 08/21/2015 12:04 PM   Modules accepted: Orders

## 2015-08-21 NOTE — Telephone Encounter (Signed)
We may refill. thx

## 2015-08-21 NOTE — Telephone Encounter (Signed)
Please advise on Tramadol refill. 

## 2015-08-26 ENCOUNTER — Telehealth: Payer: Self-pay | Admitting: *Deleted

## 2015-08-26 NOTE — Telephone Encounter (Signed)
[name] calling about his tramadol refill.  He says he called last Monday and has heard nothing.  I let him know that according to my records it was called to his mail order pharmacy on 08/21/15.  I asked him to verify that they have th order and if not have them send a request.

## 2015-09-10 ENCOUNTER — Encounter: Payer: Self-pay | Admitting: Physical Medicine & Rehabilitation

## 2015-09-10 ENCOUNTER — Encounter: Payer: Medicare HMO | Attending: Physical Medicine & Rehabilitation | Admitting: Physical Medicine & Rehabilitation

## 2015-09-10 VITALS — BP 126/63 | HR 64 | Resp 14

## 2015-09-10 DIAGNOSIS — G8929 Other chronic pain: Secondary | ICD-10-CM | POA: Diagnosis present

## 2015-09-10 DIAGNOSIS — Z87891 Personal history of nicotine dependence: Secondary | ICD-10-CM | POA: Diagnosis not present

## 2015-09-10 DIAGNOSIS — M4712 Other spondylosis with myelopathy, cervical region: Secondary | ICD-10-CM

## 2015-09-10 DIAGNOSIS — M25561 Pain in right knee: Secondary | ICD-10-CM | POA: Insufficient documentation

## 2015-09-10 DIAGNOSIS — G959 Disease of spinal cord, unspecified: Secondary | ICD-10-CM | POA: Diagnosis present

## 2015-09-10 DIAGNOSIS — I10 Essential (primary) hypertension: Secondary | ICD-10-CM | POA: Insufficient documentation

## 2015-09-10 DIAGNOSIS — M1711 Unilateral primary osteoarthritis, right knee: Secondary | ICD-10-CM | POA: Diagnosis not present

## 2015-09-10 DIAGNOSIS — E7800 Pure hypercholesterolemia, unspecified: Secondary | ICD-10-CM | POA: Insufficient documentation

## 2015-09-10 DIAGNOSIS — G709 Myoneural disorder, unspecified: Secondary | ICD-10-CM | POA: Diagnosis not present

## 2015-09-10 DIAGNOSIS — E119 Type 2 diabetes mellitus without complications: Secondary | ICD-10-CM | POA: Diagnosis not present

## 2015-09-10 NOTE — Progress Notes (Signed)
Subjective:    Patient ID: [name], male    DOB: September 13, 1945, 70 y.o.   MRN: [id number]  HPI  [name] is back regarding his chronic right knee pain and cervical myelopathy. His knee pain has gradually increased since his las injection in October. We haven't performed a synvisc injection since June 2015.   He wears his splint on the LUE. He stretches daily and uses the hand for what he can, which is typically just gross movements. He struggles to grip anything with the hand.   He continues to use a cane for ambulation.   Pain Inventory Average Pain 7 Pain Right Now 4 My pain is constant  In the last 24 hours, has pain interfered with the following? General activity 6 Relation with others 4 Enjoyment of life 7 What TIME of day is your pain at its worst? daytime Sleep (in general) Fair  Pain is worse with: walking and bending Pain improves with: rest, heat/ice and medication Relief from Meds: no selection  Mobility walk with assistance use a cane use a walker how many minutes can you walk? 20-30 ability to climb steps?  yes do you drive?  yes Do you have any goals in this area?  no  Function retired  Neuro/Psych trouble walking  Prior Studies Any changes since last visit?  no  Physicians involved in your care Any changes since last visit?  no   Family History  Problem Relation Age of Onset  . Anesthesia problems Neg Hx    Social History   Social History  . Marital Status: Legally Separated    Spouse Name: N/A  . Number of Children: N/A  . Years of Education: N/A   Social History Main Topics  . Smoking status: Former Smoker -- 1.00 packs/day for 35 years    Types: Cigarettes    Quit date: 07/19/1988  . Smokeless tobacco: Former Neurosurgeon  . Alcohol Use: Yes     Comment: None since January 21 1979  . Drug Use: No  . Sexual Activity: Not Currently   Other Topics Concern  . None   Social History Narrative   Past Surgical History  Procedure  Laterality Date  . Anterior cervical decomp/discectomy fusion  2008  . Posterior fusion cervical spine  01/04/12  . Joint replacement      Left Hip Replacement  . Total hip arthroplasty  1999    left  . Excisional hemorrhoidectomy  1970's  . Inguinal hernia repair  ~ 1975    left  . Posterior cervical laminectomy  01/04/2012    Procedure: POSTERIOR CERVICAL LAMINECTOMY;  Surgeon: Clydene Fake, MD;  Location: [facility];  Service: Neurosurgery;  Laterality: N/A;  Cervical Two to Thoracic One2  Decompressive Laminectomy, Fusion Cervical Two to Thoracic One with Instrumentation  . Posterior cervical laminectomy  01/09/2012    Procedure: POSTERIOR CERVICAL LAMINECTOMY;  Surgeon: Hewitt Shorts, MD;  Location: [facility];  Service: Neurosurgery;  Laterality: N/A;  Evacuation of Epidural Hematoma   Past Medical History  Diagnosis Date  . Neuromuscular disorder (HCC)   . Hypertension   . High cholesterol   . Type II diabetes mellitus (HCC)   . Arthritis     "right knee; left hip"   BP 126/63 mmHg  Pulse 64  Resp 14  SpO2 97%  Opioid Risk Score:   Fall Risk Score:  `1  Depression screen PHQ 2/9  No flowsheet data found.   Review of Systems  All other systems reviewed and are negative.      Objective:   Physical Exam   General: Alert and oriented x 3, No apparent distress  HEENT: Head is normocephalic, atraumatic, PERRLA, EOMI, sclera anicteric, oral mucosa pink and moist, dentition intact, ext ear canals clear,  Neck: Supple without JVD or lymphadenopathy  Heart: Reg rate and rhythm. No murmurs rubs or gallops  Chest: CTA bilaterally without wheezes, rales, or rhonchi; no distress  Abdomen: Soft, non-tender, non-distended, bowel sounds positive.  Extremities: No clubbing, cyanosis, or edema. Pulses are 2+  Skin: Clean and intact without signs of breakdown  Neuro: Pt is cognitively appropriate with normal insight, memory, and awareness. Cranial nerves 2-12  are intact. Sensory exam is 1/2. Reflexes are 2++ on the left. LLE is grossly 4/5. LUE is 4 prox at deltoid, biceps, 3 to 3+triceps, trace 1+ with FF, 1 /5 with WF,WE. He is developing flexion contractures in the left middle and 5th gingers.  Musculoskeletal: severe valgus deformity of the right knee especially when walking. Left knee goes into recurvatum sligthtly with standing. Right knee displays ongoing laxity with PROM. He doesn't appear to be at risk of falling. He has tightness still in the MCP's and PIP's of the left hand.  Psych: Pt's affect is appropriate. Pt is cooperative    1. Cervical myelopathy. Status post C2-C7 laminectomy and fusion 01/04/2012 with postoperative epidural hematoma evacuation 01/09/2012  -tramadol for breakthrough pain---   2. OA of right knee  -After informed consent and preparation of the skin with betadine and isopropyl alcohol, I injected 6cc of synvisc solution into the right knee via antero-lateral approach. Additionally, aspiration was performed prior to injection. The patient tolerated well, and no complications were encountered. Afterward the area was cleaned and dressed. Post- injection instructions were provided.   - at the point where he needs to consider knee replacement 5. Hypertension. Good control  6. Diabetes mellitus. Continue home control  7. Hyperlipidemia. Zocor  8. Neurogenic bowel: bowel regimen remains appropriate  9. I'lll see him back in about 6 months. 15 minutes of face to face patient care time were spent during this visit. All questions were encouraged and answered.

## 2015-09-10 NOTE — Patient Instructions (Signed)
  PLEASE CALL ME WITH ANY PROBLEMS OR QUESTIONS (#[phone]).      

## 2015-10-30 ENCOUNTER — Other Ambulatory Visit: Payer: Self-pay | Admitting: *Deleted

## 2015-10-30 MED ORDER — TRAMADOL HCL 50 MG PO TABS: 50.0000 mg | ORAL_TABLET | Freq: Four times a day (QID) | ORAL | Status: DC | PRN

## 2016-01-16 ENCOUNTER — Telehealth: Payer: Self-pay

## 2016-01-16 NOTE — Telephone Encounter (Signed)
Pt needs a refill on Tramadol. Last visit: 09/10/15 Next visit: 03/09/16 Please advise.

## 2016-01-19 MED ORDER — TRAMADOL HCL 50 MG PO TABS: 50.0000 mg | ORAL_TABLET | Freq: Four times a day (QID) | ORAL | Status: DC | PRN

## 2016-01-19 NOTE — Telephone Encounter (Signed)
Tramadol ordered. Pt aware.

## 2016-01-19 NOTE — Telephone Encounter (Signed)
Can refill. i have ordered. Please phone in. thanks

## 2016-03-09 ENCOUNTER — Encounter: Payer: Self-pay | Admitting: Physical Medicine & Rehabilitation

## 2016-03-09 ENCOUNTER — Encounter: Payer: Medicare HMO | Attending: Physical Medicine & Rehabilitation | Admitting: Physical Medicine & Rehabilitation

## 2016-03-09 VITALS — BP 151/81 | HR 60 | Resp 16

## 2016-03-09 DIAGNOSIS — M25561 Pain in right knee: Secondary | ICD-10-CM | POA: Diagnosis present

## 2016-03-09 DIAGNOSIS — I10 Essential (primary) hypertension: Secondary | ICD-10-CM | POA: Insufficient documentation

## 2016-03-09 DIAGNOSIS — E119 Type 2 diabetes mellitus without complications: Secondary | ICD-10-CM | POA: Insufficient documentation

## 2016-03-09 DIAGNOSIS — E7800 Pure hypercholesterolemia, unspecified: Secondary | ICD-10-CM | POA: Diagnosis not present

## 2016-03-09 DIAGNOSIS — G959 Disease of spinal cord, unspecified: Secondary | ICD-10-CM | POA: Diagnosis present

## 2016-03-09 DIAGNOSIS — M1711 Unilateral primary osteoarthritis, right knee: Secondary | ICD-10-CM | POA: Insufficient documentation

## 2016-03-09 DIAGNOSIS — G8929 Other chronic pain: Secondary | ICD-10-CM | POA: Insufficient documentation

## 2016-03-09 DIAGNOSIS — M4712 Other spondylosis with myelopathy, cervical region: Secondary | ICD-10-CM

## 2016-03-09 DIAGNOSIS — K592 Neurogenic bowel, not elsewhere classified: Secondary | ICD-10-CM | POA: Insufficient documentation

## 2016-03-09 DIAGNOSIS — G709 Myoneural disorder, unspecified: Secondary | ICD-10-CM | POA: Insufficient documentation

## 2016-03-09 DIAGNOSIS — M7989 Other specified soft tissue disorders: Secondary | ICD-10-CM | POA: Insufficient documentation

## 2016-03-09 MED ORDER — TRAMADOL HCL 50 MG PO TABS: 50.0000 mg | ORAL_TABLET | Freq: Four times a day (QID) | ORAL | 1 refills | Status: DC | PRN

## 2016-03-09 NOTE — Progress Notes (Signed)
Subjective:    Patient ID: [name], male    DOB: 11/11/1945, 70 y.o.   MRN: [id number]  HPI  Mr. Earlene PlaterDavis is here in follow up of his cervical myelopath and chronic right knee pain. His right knee has been slowly worsening the last couple months with increased swelling and pain around the patella. He has been lying low from a standpoint of travel lately. He uses tramadol for breakthrough pain    Pain Inventory Average Pain 6 Pain Right Now 5 My pain is intermittent, sharp and aching  In the last 24 hours, has pain interfered with the following? General activity 6 Relation with others 4 Enjoyment of life 7 What TIME of day is your pain at its worst? daytime Sleep (in general) NA  Pain is worse with: walking, standing and some activites Pain improves with: medication Relief from Meds: not answered  Mobility walk with assistance use a cane use a walker ability to climb steps?  yes do you drive?  yes  Function not employed: date last employed . I need assistance with the following:  household duties  Neuro/Psych weakness numbness trouble walking  Prior Studies Any changes since last visit?  no  Physicians involved in your care Any changes since last visit?  no   Family History  Problem Relation Age of Onset  . Anesthesia problems Neg Hx    Social History   Social History  . Marital status: Legally Separated    Spouse name: N/A  . Number of children: N/A  . Years of education: N/A   Social History Main Topics  . Smoking status: Former Smoker    Packs/day: 1.00    Years: 35.00    Types: Cigarettes    Quit date: 07/19/1988  . Smokeless tobacco: Former NeurosurgeonUser  . Alcohol use Yes     Comment: None since January 21 1979  . Drug use: No  . Sexual activity: Not Currently   Other Topics Concern  . None   Social History Narrative  . None   Past Surgical History:  Procedure Laterality Date  . ANTERIOR CERVICAL DECOMP/DISCECTOMY FUSION  2008  . EXCISIONAL  HEMORRHOIDECTOMY  1970's  . INGUINAL HERNIA REPAIR  ~ 1975   left  . JOINT REPLACEMENT     Left Hip Replacement  . POSTERIOR CERVICAL LAMINECTOMY  01/04/2012   Procedure: POSTERIOR CERVICAL LAMINECTOMY;  Surgeon: Clydene FakeJames R Hirsch, MD;  Location: [facility];  Service: Neurosurgery;  Laterality: N/A;  Cervical Two to Thoracic One2  Decompressive Laminectomy, Fusion Cervical Two to Thoracic One with Instrumentation  . POSTERIOR CERVICAL LAMINECTOMY  01/09/2012   Procedure: POSTERIOR CERVICAL LAMINECTOMY;  Surgeon: Hewitt Shortsobert W Nudelman, MD;  Location: [facility];  Service: Neurosurgery;  Laterality: N/A;  Evacuation of Epidural Hematoma  . POSTERIOR FUSION CERVICAL SPINE  01/04/12  . TOTAL HIP ARTHROPLASTY  1999   left   Past Medical History:  Diagnosis Date  . Arthritis    "right knee; left hip"  . High cholesterol   . Hypertension   . Neuromuscular disorder (HCC)   . Type II diabetes mellitus (HCC)    BP (!) 151/81 (BP Location: Right Arm, Patient Position: Sitting, Cuff Size: Normal)   Pulse 60   Resp 16   SpO2 94%   Opioid Risk Score:   Fall Risk Score:  `1  Depression screen PHQ 2/9  Depression screen PHQ 2/9 03/09/2016  Decreased Interest 0  Down, Depressed, Hopeless 0  PHQ -  2 Score 0   Review of Systems  All other systems reviewed and are negative.      Objective:   Physical Exam  General: Alert and oriented x 3, No apparent distress  HEENT: Head is normocephalic, atraumatic, PERRLA, EOMI, sclera anicteric, oral mucosa pink and moist, dentition intact, ext ear canals clear,  Neck: Supple without JVD or lymphadenopathy  Heart: Reg rate and rhythm. No murmurs rubs or gallops  Chest: CTA bilaterally without wheezes, rales, or rhonchi; no distress  Abdomen: Soft, non-tender, non-distended, bowel sounds positive.  Extremities: No clubbing, cyanosis, or edema. Pulses are 2+  Skin: Clean and intact without signs of breakdown  Neuro: Pt is cognitively appropriate  with normal insight, memory, and awareness. Cranial nerves 2-12 are intact. Sensory exam is 1/2. Reflexes are 2++ on the left. LLE is grossly 4/5. LUE is 4 prox at deltoid, biceps, 3 to 3+triceps, trace 1+ with FF, 1 /5 with WF,WE. He is developing flexion contractures in the left middle and 5th gingers.  Musculoskeletal: severe valgus deformity of the right knee which increases with weight bearing.  Left knee goes into recurvatum sligthtly with standing. Pain with palpation of the patella. Right knee displays ongoing laxity with PROM. He doesn't appear to be at risk of falling. Persistent tightness still in the MCP's and PIP's of the left hand.  Psych: Pt's affect is appropriate. Pt is cooperative    1. Cervical myelopathy. Status post C2-C7 laminectomy and fusion 01/04/2012 with postoperative epidural hematoma evacuation 01/09/2012  -tramadol for breakthrough pain---RF today -resting wrist hand orthosis  2. OA of right knee  -After informed consent and preparation of the skin with betadine and isopropyl alcohol, I injected 6cc of synvisc solution into the right knee via antero-lateral approach. Additionally, aspiration was performed prior to injection. The patient tolerated well, and no complications were encountered. Afterward the area was cleaned and dressed. Post- injection instructions were provided.   - at the point where he needs to consider knee replacement 5. Hypertension. Good control  6. Diabetes mellitus. Continue home control  7. Hyperlipidemia. Zocor  8. Neurogenic bowel: bowel regimen remains appropriate  9. I'lll see him back in about 6 months. 15 minutes of face to face patient care time were spent during this visit. All questions were encouraged and answered.

## 2016-03-09 NOTE — Patient Instructions (Signed)
PLEASE CALL ME WITH ANY PROBLEMS OR QUESTIONS ([phone])  

## 2016-03-10 ENCOUNTER — Telehealth: Payer: Self-pay | Admitting: Physical Medicine & Rehabilitation

## 2016-03-10 NOTE — Telephone Encounter (Signed)
Patient needs his Tramadol prescription sent to [facility].  Patient states the prescription was given to him at his appointment.  Any questions please call patient at [phone].

## 2016-03-12 NOTE — Telephone Encounter (Signed)
Called to [facility] and Mr Earlene PlaterDavis notified.

## 2016-08-10 ENCOUNTER — Telehealth: Payer: Self-pay | Admitting: *Deleted

## 2016-08-10 DIAGNOSIS — M1711 Unilateral primary osteoarthritis, right knee: Secondary | ICD-10-CM

## 2016-08-10 DIAGNOSIS — M4712 Other spondylosis with myelopathy, cervical region: Secondary | ICD-10-CM

## 2016-08-10 MED ORDER — TRAMADOL HCL 50 MG PO TABS: 50.0000 mg | ORAL_TABLET | Freq: Four times a day (QID) | ORAL | 5 refills | Status: DC | PRN

## 2016-08-10 NOTE — Telephone Encounter (Addendum)
Patient left a message asking for refill on tramadol, mail order pharmacy

## 2016-08-10 NOTE — Telephone Encounter (Deleted)
Mail order pharmacy

## 2016-09-08 ENCOUNTER — Encounter: Payer: Medicare HMO | Admitting: Physical Medicine & Rehabilitation

## 2016-09-29 ENCOUNTER — Encounter: Payer: Medicare HMO | Admitting: Physical Medicine & Rehabilitation

## 2017-01-21 ENCOUNTER — Other Ambulatory Visit: Payer: Self-pay | Admitting: *Deleted

## 2017-01-21 DIAGNOSIS — M4712 Other spondylosis with myelopathy, cervical region: Secondary | ICD-10-CM

## 2017-01-21 DIAGNOSIS — M1711 Unilateral primary osteoarthritis, right knee: Secondary | ICD-10-CM

## 2017-01-21 MED ORDER — TRAMADOL HCL 50 MG PO TABS: 50.0000 mg | ORAL_TABLET | Freq: Four times a day (QID) | ORAL | 1 refills | Status: DC | PRN

## 2017-01-21 NOTE — Telephone Encounter (Signed)
Patient called asking for refills on tramadol.  I contacted patient and explained that he is 5 months over due for what was supposed to be a 6 month follow up.  I told him he must make an appointment and keep it or this will be the last script.  He understood

## 2017-02-28 ENCOUNTER — Encounter: Payer: Self-pay | Admitting: Physical Medicine & Rehabilitation

## 2017-02-28 ENCOUNTER — Encounter: Payer: Medicare HMO | Attending: Physical Medicine & Rehabilitation | Admitting: Physical Medicine & Rehabilitation

## 2017-02-28 VITALS — BP 149/73 | HR 57

## 2017-02-28 DIAGNOSIS — I10 Essential (primary) hypertension: Secondary | ICD-10-CM | POA: Insufficient documentation

## 2017-02-28 DIAGNOSIS — E119 Type 2 diabetes mellitus without complications: Secondary | ICD-10-CM | POA: Diagnosis not present

## 2017-02-28 DIAGNOSIS — G8929 Other chronic pain: Secondary | ICD-10-CM | POA: Insufficient documentation

## 2017-02-28 DIAGNOSIS — M1711 Unilateral primary osteoarthritis, right knee: Secondary | ICD-10-CM | POA: Diagnosis not present

## 2017-02-28 DIAGNOSIS — E7800 Pure hypercholesterolemia, unspecified: Secondary | ICD-10-CM | POA: Insufficient documentation

## 2017-02-28 DIAGNOSIS — G8250 Quadriplegia, unspecified: Secondary | ICD-10-CM

## 2017-02-28 DIAGNOSIS — M4712 Other spondylosis with myelopathy, cervical region: Secondary | ICD-10-CM | POA: Diagnosis not present

## 2017-02-28 DIAGNOSIS — Z87891 Personal history of nicotine dependence: Secondary | ICD-10-CM | POA: Diagnosis not present

## 2017-02-28 DIAGNOSIS — M25561 Pain in right knee: Secondary | ICD-10-CM | POA: Diagnosis not present

## 2017-02-28 DIAGNOSIS — G959 Disease of spinal cord, unspecified: Secondary | ICD-10-CM | POA: Insufficient documentation

## 2017-02-28 MED ORDER — TRAMADOL HCL 50 MG PO TABS: 50.0000 mg | ORAL_TABLET | Freq: Four times a day (QID) | ORAL | 2 refills | Status: DC | PRN

## 2017-02-28 NOTE — Patient Instructions (Signed)
PLEASE FEEL FREE TO CALL OUR OFFICE WITH ANY PROBLEMS OR QUESTIONS ([phone])      

## 2017-02-28 NOTE — Progress Notes (Signed)
Subjective:    Patient ID: [name], male    DOB: 09/08/1945, 71 y.o.   MRN: [id number]  HPI   [name] is here in follow up of his chronic pain and myelopathy. He has had a gradual worsening of his right knee pain. We last injected his right knee in August of 2017 with synvisc which helped for quite a while. His current knee brace is falling apart. He also wears a splint on the left wris during the day. He has a resting splint but doesn't want to wear it. He has noticed that his fingers have drawn up some more.   He is using tramadol for his knee pain which remains generally effective. He moderates his activity when possible  Pain Inventory Average Pain 5 Pain Right Now 6 My pain is dull and aching  In the last 24 hours, has pain interfered with the following? General activity . Relation with others . Enjoyment of life . What TIME of day is your pain at its worst? na Sleep (in general) Fair  Pain is worse with: walking, bending and some activites Pain improves with: rest, medication and injections Relief from Meds: .  Mobility use a cane use a walker ability to climb steps?  yes do you drive?  yes  Function retired  Neuro/Psych No problems in this area  Prior Studies Any changes since last visit?  no  Physicians involved in your care Any changes since last visit?  no   Family History  Problem Relation Age of Onset  . Anesthesia problems Neg Hx    Social History   Social History  . Marital status: Legally Separated    Spouse name: N/A  . Number of children: N/A  . Years of education: N/A   Social History Main Topics  . Smoking status: Former Smoker    Packs/day: 1.00    Years: 35.00    Types: Cigarettes    Quit date: 07/19/1988  . Smokeless tobacco: Former NeurosurgeonUser  . Alcohol use Yes     Comment: None since January 21 1979  . Drug use: No  . Sexual activity: Not Currently   Other Topics Concern  . Not on file   Social History Narrative  . No narrative  on file   Past Surgical History:  Procedure Laterality Date  . ANTERIOR CERVICAL DECOMP/DISCECTOMY FUSION  2008  . EXCISIONAL HEMORRHOIDECTOMY  1970's  . INGUINAL HERNIA REPAIR  ~ 1975   left  . JOINT REPLACEMENT     Left Hip Replacement  . POSTERIOR CERVICAL LAMINECTOMY  01/04/2012   Procedure: POSTERIOR CERVICAL LAMINECTOMY;  Surgeon: Clydene FakeJames R Hirsch, MD;  Location: [facility];  Service: Neurosurgery;  Laterality: N/A;  Cervical Two to Thoracic One2  Decompressive Laminectomy, Fusion Cervical Two to Thoracic One with Instrumentation  . POSTERIOR CERVICAL LAMINECTOMY  01/09/2012   Procedure: POSTERIOR CERVICAL LAMINECTOMY;  Surgeon: Hewitt Shortsobert W Nudelman, MD;  Location: [facility];  Service: Neurosurgery;  Laterality: N/A;  Evacuation of Epidural Hematoma  . POSTERIOR FUSION CERVICAL SPINE  01/04/12  . TOTAL HIP ARTHROPLASTY  1999   left   Past Medical History:  Diagnosis Date  . Arthritis    "right knee; left hip"  . High cholesterol   . Hypertension   . Neuromuscular disorder (HCC)   . Type II diabetes mellitus (HCC)    There were no vitals taken for this visit.  Opioid Risk Score:   Fall Risk Score:  `1  Depression  screen PHQ 2/9  Depression screen PHQ 2/9 03/09/2016  Decreased Interest 0  Down, Depressed, Hopeless 0  PHQ - 2 Score 0     Review of Systems  Constitutional: Negative.   HENT: Negative.   Eyes: Negative.   Respiratory: Negative.   Cardiovascular: Negative.   Gastrointestinal: Negative.   Endocrine: Negative.   Genitourinary: Negative.   Musculoskeletal: Negative.   Skin: Negative.   Allergic/Immunologic: Negative.   Neurological: Negative.   Hematological: Negative.   Psychiatric/Behavioral: Negative.   All other systems reviewed and are negative.      Objective:   Physical Exam  General: Alert and oriented x 3, No apparent distress  HEENT:Head is normocephalic, atraumatic, PERRLA, EOMI, sclera anicteric, oral mucosa pink and moist,  dentition intact, ext ear canals clear,  Neck:Supple without JVD or lymphadenopathy  Heart:RRR Chest:CTA   Abdomen:Soft, non-tender, non-distended, bowel sounds positive.  Extremities:No clubbing, cyanosis, or edema. Pulses are 2+  Skin:Clean and intact without signs of breakdown  Neuro:Pt is cognitively appropriate with normal insight, memory, and awareness. Cranial nerves 2-12 are intact. Sensory exam is 1/2. Reflexes are 2++ on the left. LLE is grossly 4/5. LUE is 4 prox at deltoid, biceps,  3+triceps, trace 1+ with FF, 1 /5 with WF,WE. He has mild flexion contractures in the left index, middle and 5th fingers.  Musculoskeletal: continued valgus deformity of the right knee which increases with weight bearing.  Left knee goes into recurvatum sligthtly with standing.  Right knee displays ongoing laxity with PROM. He doesn't appear to be at risk of falling.   Psych:Pt's affect is appropriate. Pt is cooperative     1. Cervical myelopathy. Status post C2-C7 laminectomy and fusion 01/04/2012 with postoperative epidural hematoma evacuation 01/09/2012  -tramadol for breakthrough pain---RF today via mail order -resting wrist hand orthosis.  -provided rx for right [employer] splint -handicapped paperwork was completed today  2. OA of right knee  -After informed consent and preparation of the skin with betadine and isopropyl alcohol, I injected 6mg  (1cc) of celestone and 4cc of 1% lidocaine into the right knee via anterolateral approach. Additionally, aspiration was performed prior to injection. The patient tolerated well, and no complications were encountered. Afterward the area was cleaned and dressed. Post- injection instructions were provided.  - at the point where he needs to consider knee replacement but he's resistant given his past surgical history -can look at repeat synvisc injection at next visit -wrote rx for knee brace today 5. Hypertension. Good control  6. Diabetes  mellitus. Continue home control  7. Hyperlipidemia. Zocor  8. Neurogenic bowel: bowel regimen remains appropriate  9. I'lll see him back in about 4 months. 15 minutes of face to face patient care time were spent during this visit. All questions were encouraged and answered.

## 2017-06-03 ENCOUNTER — Telehealth: Payer: Self-pay | Admitting: *Deleted

## 2017-06-03 NOTE — Telephone Encounter (Signed)
Patient needs refill on tramadol. Will run out before next appt.  Ph[facility] to [facility].  Contacted patient and informed.  Reminded patient keep his appt on  06/29/2017

## 2017-06-29 ENCOUNTER — Encounter: Payer: Medicare HMO | Admitting: Physical Medicine & Rehabilitation

## 2017-07-04 ENCOUNTER — Other Ambulatory Visit: Payer: Self-pay | Admitting: Physical Medicine & Rehabilitation

## 2017-07-04 DIAGNOSIS — M1711 Unilateral primary osteoarthritis, right knee: Secondary | ICD-10-CM

## 2017-07-04 DIAGNOSIS — M4712 Other spondylosis with myelopathy, cervical region: Secondary | ICD-10-CM

## 2017-08-01 ENCOUNTER — Encounter: Payer: Medicare HMO | Admitting: Physical Medicine & Rehabilitation

## 2017-08-03 ENCOUNTER — Encounter: Payer: Medicare HMO | Attending: Physical Medicine & Rehabilitation | Admitting: Physical Medicine & Rehabilitation

## 2017-08-03 ENCOUNTER — Encounter: Payer: Self-pay | Admitting: Physical Medicine & Rehabilitation

## 2017-08-03 ENCOUNTER — Other Ambulatory Visit: Payer: Self-pay

## 2017-08-03 VITALS — BP 134/80 | HR 59

## 2017-08-03 DIAGNOSIS — M1712 Unilateral primary osteoarthritis, left knee: Secondary | ICD-10-CM | POA: Insufficient documentation

## 2017-08-03 DIAGNOSIS — E7800 Pure hypercholesterolemia, unspecified: Secondary | ICD-10-CM | POA: Diagnosis not present

## 2017-08-03 DIAGNOSIS — K592 Neurogenic bowel, not elsewhere classified: Secondary | ICD-10-CM | POA: Insufficient documentation

## 2017-08-03 DIAGNOSIS — Z96642 Presence of left artificial hip joint: Secondary | ICD-10-CM | POA: Insufficient documentation

## 2017-08-03 DIAGNOSIS — Z981 Arthrodesis status: Secondary | ICD-10-CM | POA: Insufficient documentation

## 2017-08-03 DIAGNOSIS — Z9889 Other specified postprocedural states: Secondary | ICD-10-CM | POA: Diagnosis not present

## 2017-08-03 DIAGNOSIS — I10 Essential (primary) hypertension: Secondary | ICD-10-CM | POA: Diagnosis not present

## 2017-08-03 DIAGNOSIS — Z87891 Personal history of nicotine dependence: Secondary | ICD-10-CM | POA: Diagnosis not present

## 2017-08-03 DIAGNOSIS — N529 Male erectile dysfunction, unspecified: Secondary | ICD-10-CM | POA: Diagnosis not present

## 2017-08-03 DIAGNOSIS — E119 Type 2 diabetes mellitus without complications: Secondary | ICD-10-CM | POA: Diagnosis not present

## 2017-08-03 DIAGNOSIS — N5202 Corporo-venous occlusive erectile dysfunction: Secondary | ICD-10-CM | POA: Diagnosis not present

## 2017-08-03 DIAGNOSIS — M1711 Unilateral primary osteoarthritis, right knee: Secondary | ICD-10-CM

## 2017-08-03 DIAGNOSIS — M4712 Other spondylosis with myelopathy, cervical region: Secondary | ICD-10-CM | POA: Diagnosis not present

## 2017-08-03 MED ORDER — SILDENAFIL CITRATE 100 MG PO TABS: 100.0000 mg | ORAL_TABLET | Freq: Every day | ORAL | 0 refills | Status: AC | PRN

## 2017-08-03 MED ORDER — TRAMADOL HCL 50 MG PO TABS: 50.0000 mg | ORAL_TABLET | Freq: Four times a day (QID) | ORAL | 2 refills | Status: DC | PRN

## 2017-08-03 NOTE — Patient Instructions (Signed)
PLEASE FEEL FREE TO CALL OUR OFFICE WITH ANY PROBLEMS OR QUESTIONS ([phone])      

## 2017-08-03 NOTE — Progress Notes (Signed)
Subjective:    Patient ID: [name], male    DOB: 10-12-1945, 72 y.o.   MRN: [id number]  HPI   [name] is here in follow up of his cervical myelopathy and end-stage osteoarthritis of the right knee.  He has ongoing right knee pain.  Injections that we have done on the knee prove helpful for to 3 months on average.  He has not gotten her braces that we wrote prescriptions for because the company apparently went out of business.  He has been in contact with a new company and would like orders written for bracing today.  Pain levels are fairly consistent.  He does what he can from a mobility standpoint but is limited often because of the right knee.  He does use some tramadol for his breakthrough pain.  He also uses diclofenac twice daily.  Pain Inventory Average Pain 8 Pain Right Now 5 My pain is constant and sharp  In the last 24 hours, has pain interfered with the following? General activity 7 Relation with others 4 Enjoyment of life 7 What TIME of day is your pain at its worst? night Sleep (in general) Fair  Pain is worse with: walking and standing Pain improves with: medication and injections Relief from Meds: 0  Mobility use a cane use a walker ability to climb steps?  yes do you drive?  yes transfers alone  Function retired I need assistance with the following:  household duties and shopping Do you have any goals in this area?  no  Neuro/Psych trouble walking  Prior Studies Any changes since last visit?  no  Physicians involved in your care Any changes since last visit?  no   Family History  Problem Relation Age of Onset  . Anesthesia problems Neg Hx    Social History   Socioeconomic History  . Marital status: Legally Separated    Spouse name: None  . Number of children: None  . Years of education: None  . Highest education level: None  Social Needs  . Financial resource strain: None  . Food insecurity - worry: None  . Food insecurity -  inability: None  . Transportation needs - medical: None  . Transportation needs - non-medical: None  Occupational History  . None  Tobacco Use  . Smoking status: Former Smoker    Packs/day: 1.00    Years: 35.00    Pack years: 35.00    Types: Cigarettes    Last attempt to quit: 07/19/1988    Years since quitting: 29.0  . Smokeless tobacco: Former Engineer, water and Sexual Activity  . Alcohol use: Yes    Comment: None since January 21 1979  . Drug use: No  . Sexual activity: Not Currently  Other Topics Concern  . None  Social History Narrative  . None   Past Surgical History:  Procedure Laterality Date  . ANTERIOR CERVICAL DECOMP/DISCECTOMY FUSION  2008  . EXCISIONAL HEMORRHOIDECTOMY  1970's  . INGUINAL HERNIA REPAIR  ~ 1975   left  . JOINT REPLACEMENT     Left Hip Replacement  . POSTERIOR CERVICAL LAMINECTOMY  01/04/2012   Procedure: POSTERIOR CERVICAL LAMINECTOMY;  Surgeon: Clydene Fake, MD;  Location: [facility];  Service: Neurosurgery;  Laterality: N/A;  Cervical Two to Thoracic One2  Decompressive Laminectomy, Fusion Cervical Two to Thoracic One with Instrumentation  . POSTERIOR CERVICAL LAMINECTOMY  01/09/2012   Procedure: POSTERIOR CERVICAL LAMINECTOMY;  Surgeon: Hewitt Shorts, MD;  Location: [facility]  NEURO ORS;  Service: Neurosurgery;  Laterality: N/A;  Evacuation of Epidural Hematoma  . POSTERIOR FUSION CERVICAL SPINE  01/04/12  . TOTAL HIP ARTHROPLASTY  1999   left   Past Medical History:  Diagnosis Date  . Arthritis    "right knee; left hip"  . High cholesterol   . Hypertension   . Neuromuscular disorder (HCC)   . Type II diabetes mellitus (HCC)    There were no vitals taken for this visit.  Opioid Risk Score:   Fall Risk Score:  `1  Depression screen PHQ 2/9  Dep[facility] screen [facility] 2/9 08/03/2017 03/09/2016  Decreased Interest 0 0  Down, Depressed, Hopeless 0 0  PHQ - 2 Score 0 0     Review of Systems  Constitutional: Positive for unexpected weight  change.  HENT: Negative.   Eyes: Negative.   Respiratory: Negative.   Cardiovascular: Negative.   Gastrointestinal: Negative.   Endocrine: Negative.   Genitourinary: Negative.   Musculoskeletal: Negative.   Skin: Negative.   Allergic/Immunologic: Negative.   Neurological: Negative.   Hematological: Negative.   Psychiatric/Behavioral: Negative.        Objective:   Physical Exam  General: Alert and oriented x 3, No apparent distress  HEENT:Head is normocephalic, atraumatic, PERRLA, EOMI, sclera anicteric, oral mucosa [name] and moist, dentition intact, ext ear canals clear,  Neck:Supple without JVD or lymphadenopathy  Heart:Regular rate Chest:Clear  Abdomen:Soft, non-tender, non-distended, bowel sounds positive.  Extremities:No clubbing, cyanosis, or edema. Pulses are 2+  Skin:Clean and intact without signs of breakdown  Neuro:Pt is cognitively appropriate with normal insight, memory, and awareness. Cranial nerves 2-12 are intact. Sensory exam is 1/2. Reflexes are 2++ on the left. LLE is grossly 4/5. LUE is 4 prox at deltoid, biceps,  3+triceps, trace 1+ with FF, 1 /5 with WF,WE. He has mild flexion contractures in the left index, middle and 5th fingers.  Motor exam is unchanged.  He is wearing a left wrist splint today as usual. Musculoskeletal:  Ongoing severe valgus deformity at the right knee which accentuates with weightbearing.  Slight deformity of the left knee as well comparatively.   Psych:Pt's affect is appropriate. Pt is cooperative     1. Cervical myelopathy. Status post C2-C7 laminectomy and fusion 01/04/2012 with postoperative epidural hematoma evacuation 01/09/2012  -tramadol for breakthrough pain---RF today via mail order -resting wrist hand orthosis.  -wrote rx for right SPICA splint again today    2. OA of right knee  -After informed consent and preparation of the skin with betadine and isopropyl alcohol, I injected 6mg  (1cc) of celestone and  4cc of 1% lidocaine into  the right knee via anterolateral approach. Additionally, aspiration was performed prior to injection. The patient tolerated well, and no complications were encountered. Afterward the area was cleaned and dressed. Post- injection instructions were provided.    - he needs to consider knee replacement but he's resistant given his past surgical history  -wrote rx for hinged knee brace right side 5. Hypertension. Good control  6. Diabetes mellitus. Continue home control  7.  Erectile dysfunction: Gave the patient prescription for Viagra 100 mg #10 no refill 8. Neurogenic bowel: bowel regimen remains appropriate  9. I'lll see him back in about 4-5 months. 15 minutes of face to face patient care time were spent during this visit. All questions were encouraged and answered.

## 2017-11-22 ENCOUNTER — Other Ambulatory Visit: Payer: Self-pay | Admitting: *Deleted

## 2017-11-22 ENCOUNTER — Telehealth: Payer: Self-pay | Admitting: *Deleted

## 2017-11-22 DIAGNOSIS — M4712 Other spondylosis with myelopathy, cervical region: Secondary | ICD-10-CM

## 2017-11-22 DIAGNOSIS — M1711 Unilateral primary osteoarthritis, right knee: Secondary | ICD-10-CM

## 2017-11-22 MED ORDER — TRAMADOL HCL 50 MG PO TABS
50.0000 mg | ORAL_TABLET | Freq: Four times a day (QID) | ORAL | 0 refills | Status: AC | PRN
Start: 2017-11-22 — End: ?

## 2017-11-22 NOTE — Telephone Encounter (Signed)
error 

## 2018-01-02 ENCOUNTER — Encounter: Payer: Medicare HMO | Admitting: Physical Medicine & Rehabilitation

## 2018-01-10 ENCOUNTER — Encounter: Payer: Self-pay | Admitting: Physical Medicine & Rehabilitation

## 2018-01-10 ENCOUNTER — Encounter: Payer: Medicare HMO | Attending: Physical Medicine & Rehabilitation | Admitting: Physical Medicine & Rehabilitation

## 2018-01-10 VITALS — BP 132/76 | HR 58 | Ht 72.0 in | Wt 180.0 lb

## 2018-01-10 DIAGNOSIS — G8250 Quadriplegia, unspecified: Secondary | ICD-10-CM | POA: Diagnosis not present

## 2018-01-10 DIAGNOSIS — E119 Type 2 diabetes mellitus without complications: Secondary | ICD-10-CM | POA: Insufficient documentation

## 2018-01-10 DIAGNOSIS — Z5181 Encounter for therapeutic drug level monitoring: Secondary | ICD-10-CM | POA: Diagnosis not present

## 2018-01-10 DIAGNOSIS — M4322 Fusion of spine, cervical region: Secondary | ICD-10-CM | POA: Diagnosis not present

## 2018-01-10 DIAGNOSIS — Z87891 Personal history of nicotine dependence: Secondary | ICD-10-CM | POA: Insufficient documentation

## 2018-01-10 DIAGNOSIS — Z96642 Presence of left artificial hip joint: Secondary | ICD-10-CM | POA: Diagnosis not present

## 2018-01-10 DIAGNOSIS — G709 Myoneural disorder, unspecified: Secondary | ICD-10-CM | POA: Insufficient documentation

## 2018-01-10 DIAGNOSIS — I10 Essential (primary) hypertension: Secondary | ICD-10-CM | POA: Insufficient documentation

## 2018-01-10 DIAGNOSIS — N529 Male erectile dysfunction, unspecified: Secondary | ICD-10-CM | POA: Diagnosis not present

## 2018-01-10 DIAGNOSIS — M1711 Unilateral primary osteoarthritis, right knee: Secondary | ICD-10-CM

## 2018-01-10 DIAGNOSIS — E7800 Pure hypercholesterolemia, unspecified: Secondary | ICD-10-CM | POA: Diagnosis not present

## 2018-01-10 DIAGNOSIS — K592 Neurogenic bowel, not elsewhere classified: Secondary | ICD-10-CM | POA: Insufficient documentation

## 2018-01-10 DIAGNOSIS — Z79899 Other long term (current) drug therapy: Secondary | ICD-10-CM | POA: Diagnosis not present

## 2018-01-10 NOTE — Progress Notes (Signed)
Subjective:    Patient ID: [name], male    DOB: 07/24/1945, 72 y.o.   MRN: [id number]  HPI   [name] is here in follow up his cervical spinal cord injury and associated gait deficits and pain.  He has had increasing right knee pain once again.  The injections we have done seem to help for several months.  He still is hesitant to pursue knee replacement surgery at this time.  He did not get the brace for his left wrist as apparently the place that he was going to go "went out of business".  He is requesting another prescription for a wrist brace today.  He is using a walker for balance.  Denies any falls or new medical problems.  He is using tramadol for severe pain. Pain Inventory Average Pain 6 Pain Right Now 6 My pain is sharp, dull and aching  In the last 24 hours, has pain interfered with the following? General activity 6 Relation with others 4 Enjoyment of life 8 What TIME of day is your pain at its worst? night Sleep (in general) Fair  Pain is worse with: walking and bending Pain improves with: rest, heat/ice, medication and injections Relief from Meds: 7  Mobility walk with assistance use a cane use a walker ability to climb steps?  yes do you drive?  yes  Function retired  Neuro/Psych trouble walking  Prior Studies Any changes since last visit?  no  Physicians involved in your care Any changes since last visit?  no   Family History  Problem Relation Age of Onset  . Anesthesia problems Neg Hx    Social History   Socioeconomic History  . Marital status: Legally Separated    Spouse name: Not on file  . Number of children: Not on file  . Years of education: Not on file  . Highest education level: Not on file  Occupational History  . Not on file  Social Needs  . Financial resource strain: Not on file  . Food insecurity:    Worry: Not on file    Inability: Not on file  . Transportation needs:    Medical: Not on file    Non-medical: Not on  file  Tobacco Use  . Smoking status: Former Smoker    Packs/day: 1.00    Years: 35.00    Pack years: 35.00    Types: Cigarettes    Last attempt to quit: 07/19/1988    Years since quitting: 29.4  . Smokeless tobacco: Former Engineer, waterUser  Substance and Sexual Activity  . Alcohol use: Yes    Comment: None since January 21 1979  . Drug use: No  . Sexual activity: Not Currently  Lifestyle  . Physical activity:    Days per week: Not on file    Minutes per session: Not on file  . Stress: Not on file  Relationships  . Social connections:    Talks on phone: Not on file    Gets together: Not on file    Attends religious service: Not on file    Active member of club or organization: Not on file    Attends meetings of clubs or organizations: Not on file    Relationship status: Not on file  Other Topics Concern  . Not on file  Social History Narrative  . Not on file   Past Surgical History:  Procedure Laterality Date  . ANTERIOR CERVICAL DECOMP/DISCECTOMY FUSION  2008  . EXCISIONAL HEMORRHOIDECTOMY  1970's  .  INGUINAL HERNIA REPAIR  ~ 1975   left  . JOINT REPLACEMENT     Left Hip Replacement  . POSTERIOR CERVICAL LAMINECTOMY  01/04/2012   Procedure: POSTERIOR CERVICAL LAMINECTOMY;  Surgeon: Clydene Fake, MD;  Location: [facility];  Service: Neurosurgery;  Laterality: N/A;  Cervical Two to Thoracic One2  Decompressive Laminectomy, Fusion Cervical Two to Thoracic One with Instrumentation  . POSTERIOR CERVICAL LAMINECTOMY  01/09/2012   Procedure: POSTERIOR CERVICAL LAMINECTOMY;  Surgeon: Hewitt Shorts, MD;  Location: [facility];  Service: Neurosurgery;  Laterality: N/A;  Evacuation of Epidural Hematoma  . POSTERIOR FUSION CERVICAL SPINE  01/04/12  . TOTAL HIP ARTHROPLASTY  1999   left   Past Medical History:  Diagnosis Date  . Arthritis    "right knee; left hip"  . High cholesterol   . Hypertension   . Neuromuscular disorder (HCC)   . Type II diabetes mellitus (HCC)    There were  no vitals taken for this visit.  Opioid Risk Score:   Fall Risk Score:  `1  Depression screen PHQ 2/9  Depression screen [facility] 2/9 08/03/2017 03/09/2016  Decreased Interest 0 0  Down, Depressed, Hopeless 0 0  PHQ - 2 Score 0 0     Review of Systems  Constitutional: Negative.   HENT: Negative.   Eyes: Negative.   Respiratory: Negative.   Cardiovascular: Negative.   Gastrointestinal: Negative.   Endocrine: Negative.   Genitourinary: Negative.   Musculoskeletal: Positive for arthralgias, gait problem and myalgias.  Skin: Negative.   Allergic/Immunologic: Negative.   Hematological: Negative.   Psychiatric/Behavioral: Negative.   All other systems reviewed and are negative.      Objective:   Physical Exam  General: No acute distress HEENT: EOMI, oral membranes moist Cards: reg rate  Chest: normal effort Abdomen: Soft, NT, ND Skin: dry, intact Extremities: no edema Neuro:Pt is cognitively appropriate with normal insight, memory, and awareness. Cranial nerves 2-12 are intact. Sensory exam is 1/2. Reflexes are 2++ on the left. LLE is grossly 4/5. LUE is 4 prox at deltoid, biceps, 3+triceps, trace 1+ with FF, 1 /5 with WF,WE. He has mildflexion contractures in the left index,middle and 5th fingers.  motor exam is stable.  He is wearing a left wrist splint today .  Walker for balance. Musculoskeletal: ongoing valgus deformity right knee. Pain with weight bearing.   Psych:pleasant    1. Cervical myelopathy. Status post C2-C7 laminectomy and fusion 01/04/2012 with postoperative epidural hematoma evacuation 01/09/2012  -tramadol for breakthrough pain--no RF today.   We will continue the controlled substance monitoring program, this consists of regular clinic visits, examinations, routine drug screening, pill counts as well as use of [place] Controlled Substance Reporting System. [place] today.   -UDS today  2. OA of right knee  -After informed  consent and preparation of the skin with betadine and isopropyl alcohol, I injected 6mg  (1cc) of celestone and 4cc of 1% lidocaine into the right knee via anterolateral approach. Additionally, aspiration was performed prior to injection. The patient tolerated well, and no complications were encountered. Afterward the area was cleaned and dressed. Post- injection instructions were provided.    - discussed knee replacementbut he's resistant given his past surgical history smart Smart. -consider zilretta injection at next visit 5.  Wrote him another prescription for left wrist splint today.  He will go to [facility] for this. 6. Diabetes mellitus. Continue home control  7.  Erectile dysfunction: Viagra per primary  6 months 8. Neurogenic bowel: bowel regimen remains appropriate  9. I'lll see him back in about6 months. 15 minutes of face to face patient care time were spent during this visit. All questions were encouraged and answered.

## 2018-01-10 NOTE — Patient Instructions (Addendum)
PLEASE FEEL FREE TO CALL OUR OFFICE WITH ANY PROBLEMS OR QUESTIONS ([phone])    SUPPLEMENTS USEFUL FOR OSTEOARTHRITIS: OMEGA 3 FATTY ACIDS, TURMERIC, GINGER, TART CHERRY EXTRACT, CELERY SEED, GLUCOSAMINE WITH CHONDROITIN     

## 2018-01-14 LAB — TOXASSURE SELECT,+ANTIDEPR,UR

## 2018-01-15 ENCOUNTER — Other Ambulatory Visit: Payer: Self-pay | Admitting: Physical Medicine & Rehabilitation

## 2018-01-15 DIAGNOSIS — M1711 Unilateral primary osteoarthritis, right knee: Secondary | ICD-10-CM

## 2018-01-15 DIAGNOSIS — M4712 Other spondylosis with myelopathy, cervical region: Secondary | ICD-10-CM

## 2018-01-16 ENCOUNTER — Telehealth: Payer: Self-pay | Admitting: *Deleted

## 2018-01-16 NOTE — Telephone Encounter (Signed)
Urine drug screen is positive for marijuana metabolite THC as well as Tramadol.

## 2018-01-18 ENCOUNTER — Telehealth: Payer: Self-pay | Admitting: *Deleted

## 2018-01-18 NOTE — Telephone Encounter (Signed)
Mr [name] called about getting refill on his tramadol.  Dr Riley KillSwartz is out of the office until 01/23/18 and his UDS was +THC, so we have to wait for Dr Riley KillSwartz to return to determine if we can refill.  Mr [name] notified. His reply was he tried "one of those pills someone gave him and he guesses that must have done it".

## 2018-01-23 ENCOUNTER — Telehealth: Payer: Self-pay

## 2018-01-23 NOTE — Telephone Encounter (Signed)
Pt called stating UDS positive for Marijuana. What to do about medication?

## 2018-01-23 NOTE — Telephone Encounter (Signed)
Non-narcotic management only from here on out.

## 2018-01-24 NOTE — Telephone Encounter (Signed)
[name] notified of non narcotic status and that we will no longer prescribe the Tramadol for him. He can pursue with his PCP.

## 2018-01-24 NOTE — Telephone Encounter (Signed)
Mr [name] notified we will no longer prescribe tramadol and he can pursue with his PCP. (call recorded under drug/alcohol assessment message).

## 2018-07-04 ENCOUNTER — Encounter: Payer: Medicare HMO | Admitting: Physical Medicine & Rehabilitation

## 2019-09-08 ENCOUNTER — Ambulatory Visit: Payer: Medicare HMO | Attending: Internal Medicine

## 2019-09-08 DIAGNOSIS — Z23 Encounter for immunization: Secondary | ICD-10-CM | POA: Insufficient documentation

## 2019-09-08 NOTE — Progress Notes (Signed)
   Covid-19 Vaccination Clinic  Name:  [name]    MRN: [id number] DOB: 24-Oct-1945  09/08/2019  [name] was observed post Covid-19 immunization for 15 minutes without incidence. He was provided with Vaccine Information Sheet and instruction to access the V-Safe system.   [name] was instructed to call 911 with any severe reactions post vaccine: Marland Kitchen Difficulty breathing  . Swelling of your face and throat  . A fast heartbeat  . A bad rash all over your body  . Dizziness and weakness    Immunizations Administered    Name Date Dose VIS Date Route   Pfizer COVID-19 Vaccine 09/08/2019  2:20 PM 0.3 mL 06/29/2019 Intramuscular   Manufacturer: [employer], [employer]   Lot: [id number]   NDC: [address]-0

## 2019-10-02 ENCOUNTER — Ambulatory Visit: Payer: Medicare HMO | Attending: Internal Medicine

## 2019-10-02 DIAGNOSIS — Z23 Encounter for immunization: Secondary | ICD-10-CM

## 2019-10-02 NOTE — Progress Notes (Signed)
   Covid-19 Vaccination Clinic  Name:  MARVON SHILLINGBURG    MRN: [id number] DOB: 10-22-1945  10/02/2019  Mr. Sharpley was observed post Covid-19 immunization for 30 minutes based on pre-vaccination screening without incident. He was provided with Vaccine Information Sheet and instruction to access the V-Safe system.   Mr. Farrelly was instructed to call 911 with any severe reactions post vaccine: Marland Kitchen Difficulty breathing  . Swelling of face and throat  . A fast heartbeat  . A bad rash all over body  . Dizziness and weakness   Immunizations Administered    Name Date Dose VIS Date Route   Pfizer COVID-19 Vaccine 10/02/2019  1:24 PM 0.3 mL 06/29/2019 Intramuscular   Manufacturer: [employer], [employer]   Lot: [id number]   NDC: [address]-3

## 2020-10-16 ENCOUNTER — Encounter: Payer: Self-pay | Admitting: Podiatry

## 2020-10-16 ENCOUNTER — Ambulatory Visit: Payer: Medicare HMO | Admitting: Podiatry

## 2020-10-16 ENCOUNTER — Other Ambulatory Visit: Payer: Self-pay

## 2020-10-16 DIAGNOSIS — L600 Ingrowing nail: Secondary | ICD-10-CM

## 2020-10-16 NOTE — Progress Notes (Signed)
Subjective:   Patient ID: [name], male   DOB: 75 y.o.   MRN: [id number]   HPI Patient presents with a painful ingrown toenail left big toe stating he has tried to trim and soak it without relief but it is hard to do due to his physical condition with no use of his left hand.  Patient is on a walker he does not smoke he likes to be active and states this is tender for him but is not noted drainage   Review of Systems  All other systems reviewed and are negative.       Objective:  Physical Exam Vitals and nursing note reviewed.  Constitutional:      Appearance: He is well-developed.  Pulmonary:     Effort: Pulmonary effort is normal.  Musculoskeletal:        General: Normal range of motion.  Skin:    General: Skin is warm.  Neurological:     Mental Status: He is alert.     Neurovascular status found to be intact muscle strength was found to be adequate range was found to be adequate with moderate diminishment of the extensor flexor function.  Patient is found to have incurvated left hallux medial border painful when pressed no active drainage no redness noted with pain upon palpation to the nailbed.  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  Explained procedure risk patient wants surgery understanding risk of healing and today I infiltrated 60 mg like Marcaine mixture sterile prep done sterile instrumentation used and remove the medial border exposed matrix applied phenol 3 applications 30 seconds followed by alcohol lavage sterile dressing and gave instructions on soaks and to leave dressing on 24 hours but take it off earlier if any throbbing were to occur and encouraged him to call questions concerns which may arise

## 2020-10-16 NOTE — Patient Instructions (Signed)

## 2020-10-17 ENCOUNTER — Telehealth: Payer: Self-pay | Admitting: Podiatry

## 2020-10-17 NOTE — Telephone Encounter (Signed)
Patient called and stated that he would like to know what type of ointment to put on his toe. He had a nail removed in office yesterday with Dr. Charlsie Merles. Patient also stated that he did have some pain last night and couldn't sleep. He wanted to know if something can be prescribed for the pain.

## 2020-10-17 NOTE — Telephone Encounter (Signed)
Pt is experiencing a lot of pain from ingrown removal. He states it kept him up at night and would like something to take for it. Please advise.

## 2020-10-20 ENCOUNTER — Telehealth: Payer: Self-pay

## 2020-10-20 ENCOUNTER — Telehealth: Payer: Self-pay | Admitting: Podiatry

## 2020-10-20 MED ORDER — NEOMYCIN-POLYMYXIN-HC 3.5-10000-1 OT SUSP: OTIC | 0 refills | Status: AC

## 2020-10-20 NOTE — Telephone Encounter (Signed)
Patient called into our office today stating Dr.regal called in a prescription that he can not afford and wants to know if there is another medication that can be prescribed.

## 2020-10-20 NOTE — Telephone Encounter (Signed)
Pt called again regarding a medication dressing for his surgical wound. Please advise. This message was send to Dr. Logan Bores on 10/17/20.

## 2020-10-20 NOTE — Telephone Encounter (Signed)
Thank you. LVM for patient, letting him know that a prescription was called in for him. If he had any additional question to give the office a call back.

## 2020-10-21 NOTE — Telephone Encounter (Signed)
He should just use triple antibiotic ointment then

## 2020-10-21 NOTE — Telephone Encounter (Signed)
There is not a good alternative Rx medication. May want to schedule him a follow up nail check next with Dr Charlsie Merles if he still has issues

## 2020-10-21 NOTE — Telephone Encounter (Signed)
Patient called into our office today stating Dr.regal called in a prescription that he can not afford and wants to know if there is another medication that can be prescribed.  

## 2020-10-22 NOTE — Telephone Encounter (Signed)
Spoke with patient. I did let the patient know to continue to use the triple antibiotic ointment and if he feels like he needs to schedule an appointment he can call back to follow up with Dr. Charlsie Merles.
